# Patient Record
Sex: Female | Born: 1965 | Race: White | Hispanic: No | Smoking: Former smoker
Health system: Southern US, Community
[De-identification: ages and names within clinical notes are randomized; demographics above are authoritative.]

## PROBLEM LIST (undated history)

## (undated) DIAGNOSIS — Z9889 Other specified postprocedural states: Secondary | ICD-10-CM

## (undated) DIAGNOSIS — E7800 Pure hypercholesterolemia, unspecified: Secondary | ICD-10-CM

## (undated) DIAGNOSIS — I10 Essential (primary) hypertension: Secondary | ICD-10-CM

## (undated) DIAGNOSIS — M1990 Unspecified osteoarthritis, unspecified site: Secondary | ICD-10-CM

## (undated) DIAGNOSIS — R112 Nausea with vomiting, unspecified: Secondary | ICD-10-CM

## (undated) DIAGNOSIS — K635 Polyp of colon: Secondary | ICD-10-CM

## (undated) DIAGNOSIS — Z8669 Personal history of other diseases of the nervous system and sense organs: Secondary | ICD-10-CM

## (undated) DIAGNOSIS — Z8719 Personal history of other diseases of the digestive system: Secondary | ICD-10-CM

## (undated) DIAGNOSIS — F32A Depression, unspecified: Secondary | ICD-10-CM

## (undated) DIAGNOSIS — K219 Gastro-esophageal reflux disease without esophagitis: Secondary | ICD-10-CM

## (undated) DIAGNOSIS — Z87898 Personal history of other specified conditions: Secondary | ICD-10-CM

## (undated) DIAGNOSIS — T7840XA Allergy, unspecified, initial encounter: Secondary | ICD-10-CM

## (undated) DIAGNOSIS — B019 Varicella without complication: Secondary | ICD-10-CM

## (undated) DIAGNOSIS — Z87440 Personal history of urinary (tract) infections: Secondary | ICD-10-CM

## (undated) HISTORY — DX: Personal history of other diseases of the nervous system and sense organs: Z86.69

## (undated) HISTORY — PX: ABDOMINAL HYSTERECTOMY: SHX81

## (undated) HISTORY — DX: Personal history of other specified conditions: Z87.898

## (undated) HISTORY — DX: Essential (primary) hypertension: I10

## (undated) HISTORY — DX: Allergy, unspecified, initial encounter: T78.40XA

## (undated) HISTORY — DX: Personal history of urinary (tract) infections: Z87.440

## (undated) HISTORY — DX: Depression, unspecified: F32.A

## (undated) HISTORY — DX: Pure hypercholesterolemia, unspecified: E78.00

## (undated) HISTORY — DX: Personal history of other diseases of the digestive system: Z87.19

## (undated) HISTORY — PX: APPENDECTOMY: SHX54

## (undated) HISTORY — DX: Varicella without complication: B01.9

## (undated) HISTORY — DX: Polyp of colon: K63.5

## (undated) HISTORY — DX: Unspecified osteoarthritis, unspecified site: M19.90

---

## 1995-07-21 HISTORY — PX: PARTIAL HYSTERECTOMY: SHX80

## 1998-07-20 HISTORY — PX: REDUCTION MAMMAPLASTY: SUR839

## 1998-07-20 HISTORY — PX: BREAST REDUCTION SURGERY: SHX8

## 2005-04-13 ENCOUNTER — Emergency Department: Payer: Self-pay | Admitting: Emergency Medicine

## 2005-04-14 ENCOUNTER — Emergency Department: Payer: Self-pay | Admitting: Emergency Medicine

## 2006-04-21 ENCOUNTER — Emergency Department: Payer: Self-pay | Admitting: Internal Medicine

## 2013-11-23 ENCOUNTER — Encounter: Payer: Self-pay | Admitting: Family Medicine

## 2013-11-23 ENCOUNTER — Encounter: Payer: Self-pay | Admitting: *Deleted

## 2013-11-23 ENCOUNTER — Ambulatory Visit (INDEPENDENT_AMBULATORY_CARE_PROVIDER_SITE_OTHER): Payer: Self-pay | Admitting: Family Medicine

## 2013-11-23 VITALS — BP 132/86 | HR 80 | Temp 98.6°F | Ht 64.75 in | Wt 177.2 lb

## 2013-11-23 DIAGNOSIS — Z1231 Encounter for screening mammogram for malignant neoplasm of breast: Secondary | ICD-10-CM

## 2013-11-23 DIAGNOSIS — L989 Disorder of the skin and subcutaneous tissue, unspecified: Secondary | ICD-10-CM

## 2013-11-23 DIAGNOSIS — R635 Abnormal weight gain: Secondary | ICD-10-CM

## 2013-11-23 DIAGNOSIS — R5381 Other malaise: Secondary | ICD-10-CM

## 2013-11-23 DIAGNOSIS — R5383 Other fatigue: Secondary | ICD-10-CM

## 2013-11-23 DIAGNOSIS — K5900 Constipation, unspecified: Secondary | ICD-10-CM | POA: Insufficient documentation

## 2013-11-23 LAB — CBC WITH DIFFERENTIAL/PLATELET
Basophils Absolute: 0 10*3/uL (ref 0.0–0.1)
Basophils Relative: 0.1 % (ref 0.0–3.0)
EOS ABS: 0.2 10*3/uL (ref 0.0–0.7)
Eosinophils Relative: 2.4 % (ref 0.0–5.0)
HCT: 37.9 % (ref 36.0–46.0)
Hemoglobin: 13 g/dL (ref 12.0–15.0)
Lymphocytes Relative: 28.8 % (ref 12.0–46.0)
Lymphs Abs: 2.9 10*3/uL (ref 0.7–4.0)
MCHC: 34.2 g/dL (ref 30.0–36.0)
MCV: 91.8 fl (ref 78.0–100.0)
MONO ABS: 0.6 10*3/uL (ref 0.1–1.0)
Monocytes Relative: 5.4 % (ref 3.0–12.0)
Neutro Abs: 6.5 10*3/uL (ref 1.4–7.7)
Neutrophils Relative %: 63.3 % (ref 43.0–77.0)
PLATELETS: 364 10*3/uL (ref 150.0–400.0)
RBC: 4.12 Mil/uL (ref 3.87–5.11)
RDW: 12.7 % (ref 11.5–15.5)
WBC: 10.2 10*3/uL (ref 4.0–10.5)

## 2013-11-23 LAB — VITAMIN B12: Vitamin B-12: 548 pg/mL (ref 211–911)

## 2013-11-23 LAB — COMPREHENSIVE METABOLIC PANEL
ALBUMIN: 3.8 g/dL (ref 3.5–5.2)
ALK PHOS: 53 U/L (ref 39–117)
ALT: 25 U/L (ref 0–35)
AST: 18 U/L (ref 0–37)
BUN: 16 mg/dL (ref 6–23)
CO2: 27 mEq/L (ref 19–32)
Calcium: 9.3 mg/dL (ref 8.4–10.5)
Chloride: 103 mEq/L (ref 96–112)
Creatinine, Ser: 0.9 mg/dL (ref 0.4–1.2)
GFR: 71.72 mL/min (ref >60.00)
Glucose, Bld: 91 mg/dL (ref 70–99)
Potassium: 4.1 mEq/L (ref 3.5–5.1)
SODIUM: 136 meq/L (ref 135–145)
TOTAL PROTEIN: 7.1 g/dL (ref 6.0–8.3)
Total Bilirubin: 0.5 mg/dL (ref 0.2–1.2)

## 2013-11-23 LAB — TSH: TSH: 1.04 u[IU]/mL (ref 0.35–4.50)

## 2013-11-23 LAB — T4, FREE: Free T4: 0.7 ng/dL (ref 0.60–1.60)

## 2013-11-23 NOTE — Progress Notes (Signed)
Subjective:   Patient ID: Judy Huang, female    DOB: December 30, 1965, 48 y.o.   MRN: [id number]  Judy Huang is a pleasant 48 y.o. year old female who presents to clinic today with [place]  on 11/23/2013  HPI: Has several concerns today.  Constipation- past 6 months, having progressive constipation. Has to take a laxative to have a BM- would go 10 days if didn't take anything. Feels bloated all the time. No blood in stool, no dark stools. +nausea, no vomiting.  GERD- h/o reflux.  Nexium and tums not helping.  Weight gain- past 6 months, has gained 30 pounds.  Has not changed her diet. +fatigue, constipation, + dry skin No known family h/o thyroid.  Since she does not have insurance, has not seen a doctor in years. Mammogram over 7 years ago, per pt.  Patient Active Problem List   Diagnosis Date Noted  . Unspecified constipation 11/23/2013  . Weight gain 11/23/2013  . Other malaise and fatigue 11/23/2013  . Skin lesion of face 11/23/2013   Past Medical History  Diagnosis Date  . History of headache   . History of gastroesophageal reflux (GERD)   . History of migraine   . History of UTI    Past Surgical History  Procedure Laterality Date  . Breast reduction surgery  2000  . Partial hysterectomy  1997   History  Substance Use Topics  . Smoking status: Former Smoker    Start date: 07/26/2013  . Smokeless tobacco: Never Used  . Alcohol Use: Yes     Comment: socially (once a month)   Family History  Problem Relation Age of Onset  . Arthritis Mother   . Arthritis Father   . Arthritis Maternal Grandfather   . Arthritis Maternal Grandmother   . Lung cancer Maternal Grandfather   . Hyperlipidemia Father   . Stroke Father   . High blood pressure Father   . High blood pressure Maternal Grandmother   . Diabetes Father   . Cancer Maternal Grandmother   . Cancer Maternal Uncle    No Known Allergies No current outpatient prescriptions on file prior to  visit.   No current facility-administered medications on file prior to visit.   The PMH, PSH, Social History, Family History, Medications, and allergies have been reviewed in [facility], and have been updated if relevant.  Review of Systems See HPI +dry hair +cracked nails No difficulty swallowing   +fatigue Objective:    BP 132/86  Pulse 80  Temp(Src) 98.6 F (37 C) (Oral)  Ht 5' 4.75" (1.645 m)  Wt 177 lb 4 oz (80.4 kg)  BMI 29.71 kg/m2  SpO2 96%   Physical Exam   General:  Well-developed,well-nourished,in no acute distress; alert,appropriate and cooperative throughout examination Head:  normocephalic and atraumatic.   Eyes:  vision grossly intact, pupils equal, pupils round, and pupils reactive to light.   Ears:  R ear normal and L ear normal.   Nose:  no external deformity.   Mouth:  good dentition.   Neck:  No deformities, masses, or tenderness noted. Lungs:  Normal respiratory effort, chest expands symmetrically. Lungs are clear to auscultation, no crackles or wheezes. Heart:  Normal rate and regular rhythm. S1 and S2 normal without gallop, murmur, click, rub or other extra sounds. Abdomen:  Bowel sounds positive,abdomen soft and non-tender without masses, organomegaly or hernias noted. Msk:  No deformity or scoliosis noted of thoracic or lumbar spine.   Extremities:  No clubbing, cyanosis, edema,  or deformity noted with normal full range of motion of all joints.   Neurologic:  alert & oriented X3 and gait normal.   Skin:  Bleeding elevated lesion, left temple Cervical Nodes:  No lymphadenopathy noted Axillary Nodes:  No palpable lymphadenopathy Psych:  Cognition and judgment appear intact. Alert and cooperative with normal attention span and concentration. No apparent delusions, illusions, hallucinations       Assessment & Plan:   Unspecified constipation  Weight gain No Follow-up on file.

## 2013-11-23 NOTE — Assessment & Plan Note (Signed)
With weight gain and other symptoms of hypothyroidism. Will check labs today. Orders Placed This Encounter  Procedures  . MM Digital Screening  . CBC with Differential  . Helicobacter pylori antigen det, stool  . T4 AND TSH  . Comprehensive metabolic panel  . Vitamin B12  . Ambulatory referral to Dermatology

## 2013-11-23 NOTE — Assessment & Plan Note (Signed)
?  hypothyroidism but will also advise several dietary changes for constipation/IBS- See AVS. Will also check stool for H pylori given associated malaise and GERD.

## 2013-11-23 NOTE — Addendum Note (Signed)
Addended by: Ellamae Sia on: 11/23/2013 02:46 PM   Modules accepted: Orders

## 2013-11-23 NOTE — Patient Instructions (Signed)
Great to see you.  Please go to front desk and let them know you need to set up a referral or they will call if you if this is not an urgent referral.  Either MARION or LINDA will help you set it up.  If it is between 1-2 PM they may be at lunch.   I will call you with your lab results.  Try minimizing stress and foods that worsen symptoms.   Increase SOLUBLE fiber (from food or supplements (psyllium, etc)-These absorb water and may help either constipation or diarrhea.  Try over the counter gas-x (four times a day when necessary) or beano for bloating.  Peppermint oil can sometimes help with symptoms as well.  Common culprits are hard-to-digest carbs called "FODMAPs"...fermentable, oligo-, di-, and mono-saccharides and polyols...in some fruits, veggies, milk, grains, etc.  Exclude gas producing foods (beans, onions, celery, carrots, raisins, bananas, apricots, prunes, brussel sprouts, wheat germ, pretzels)   Consider trail of lactose free diet (back off milk)   Trial of Align or VSL #3 for bloating/IBS symptoms (OTC).

## 2013-11-23 NOTE — Progress Notes (Signed)
Pre visit review using our clinic review tool, if applicable. No additional management support is needed unless otherwise documented below in the visit note. 

## 2013-11-23 NOTE — Assessment & Plan Note (Signed)
Concerning for SCC. Refer to derm.

## 2013-11-28 ENCOUNTER — Other Ambulatory Visit: Payer: Self-pay | Admitting: Family Medicine

## 2013-11-28 NOTE — Addendum Note (Signed)
Addended by: Ellamae Sia on: 11/28/2013 12:26 PM   Modules accepted: Orders

## 2013-11-28 NOTE — Addendum Note (Signed)
Addended by: Ellamae Sia on: 11/28/2013 09:05 AM   Modules accepted: Orders

## 2013-11-29 ENCOUNTER — Other Ambulatory Visit: Payer: Self-pay | Admitting: Family Medicine

## 2013-11-29 DIAGNOSIS — K5900 Constipation, unspecified: Secondary | ICD-10-CM

## 2013-11-29 LAB — HELICOBACTER PYLORI  SPECIAL ANTIGEN: H. PYLORI Antigen: NEGATIVE

## 2014-01-17 ENCOUNTER — Telehealth: Payer: Self-pay | Admitting: Family Medicine

## 2014-01-17 NOTE — Telephone Encounter (Signed)
Have called the patient 3 times to help get her Colonoscopy scheduled. She doesn't want [name] to call her anymore, she has had surgery recently and cant afford to have the Colonoscopy at this time. She will call us when ready to schedule.

## 2015-04-24 ENCOUNTER — Other Ambulatory Visit: Payer: Self-pay | Admitting: Family Medicine

## 2015-04-24 DIAGNOSIS — Z01419 Encounter for gynecological examination (general) (routine) without abnormal findings: Secondary | ICD-10-CM | POA: Insufficient documentation

## 2015-05-01 ENCOUNTER — Other Ambulatory Visit (INDEPENDENT_AMBULATORY_CARE_PROVIDER_SITE_OTHER): Payer: Self-pay

## 2015-05-01 DIAGNOSIS — Z0000 Encounter for general adult medical examination without abnormal findings: Secondary | ICD-10-CM

## 2015-05-01 DIAGNOSIS — Z01419 Encounter for gynecological examination (general) (routine) without abnormal findings: Secondary | ICD-10-CM

## 2015-05-01 LAB — COMPREHENSIVE METABOLIC PANEL
ALBUMIN: 4 g/dL (ref 3.5–5.2)
ALK PHOS: 57 U/L (ref 39–117)
ALT: 16 U/L (ref 0–35)
AST: 14 U/L (ref 0–37)
BUN: 14 mg/dL (ref 6–23)
CO2: 27 mEq/L (ref 19–32)
Calcium: 9.7 mg/dL (ref 8.4–10.5)
Chloride: 106 mEq/L (ref 96–112)
Creatinine, Ser: 0.78 mg/dL (ref 0.40–1.20)
GFR: 83.02 mL/min (ref >60.00)
Glucose, Bld: 93 mg/dL (ref 70–99)
Potassium: 4.4 mEq/L (ref 3.5–5.1)
Sodium: 138 mEq/L (ref 135–145)
TOTAL PROTEIN: 6.9 g/dL (ref 6.0–8.3)
Total Bilirubin: 0.2 mg/dL (ref 0.2–1.2)

## 2015-05-01 LAB — CBC WITH DIFFERENTIAL/PLATELET
Basophils Absolute: 0 10*3/uL (ref 0.0–0.1)
Basophils Relative: 0.5 % (ref 0.0–3.0)
EOS PCT: 2.2 % (ref 0.0–5.0)
Eosinophils Absolute: 0.2 10*3/uL (ref 0.0–0.7)
HCT: 39.7 % (ref 36.0–46.0)
HEMOGLOBIN: 13.4 g/dL (ref 12.0–15.0)
LYMPHS PCT: 27.5 % (ref 12.0–46.0)
Lymphs Abs: 2.5 10*3/uL (ref 0.7–4.0)
MCHC: 33.8 g/dL (ref 30.0–36.0)
MCV: 92 fl (ref 78.0–100.0)
Monocytes Absolute: 0.6 10*3/uL (ref 0.1–1.0)
Monocytes Relative: 6.8 % (ref 3.0–12.0)
Neutro Abs: 5.7 10*3/uL (ref 1.4–7.7)
Neutrophils Relative %: 63 % (ref 43.0–77.0)
Platelets: 341 10*3/uL (ref 150.0–400.0)
RBC: 4.32 Mil/uL (ref 3.87–5.11)
RDW: 12.7 % (ref 11.5–15.5)
WBC: 9 10*3/uL (ref 4.0–10.5)

## 2015-05-01 LAB — LIPID PANEL
CHOLESTEROL: 175 mg/dL (ref 0–200)
HDL: 40 mg/dL (ref >39.00)
LDL CALC: 117 mg/dL — AB (ref 0–99)
NonHDL: 135.34
TRIGLYCERIDES: 94 mg/dL (ref 0.0–149.0)
Total CHOL/HDL Ratio: 4
VLDL: 18.8 mg/dL (ref 0.0–40.0)

## 2015-05-01 LAB — TSH: TSH: 2.17 u[IU]/mL (ref 0.35–4.50)

## 2015-05-06 ENCOUNTER — Encounter: Payer: Self-pay | Admitting: Family Medicine

## 2015-05-06 ENCOUNTER — Ambulatory Visit (INDEPENDENT_AMBULATORY_CARE_PROVIDER_SITE_OTHER): Payer: Self-pay | Admitting: Family Medicine

## 2015-05-06 ENCOUNTER — Other Ambulatory Visit: Payer: Self-pay | Admitting: Family Medicine

## 2015-05-06 VITALS — BP 132/88 | HR 75 | Temp 98.3°F | Ht 64.75 in | Wt 170.8 lb

## 2015-05-06 DIAGNOSIS — R5383 Other fatigue: Secondary | ICD-10-CM

## 2015-05-06 DIAGNOSIS — Z01419 Encounter for gynecological examination (general) (routine) without abnormal findings: Secondary | ICD-10-CM

## 2015-05-06 DIAGNOSIS — F418 Other specified anxiety disorders: Secondary | ICD-10-CM | POA: Insufficient documentation

## 2015-05-06 DIAGNOSIS — F419 Anxiety disorder, unspecified: Secondary | ICD-10-CM | POA: Insufficient documentation

## 2015-05-06 DIAGNOSIS — F32A Depression, unspecified: Secondary | ICD-10-CM

## 2015-05-06 DIAGNOSIS — F329 Major depressive disorder, single episode, unspecified: Secondary | ICD-10-CM

## 2015-05-06 DIAGNOSIS — K5900 Constipation, unspecified: Secondary | ICD-10-CM

## 2015-05-06 DIAGNOSIS — Z0000 Encounter for general adult medical examination without abnormal findings: Secondary | ICD-10-CM

## 2015-05-06 DIAGNOSIS — Z1211 Encounter for screening for malignant neoplasm of colon: Secondary | ICD-10-CM

## 2015-05-06 MED ORDER — CITALOPRAM HYDROBROMIDE 20 MG PO TABS: 20.0000 mg | ORAL_TABLET | Freq: Every day | ORAL | Status: DC

## 2015-05-06 NOTE — Assessment & Plan Note (Signed)
New- discussed tx options. Refusing psychotherapy. Does want to try rx.  eRx sent for celexa 20 mg daily- advised ok to cut in half and take 10 mg daily for first few days. She will call me in 3-4 weeks with an update.

## 2015-05-06 NOTE — Assessment & Plan Note (Signed)
Likely depression related.

## 2015-05-06 NOTE — Progress Notes (Signed)
Pre visit review using our clinic review tool, if applicable. No additional management support is needed unless otherwise documented below in the visit note. 

## 2015-05-06 NOTE — Assessment & Plan Note (Signed)
Reviewed preventive care protocols, scheduled due services, and updated immunizations Discussed nutrition, exercise, diet, and healthy lifestyle.  Declines colonoscopy- agrees to IFOB- order entered. Mammogram order and number given to pt to call breast center to set this up.

## 2015-05-06 NOTE — Progress Notes (Signed)
Subjective:   Patient ID: Judy Huang, female    DOB: [birth year], 49 y.o.   MRN: [id number]  Judy Huang is a pleasant 49 y.o. year old female who presents to clinic today with Annual Exam; Depression; and Fatigue  on 05/06/2015  HPI:   I have not seen pt since she established care with me in 11/2013.  Remote h/o hysterectomy. Has not had a colonoscopy. Has gyn- overdue for mammogram.  ?depression- past year, worsening symptoms of depression.  More tearful, cannot concentrate and not sleeping well.  Appetite "too good."  Denies SI or HI. Has never taken anything for depression.  Symptoms started when her daughter left for college and are now worsening since her mother has been very ill- in and out of the hospital. Lab Results  Component Value Date   WBC 9.0 05/01/2015   HGB 13.4 05/01/2015   HCT 39.7 05/01/2015   MCV 92.0 05/01/2015   PLT 341.0 05/01/2015   Lab Results  Component Value Date   TSH 2.17 05/01/2015   Lab Results  Component Value Date   ALT 16 05/01/2015   AST 14 05/01/2015   ALKPHOS 57 05/01/2015   BILITOT 0.2 05/01/2015   Lab Results  Component Value Date   NA 138 05/01/2015   K 4.4 05/01/2015   CL 106 05/01/2015   CO2 27 05/01/2015   Lab Results  Component Value Date   CHOL 175 05/01/2015   HDL 40.00 05/01/2015   LDLCALC 117* 05/01/2015   TRIG 94.0 05/01/2015   CHOLHDL 4 05/01/2015   Lab Results  Component Value Date   CREATININE 0.78 05/01/2015     Patient Active Problem List   Diagnosis Date Noted  . Fatigue 05/06/2015  . Depression 05/06/2015  . Well woman exam 04/24/2015   Past Medical History  Diagnosis Date  . History of headache   . History of gastroesophageal reflux (GERD)   . History of migraine   . History of UTI    Past Surgical History  Procedure Laterality Date  . Breast reduction surgery  2000  . Partial hysterectomy  1997   Social History  Substance Use Topics  . Smoking status: Former Smoker   Start date: 07/26/2013  . Smokeless tobacco: Never Used  . Alcohol Use: Yes     Comment: socially (once a month)   Family History  Problem Relation Age of Onset  . Arthritis Mother   . Arthritis Father   . Arthritis Maternal Grandfather   . Arthritis Maternal Grandmother   . Lung cancer Maternal Grandfather   . Hyperlipidemia Father   . Stroke Father   . High blood pressure Father   . High blood pressure Maternal Grandmother   . Diabetes Father   . Cancer Maternal Grandmother   . Cancer Maternal Uncle    No Known Allergies Current Outpatient Prescriptions on File Prior to Visit  Medication Sig Dispense Refill  . BIOTIN PO Take by mouth 2 (two) times a week.    . Multiple Vitamin (MULTIVITAMIN) capsule Take 1 capsule by mouth daily.     No current facility-administered medications on file prior to visit.   The PMH, PSH, Social History, Family History, Medications, and allergies have been reviewed in [facility], and have been updated if relevant.  Review of Systems  Constitutional: Positive for fatigue and unexpected weight change.  HENT: Negative.   Eyes: Negative.   Respiratory: Negative.   Cardiovascular: Negative.   Gastrointestinal: Negative.   Endocrine:  Negative.   Genitourinary: Negative.   Musculoskeletal: Negative.   Skin: Negative.   Allergic/Immunologic: Negative.   Neurological: Negative.   Psychiatric/Behavioral: Positive for sleep disturbance, dysphoric mood and decreased concentration. Negative for suicidal ideas, hallucinations, behavioral problems, self-injury and agitation. The patient is nervous/anxious. The patient is not hyperactive.   All other systems reviewed and are negative.   Objective:    BP 132/88 mmHg  Pulse 75  Temp(Src) 98.3 F (36.8 C) (Oral)  Ht 5' 4.75" (1.645 m)  Wt 170 lb 12 oz (77.452 kg)  BMI 28.62 kg/m2  SpO2 97%  Wt Readings from Last 3 Encounters:  05/06/15 170 lb 12 oz (77.452 kg)  11/23/13 177 lb 4 oz (80.4 kg)     Physical Exam   General:  Well-developed,well-nourished,in no acute distress; alert,appropriate and cooperative throughout examination Head:  normocephalic and atraumatic.   Eyes:  vision grossly intact, pupils equal, pupils round, and pupils reactive to light.   Ears:  R ear normal and L ear normal.   Nose:  no external deformity.   Mouth:  good dentition.   Neck:  No deformities, masses, or tenderness noted. Lungs:  Normal respiratory effort, chest expands symmetrically. Lungs are clear to auscultation, no crackles or wheezes. Heart:  Normal rate and regular rhythm. S1 and S2 normal without gallop, murmur, click, rub or other extra sounds. Abdomen:  Bowel sounds positive,abdomen soft and non-tender without masses, organomegaly or hernias noted. Msk:  No deformity or scoliosis noted of thoracic or lumbar spine.   Extremities:  No clubbing, cyanosis, edema, or deformity noted with normal full range of motion of all joints.   Neurologic:  alert & oriented X3 and gait normal.   Cervical Nodes:  No lymphadenopathy noted Axillary Nodes:  No palpable lymphadenopathy Psych:  Cognition and judgment appear intact. Alert and cooperative with normal attention span and concentration. No apparent delusions, illusions, hallucinations       Assessment & Plan:   Well woman exam  Constipation, unspecified constipation type  Other fatigue  Depression  Special screening for malignant neoplasms, colon - Plan: Fecal occult blood, imunochemical No Follow-up on file.

## 2015-05-06 NOTE — Patient Instructions (Addendum)
Great to see you. Please call to set up your mammogram.  Please pick up your stool cards in the lab.  We are starting Celexa 20 mg daily- ok to cut tablet in half for first few days and take 10 mg daily.  Please call me in 3-4 weeks with an update.

## 2015-05-10 ENCOUNTER — Other Ambulatory Visit (INDEPENDENT_AMBULATORY_CARE_PROVIDER_SITE_OTHER): Payer: Self-pay

## 2015-05-10 DIAGNOSIS — Z1211 Encounter for screening for malignant neoplasm of colon: Secondary | ICD-10-CM

## 2015-05-10 LAB — FECAL OCCULT BLOOD, IMMUNOCHEMICAL: FECAL OCCULT BLD: NEGATIVE

## 2015-05-13 ENCOUNTER — Encounter: Payer: Self-pay | Admitting: *Deleted

## 2015-05-15 ENCOUNTER — Ambulatory Visit: Payer: Self-pay | Attending: Oncology

## 2015-05-15 ENCOUNTER — Ambulatory Visit
Admission: RE | Admit: 2015-05-15 | Discharge: 2015-05-15 | Disposition: A | Discharge status: Home / Self Care | Payer: Self-pay | Source: Ambulatory Visit | Attending: Oncology | Admitting: Oncology

## 2015-05-15 VITALS — BP 123/86 | HR 65 | Temp 97.0°F | Ht 65.0 in | Wt 168.2 lb

## 2015-05-15 DIAGNOSIS — Z1231 Encounter for screening mammogram for malignant neoplasm of breast: Secondary | ICD-10-CM | POA: Insufficient documentation

## 2015-05-15 DIAGNOSIS — Z0000 Encounter for general adult medical examination without abnormal findings: Secondary | ICD-10-CM

## 2015-05-15 NOTE — Progress Notes (Signed)
Subjective:     Patient ID: Judy Huang, female   DOB: 03-27-1966, 49 y.o.   MRN: [id number]  HPI   Review of Systems     Objective:   Physical Exam  Pulmonary/Chest: Right breast exhibits tenderness. Right breast exhibits no inverted nipple, no mass, no nipple discharge and no skin change. Left breast exhibits tenderness. Left breast exhibits no inverted nipple, no mass, no nipple discharge and no skin change. Breasts are symmetrical.    Bilateral breast reduction.  Cyclical generalized breast tenderness bilateral.       Assessment:     49 year old patient presents for [facility] clinic visit.  Patient to return in one year for annual screening.  Copy to [facility].  CBE unremarkable.  Instructed patient on breast self-exam using teach back method.     Plan:     Sent for bilateral screening mammogram.

## 2015-05-21 NOTE — Progress Notes (Signed)
Letter mailed from [facility] to notify of normal mammogram results.  Patient to return in one year for annual screening.  Copy to [facility]. 

## 2015-06-10 ENCOUNTER — Other Ambulatory Visit: Payer: Self-pay | Admitting: *Deleted

## 2015-06-10 MED ORDER — CITALOPRAM HYDROBROMIDE 20 MG PO TABS: 20.0000 mg | ORAL_TABLET | Freq: Every day | ORAL | Status: DC

## 2015-06-10 NOTE — Telephone Encounter (Signed)
Rx resent to pharmacy for 90D at request of pharmacy. Pt is self pay and 90D Rx is more economical

## 2015-06-20 ENCOUNTER — Encounter: Payer: Self-pay | Admitting: Primary Care

## 2015-06-20 ENCOUNTER — Ambulatory Visit (INDEPENDENT_AMBULATORY_CARE_PROVIDER_SITE_OTHER): Payer: Self-pay | Admitting: Primary Care

## 2015-06-20 VITALS — BP 130/88 | HR 76 | Temp 98.2°F | Ht 64.75 in | Wt 172.1 lb

## 2015-06-20 DIAGNOSIS — R05 Cough: Secondary | ICD-10-CM

## 2015-06-20 DIAGNOSIS — R059 Cough, unspecified: Secondary | ICD-10-CM

## 2015-06-20 MED ORDER — HYDROCODONE-HOMATROPINE 5-1.5 MG/5ML PO SYRP: 5.0000 mL | ORAL_SOLUTION | Freq: Every evening | ORAL | Status: DC | PRN

## 2015-06-20 MED ORDER — BENZONATATE 200 MG PO CAPS: 200.0000 mg | ORAL_CAPSULE | Freq: Three times a day (TID) | ORAL | Status: DC | PRN

## 2015-06-20 NOTE — Progress Notes (Signed)
Subjective:    Patient ID: Judy Huang, female    DOB: [birth year], 49 y.o.   MRN: [id number]  HPI  Judy Huang is a 49 year old female who presents today with a chief complaint of cough. She also reports wheezing, shortness of breath, body aches, ear fullness and pain. Her symptoms have been present since Monday this week. Denies fevers, nausea, sore throat. She's taken Nyquil, thera-flu, cough drops, Mucinex with temporary relief.   Review of Systems  Constitutional: Negative for fever and chills.  HENT: Positive for congestion and ear pain. Negative for sore throat.   Respiratory: Positive for cough and wheezing.   Cardiovascular: Negative for chest pain.  Musculoskeletal: Positive for myalgias.       Past Medical History  Diagnosis Date  . History of headache   . History of gastroesophageal reflux (GERD)   . History of migraine   . History of UTI     Social History   Social History  . Marital Status: Single    Spouse Name: N/A  . Number of Children: N/A  . Years of Education: N/A   Occupational History  . Not on file.   Social History Main Topics  . Smoking status: Former Smoker    Start date: 07/26/2013  . Smokeless tobacco: Never Used  . Alcohol Use: Yes     Comment: socially (once a month)  . Drug Use: No  . Sexual Activity: Not on file   Other Topics Concern  . Not on file   Social History Narrative    Past Surgical History  Procedure Laterality Date  . Breast reduction surgery  2000  . Partial hysterectomy  1997  . Reduction mammaplasty Bilateral 2000    BILAT    Family History  Problem Relation Age of Onset  . Arthritis Mother   . Arthritis Father   . Hyperlipidemia Father   . Stroke Father   . High blood pressure Father   . Diabetes Father   . Arthritis Maternal Grandfather   . Lung cancer Maternal Grandfather   . Arthritis Maternal Grandmother   . High blood pressure Maternal Grandmother   . Cancer Maternal Grandmother   . Cancer  Maternal Uncle   . Breast cancer Neg Hx     No Known Allergies  Current Outpatient Prescriptions on File Prior to Visit  Medication Sig Dispense Refill  . BIOTIN PO Take by mouth 2 (two) times a week.    . citalopram (CELEXA) 20 MG tablet Take 1 tablet (20 mg total) by mouth daily. 90 tablet 1  . Multiple Vitamin (MULTIVITAMIN) capsule Take 1 capsule by mouth daily.     No current facility-administered medications on file prior to visit.    BP 130/88 mmHg  Pulse 76  Temp(Src) 98.2 F (36.8 C) (Oral)  Ht 5' 4.75" (1.645 m)  Wt 172 lb 1.9 oz (78.073 kg)  BMI 28.85 kg/m2  SpO2 98%    Objective:   Physical Exam  Constitutional: She appears well-nourished.  HENT:  Right Ear: Tympanic membrane and ear canal normal.  Left Ear: Tympanic membrane and ear canal normal.  Nose: Nose normal.  Mouth/Throat: Oropharynx is clear and moist.  Eyes: Conjunctivae are normal. Pupils are equal, round, and reactive to light.  Neck: Neck supple.  Cardiovascular: Normal rate and regular rhythm.   Pulmonary/Chest: Effort normal and breath sounds normal. She has no wheezes. She has no rales.  Lymphadenopathy:    She has no cervical adenopathy.  Skin: Skin is warm and dry.          Assessment & Plan:  URI:  Cough, nasal congestion, ear fullness, body aches x 3 days. Temporary improvement with OTC's. Has not tried Fluticasone. Exam unremarkable. Lungs clear. Suspect viral URI at this point. Treat with supportive measures. Flonase, Mucinex, increase fluids, rest. RX provided for tessalon pearls for day cough and Hycodan HS PRN. She is to call Monday if no improvement.

## 2015-06-20 NOTE — Progress Notes (Signed)
Pre visit review using our clinic review tool, if applicable. No additional management support is needed unless otherwise documented below in the visit note. 

## 2015-06-20 NOTE — Patient Instructions (Signed)
You may take Benzonatate cough pills three times daily as needed for day cough.  You may take the Hycodan at bedtime as needed for night cough.  Please call me Monday if you're feeling no better.  Increase fluids, rest. Continue Mucinex. Try Flonase (fluticasone) nasal spray for nasal congestion. Instill 2 sprays in each nostril once daily.  It was a pleasure to see you today!  Upper Respiratory Infection, Adult Most upper respiratory infections (URIs) are a viral infection of the air passages leading to the lungs. A URI affects the nose, throat, and upper air passages. The most common type of URI is nasopharyngitis and is typically referred to as "the common cold." URIs run their course and usually go away on their own. Most of the time, a URI does not require medical attention, but sometimes a bacterial infection in the upper airways can follow a viral infection. This is called a secondary infection. Sinus and middle ear infections are common types of secondary upper respiratory infections. Bacterial pneumonia can also complicate a URI. A URI can worsen asthma and chronic obstructive pulmonary disease (COPD). Sometimes, these complications can require emergency medical care and may be life threatening.  CAUSES Almost all URIs are caused by viruses. A virus is a type of germ and can spread from one person to another.  RISKS FACTORS You may be at risk for a URI if:   You smoke.   You have chronic heart or lung disease.  You have a weakened defense (immune) system.   You are very young or very old.   You have nasal allergies or asthma.  You work in crowded or poorly ventilated areas.  You work in health care facilities or schools. SIGNS AND SYMPTOMS  Symptoms typically develop 2-3 days after you come in contact with a cold virus. Most viral URIs last 7-10 days. However, viral URIs from the influenza virus (flu virus) can last 14-18 days and are typically more severe. Symptoms may  include:   Runny or stuffy (congested) nose.   Sneezing.   Cough.   Sore throat.   Headache.   Fatigue.   Fever.   Loss of appetite.   Pain in your forehead, behind your eyes, and over your cheekbones (sinus pain).  Muscle aches.  DIAGNOSIS  Your health care provider may diagnose a URI by:  Physical exam.  Tests to check that your symptoms are not due to another condition such as:  Strep throat.  Sinusitis.  Pneumonia.  Asthma. TREATMENT  A URI goes away on its own with time. It cannot be cured with medicines, but medicines may be prescribed or recommended to relieve symptoms. Medicines may help:  Reduce your fever.  Reduce your cough.  Relieve nasal congestion. HOME CARE INSTRUCTIONS   Take medicines only as directed by your health care provider.   Gargle warm saltwater or take cough drops to comfort your throat as directed by your health care provider.  Use a warm mist humidifier or inhale steam from a shower to increase air moisture. This may make it easier to breathe.  Drink enough fluid to keep your urine clear or pale yellow.   Eat soups and other clear broths and maintain good nutrition.   Rest as needed.   Return to work when your temperature has returned to normal or as your health care provider advises. You may need to stay home longer to avoid infecting others. You can also use a face mask and careful hand washing to  prevent spread of the virus.  Increase the usage of your inhaler if you have asthma.   Do not use any tobacco products, including cigarettes, chewing tobacco, or electronic cigarettes. If you need help quitting, ask your health care provider. PREVENTION  The best way to protect yourself from getting a cold is to practice good hygiene.   Avoid oral or hand contact with people with cold symptoms.   Wash your hands often if contact occurs.  There is no clear evidence that vitamin C, vitamin E, echinacea, or  exercise reduces the chance of developing a cold. However, it is always recommended to get plenty of rest, exercise, and practice good nutrition.  SEEK MEDICAL CARE IF:   You are getting worse rather than better.   Your symptoms are not controlled by medicine.   You have chills.  You have worsening shortness of breath.  You have brown or red mucus.  You have yellow or brown nasal discharge.  You have pain in your face, especially when you bend forward.  You have a fever.  You have swollen neck glands.  You have pain while swallowing.  You have white areas in the back of your throat. SEEK IMMEDIATE MEDICAL CARE IF:   You have severe or persistent:  Headache.  Ear pain.  Sinus pain.  Chest pain.  You have chronic lung disease and any of the following:  Wheezing.  Prolonged cough.  Coughing up blood.  A change in your usual mucus.  You have a stiff neck.  You have changes in your:  Vision.  Hearing.  Thinking.  Mood. MAKE SURE YOU:   Understand these instructions.  Will watch your condition.  Will get help right away if you are not doing well or get worse.   This information is not intended to replace advice given to you by your health care provider. Make sure you discuss any questions you have with your health care provider.   Document Released: 12/30/2000 Document Revised: 11/20/2014 Document Reviewed: 10/11/2013 Elsevier Interactive Patient Education [employer].

## 2015-06-24 ENCOUNTER — Telehealth: Payer: Self-pay

## 2015-06-24 DIAGNOSIS — J329 Chronic sinusitis, unspecified: Secondary | ICD-10-CM

## 2015-06-24 MED ORDER — AMOXICILLIN-POT CLAVULANATE 875-125 MG PO TABS: 1.0000 | ORAL_TABLET | Freq: Two times a day (BID) | ORAL | Status: DC

## 2015-06-24 NOTE — Telephone Encounter (Signed)
Noted. Will send in Augmentin antibiotics. She will take 1 tablet by mouth twice daily for 10 days.

## 2015-06-24 NOTE — Telephone Encounter (Signed)
Pt left v/m; pt was seen 06/20/15; pt is no better, pt thinks has sinus infection, when blows nose has green mucus. Non prod cough is not any better; pt taking mucinex. Pt feels hot then cold, has not taken temp.wheezing is same as when seen on 06/20/15. Pt request abx to [facility]. Pt request cb.

## 2015-06-24 NOTE — Telephone Encounter (Signed)
Called and notified patient of Judy Huang's comments. Patient verbalized understanding.  

## 2015-10-16 DIAGNOSIS — Z85828 Personal history of other malignant neoplasm of skin: Secondary | ICD-10-CM | POA: Insufficient documentation

## 2016-03-25 ENCOUNTER — Other Ambulatory Visit: Payer: Self-pay | Admitting: Family Medicine

## 2016-03-31 ENCOUNTER — Other Ambulatory Visit: Payer: Self-pay | Admitting: Family Medicine

## 2016-06-10 ENCOUNTER — Other Ambulatory Visit: Payer: Self-pay | Admitting: Family Medicine

## 2016-07-13 ENCOUNTER — Other Ambulatory Visit: Payer: Self-pay | Admitting: Family Medicine

## 2016-08-20 ENCOUNTER — Other Ambulatory Visit: Payer: Self-pay | Admitting: Family Medicine

## 2016-08-24 ENCOUNTER — Other Ambulatory Visit: Payer: Self-pay | Admitting: Family Medicine

## 2016-09-03 ENCOUNTER — Other Ambulatory Visit: Payer: Self-pay | Admitting: Family Medicine

## 2016-09-03 DIAGNOSIS — Z01419 Encounter for gynecological examination (general) (routine) without abnormal findings: Secondary | ICD-10-CM

## 2016-09-04 ENCOUNTER — Other Ambulatory Visit (INDEPENDENT_AMBULATORY_CARE_PROVIDER_SITE_OTHER): Payer: Self-pay

## 2016-09-04 DIAGNOSIS — Z01419 Encounter for gynecological examination (general) (routine) without abnormal findings: Secondary | ICD-10-CM

## 2016-09-04 LAB — CBC WITH DIFFERENTIAL/PLATELET
BASOS ABS: 0 10*3/uL (ref 0.0–0.1)
Basophils Relative: 0.5 % (ref 0.0–3.0)
Eosinophils Absolute: 0.2 10*3/uL (ref 0.0–0.7)
Eosinophils Relative: 3.2 % (ref 0.0–5.0)
HEMATOCRIT: 40.2 % (ref 36.0–46.0)
HEMOGLOBIN: 13.4 g/dL (ref 12.0–15.0)
LYMPHS PCT: 32.7 % (ref 12.0–46.0)
Lymphs Abs: 2.5 10*3/uL (ref 0.7–4.0)
MCHC: 33.4 g/dL (ref 30.0–36.0)
MCV: 92.1 fl (ref 78.0–100.0)
MONOS PCT: 6.3 % (ref 3.0–12.0)
Monocytes Absolute: 0.5 10*3/uL (ref 0.1–1.0)
Neutro Abs: 4.4 10*3/uL (ref 1.4–7.7)
Neutrophils Relative %: 57.3 % (ref 43.0–77.0)
Platelets: 368 10*3/uL (ref 150.0–400.0)
RBC: 4.37 Mil/uL (ref 3.87–5.11)
RDW: 13 % (ref 11.5–15.5)
WBC: 7.7 10*3/uL (ref 4.0–10.5)

## 2016-09-04 LAB — COMPREHENSIVE METABOLIC PANEL
ALBUMIN: 4.4 g/dL (ref 3.5–5.2)
ALK PHOS: 62 U/L (ref 39–117)
ALT: 35 U/L (ref 0–35)
AST: 21 U/L (ref 0–37)
BILIRUBIN TOTAL: 0.3 mg/dL (ref 0.2–1.2)
BUN: 12 mg/dL (ref 6–23)
CALCIUM: 9.9 mg/dL (ref 8.4–10.5)
CO2: 28 meq/L (ref 19–32)
Chloride: 107 mEq/L (ref 96–112)
Creatinine, Ser: 0.91 mg/dL (ref 0.40–1.20)
GFR: 69.12 mL/min (ref >60.00)
Glucose, Bld: 97 mg/dL (ref 70–99)
Potassium: 4.9 mEq/L (ref 3.5–5.1)
Sodium: 139 mEq/L (ref 135–145)
TOTAL PROTEIN: 7.5 g/dL (ref 6.0–8.3)

## 2016-09-04 LAB — TSH: TSH: 2.4 u[IU]/mL (ref 0.35–4.50)

## 2016-09-04 LAB — LIPID PANEL
CHOLESTEROL: 214 mg/dL — AB (ref 0–200)
HDL: 39.6 mg/dL (ref >39.00)
LDL Cholesterol: 143 mg/dL — ABNORMAL HIGH (ref 0–99)
NonHDL: 174.63
TRIGLYCERIDES: 158 mg/dL — AB (ref 0.0–149.0)
Total CHOL/HDL Ratio: 5
VLDL: 31.6 mg/dL (ref 0.0–40.0)

## 2016-09-10 ENCOUNTER — Ambulatory Visit (INDEPENDENT_AMBULATORY_CARE_PROVIDER_SITE_OTHER): Payer: Self-pay | Admitting: Family Medicine

## 2016-09-10 ENCOUNTER — Encounter: Payer: Self-pay | Admitting: Family Medicine

## 2016-09-10 VITALS — BP 116/80 | HR 88 | Temp 97.9°F | Wt 177.0 lb

## 2016-09-10 DIAGNOSIS — Z1211 Encounter for screening for malignant neoplasm of colon: Secondary | ICD-10-CM

## 2016-09-10 DIAGNOSIS — Z01419 Encounter for gynecological examination (general) (routine) without abnormal findings: Secondary | ICD-10-CM

## 2016-09-10 DIAGNOSIS — N951 Menopausal and female climacteric states: Secondary | ICD-10-CM | POA: Insufficient documentation

## 2016-09-10 DIAGNOSIS — F32A Depression, unspecified: Secondary | ICD-10-CM

## 2016-09-10 DIAGNOSIS — F329 Major depressive disorder, single episode, unspecified: Secondary | ICD-10-CM

## 2016-09-10 MED ORDER — ESCITALOPRAM OXALATE 20 MG PO TABS: ORAL_TABLET | ORAL | 3 refills | Status: DC

## 2016-09-10 NOTE — Patient Instructions (Signed)
Great to see you. We are starting Lexapro 20 mg daily- ok to take 1/2 tablet daily for 7 days and then a full tablet daily.  Have fun at Judy Huang ([place])!

## 2016-09-10 NOTE — Progress Notes (Signed)
Subjective:   Patient ID: Judy Huang, female    DOB: 13-Jan-1966, 51 y.o.   MRN: [id number]  Judy Huang is a pleasant 51 y.o. year old female who presents to clinic today with Annual Exam (No Pap/Had Hysterectomy); Stress; and Menopausal Symptoms (Hot flashes, weight gain, mood swings, fatigue, ankle swelling)  on 09/10/2016  HPI:  Remote h/o hysterectomy Mammogram 09/14/14  ?menopause- remote h/o hysterectomy, still has ovaries. Hot flashes, insomnia, mood swings no vaginal dryness.  Under more stress- ex husband died unexpectedly, more work stressors.   Current Outpatient Prescriptions on File Prior to Visit  Medication Sig Dispense Refill  . Multiple Vitamin (MULTIVITAMIN) capsule Take 1 capsule by mouth daily.     No current facility-administered medications on file prior to visit.     No Known Allergies  Past Medical History:  Diagnosis Date  . History of gastroesophageal reflux (GERD)   . History of headache   . History of migraine   . History of UTI     Past Surgical History:  Procedure Laterality Date  . BREAST REDUCTION SURGERY  2000  . PARTIAL HYSTERECTOMY  1997  . REDUCTION MAMMAPLASTY Bilateral 2000   BILAT    Family History  Problem Relation Age of Onset  . Arthritis Mother   . Arthritis Father   . Hyperlipidemia Father   . Stroke Father   . High blood pressure Father   . Diabetes Father   . Arthritis Maternal Grandfather   . Lung cancer Maternal Grandfather   . Arthritis Maternal Grandmother   . High blood pressure Maternal Grandmother   . Cancer Maternal Grandmother   . Cancer Maternal Uncle   . Breast cancer Neg Hx     Social History   Social History  . Marital status: Single    Spouse name: N/A  . Number of children: N/A  . Years of education: N/A   Occupational History  . Not on file.   Social History Main Topics  . Smoking status: Former Smoker    Start date: 07/26/2013  . Smokeless tobacco: Never Used  . Alcohol  use Yes     Comment: socially (once a month)  . Drug use: No  . Sexual activity: Not on file   Other Topics Concern  . Not on file   Social History Narrative  . No narrative on file   The PMH, PSH, Social History, Family History, Medications, and allergies have been reviewed in [facility], and have been updated if relevant.  Review of Systems  Constitutional: Negative.   HENT: Negative.   Eyes: Negative.   Respiratory: Negative.   Cardiovascular: Negative.   Gastrointestinal: Negative.   Endocrine: Positive for heat intolerance.  Genitourinary: Negative.   Musculoskeletal: Negative.   Skin: Negative.   Allergic/Immunologic: Negative.   Hematological: Negative.   Psychiatric/Behavioral: Positive for sleep disturbance. Negative for decreased concentration, dysphoric mood, self-injury and suicidal ideas. The patient is nervous/anxious. The patient is not hyperactive.   All other systems reviewed and are negative.      Objective:    BP 116/80 (BP Location: Right Arm, Patient Position: Sitting, Cuff Size: Large)   Pulse 88   Temp 97.9 F (36.6 C) (Oral)   Wt 177 lb (80.3 kg)   SpO2 97%   BMI 29.68 kg/m    Physical Exam   General:  Well-developed,well-nourished,in no acute distress; alert,appropriate and cooperative throughout examination Head:  normocephalic and atraumatic.   Eyes:  vision grossly intact, PERRL  Ears:  R ear normal and L ear normal externally, TMs clear bilaterally Nose:  no external deformity.   Mouth:  good dentition.   Neck:  No deformities, masses, or tenderness noted. Breasts:  No mass, nodules, thickening, tenderness, bulging, retraction, inflamation, nipple discharge or skin changes noted.   Lungs:  Normal respiratory effort, chest expands symmetrically. Lungs are clear to auscultation, no crackles or wheezes. Heart:  Normal rate and regular rhythm. S1 and S2 normal without gallop, murmur, click, rub or other extra sounds. Abdomen:  Bowel sounds  positive,abdomen soft and non-tender without masses, organomegaly or hernias noted. Msk:  No deformity or scoliosis noted of thoracic or lumbar spine.   Extremities:  No clubbing, cyanosis, edema, or deformity noted with normal full range of motion of all joints.   Neurologic:  alert & oriented X3 and gait normal.   Skin:  Intact without suspicious lesions or rashes Cervical Nodes:  No lymphadenopathy noted Axillary Nodes:  No palpable lymphadenopathy Psych:  Cognition and judgment appear intact. Alert and cooperative with normal attention span and concentration. No apparent delusions, illusions, hallucinations       Assessment & Plan:   Well woman exam  Depression, unspecified depression type  Screening for colon cancer - Plan: Fecal occult blood, imunochemical, CANCELED: Fecal occult blood, imunochemical  Menopausal symptoms No Follow-up on file.

## 2016-09-10 NOTE — Assessment & Plan Note (Signed)
Reviewed preventive care protocols, scheduled due services, and updated immunizations Discussed nutrition, exercise, diet, and healthy lifestyle.  

## 2016-09-10 NOTE — Progress Notes (Signed)
Pre visit review using our clinic review tool, if applicable. No additional management support is needed unless otherwise documented below in the visit note. 

## 2016-09-10 NOTE — Assessment & Plan Note (Signed)
New- with worsening symptoms of depression. eRx sent for lexapro 20 mg daily She will update me with her symptoms.

## 2016-09-18 ENCOUNTER — Other Ambulatory Visit (INDEPENDENT_AMBULATORY_CARE_PROVIDER_SITE_OTHER): Payer: Self-pay

## 2016-09-18 DIAGNOSIS — Z1211 Encounter for screening for malignant neoplasm of colon: Secondary | ICD-10-CM

## 2016-09-21 LAB — FECAL OCCULT BLOOD, IMMUNOCHEMICAL: FECAL OCCULT BLD: NEGATIVE

## 2016-10-07 ENCOUNTER — Other Ambulatory Visit: Payer: Self-pay | Admitting: Family Medicine

## 2016-10-07 MED ORDER — ESCITALOPRAM OXALATE 20 MG PO TABS: ORAL_TABLET | ORAL | 0 refills | Status: DC

## 2016-12-09 ENCOUNTER — Other Ambulatory Visit: Payer: Self-pay | Admitting: Family Medicine

## 2016-12-09 NOTE — Telephone Encounter (Signed)
Last refill 10/07/16 #90, last OV 09/10/16

## 2017-01-11 ENCOUNTER — Ambulatory Visit (INDEPENDENT_AMBULATORY_CARE_PROVIDER_SITE_OTHER): Payer: Self-pay | Admitting: Family Medicine

## 2017-01-11 ENCOUNTER — Ambulatory Visit (INDEPENDENT_AMBULATORY_CARE_PROVIDER_SITE_OTHER)
Admission: RE | Admit: 2017-01-11 | Discharge: 2017-01-11 | Disposition: A | Discharge status: Home / Self Care | Payer: Self-pay | Source: Ambulatory Visit | Attending: Family Medicine | Admitting: Family Medicine

## 2017-01-11 ENCOUNTER — Encounter: Payer: Self-pay | Admitting: Family Medicine

## 2017-01-11 VITALS — BP 108/70 | HR 94 | Temp 98.3°F | Wt 174.8 lb

## 2017-01-11 DIAGNOSIS — K5900 Constipation, unspecified: Secondary | ICD-10-CM

## 2017-01-11 DIAGNOSIS — Z1211 Encounter for screening for malignant neoplasm of colon: Secondary | ICD-10-CM

## 2017-01-11 NOTE — Assessment & Plan Note (Signed)
Deteriorated- ? If she had some loose leakage around a stool ball. Will get KUB to rule out obstruction. Advised daily miralax, fleets enema. Refer to GI for screening colonoscopy and for further evaluation and tx of her constipation. The patient indicates understanding of these issues and agrees with the plan. Orders Placed This Encounter  Procedures  . DG Abd 1 View  . Ambulatory referral to Gastroenterology

## 2017-01-11 NOTE — Progress Notes (Signed)
Subjective:   Patient ID: Judy Huang, female    DOB: 13-Aug-1965, 51 y.o.   MRN: [id number]  Judy Huang is a pleasant 51 y.o. year old female who presents to clinic today with GI Problem and Constipation  on 01/11/2017  HPI:  Has had issues with intermittent constipation.  She takes a probiotic daily and typically this is effective in controlling her issues with constipation.  She has not had a BM in 3 days.  Has taken two doses of miralax and suppositories (she is not sure which ones). Tried to take pepto bismol but vomited after taking.  No fevers or chills.  Feels bloated but not abdominal pain.  This morning had some loose stools but "just a little."  Has never had a colonoscopy.    Current Outpatient Prescriptions on File Prior to Visit  Medication Sig Dispense Refill  . escitalopram (LEXAPRO) 20 MG tablet TAKE 1 TABLET BY MOUTH EVERY DAY 90 tablet 0  . Multiple Vitamin (MULTIVITAMIN) capsule Take 1 capsule by mouth daily.     No current facility-administered medications on file prior to visit.     No Known Allergies  Past Medical History:  Diagnosis Date  . History of gastroesophageal reflux (GERD)   . History of headache   . History of migraine   . History of UTI     Past Surgical History:  Procedure Laterality Date  . BREAST REDUCTION SURGERY  2000  . PARTIAL HYSTERECTOMY  1997  . REDUCTION MAMMAPLASTY Bilateral 2000   BILAT    Family History  Problem Relation Age of Onset  . Arthritis Mother   . Arthritis Father   . Hyperlipidemia Father   . Stroke Father   . High blood pressure Father   . Diabetes Father   . Arthritis Maternal Grandfather   . Lung cancer Maternal Grandfather   . Arthritis Maternal Grandmother   . High blood pressure Maternal Grandmother   . Cancer Maternal Grandmother   . Cancer Maternal Uncle   . Breast cancer Neg Hx     Social History   Social History  . Marital status: Single    Spouse name: N/A  . Number  of children: N/A  . Years of education: N/A   Occupational History  . Not on file.   Social History Main Topics  . Smoking status: Former Smoker    Start date: 07/26/2013  . Smokeless tobacco: Never Used  . Alcohol use No     Comment: rare  . Drug use: No  . Sexual activity: Not on file   Other Topics Concern  . Not on file   Social History Narrative  . No narrative on file   The PMH, PSH, Social History, Family History, Medications, and allergies have been reviewed in [facility], and have been updated if relevant.  Review of Systems  Constitutional: Negative.   Gastrointestinal: Positive for abdominal distention, constipation and vomiting. Negative for abdominal pain, anal bleeding, blood in stool, diarrhea, nausea and rectal pain.  All other systems reviewed and are negative.      Objective:    BP 108/70 (BP Location: Right Arm, Patient Position: Sitting, Cuff Size: Normal)   Pulse 94   Temp 98.3 F (36.8 C) (Oral)   Wt 174 lb 12 oz (79.3 kg)   SpO2 95%   BMI 29.30 kg/m    Physical Exam  Constitutional: She is oriented to person, place, and time. She appears well-developed and well-nourished. No distress.  HENT:  Head: Normocephalic and atraumatic.  Eyes: Conjunctivae are normal.  Cardiovascular: Normal rate.   Pulmonary/Chest: Effort normal.  Abdominal: Soft. Bowel sounds are normal. She exhibits no distension and no mass. There is tenderness. There is no rebound and no guarding.  Musculoskeletal: Normal range of motion.  Neurological: She is alert and oriented to person, place, and time. No cranial nerve deficit.  Skin: Skin is warm and dry. She is not diaphoretic.  Psychiatric: She has a normal mood and affect. Her behavior is normal. Judgment and thought content normal.  Nursing note and vitals reviewed.         Assessment & Plan:   Constipation, unspecified constipation type - Plan: DG Abd 1 View  Screening for colon cancer - Plan: Ambulatory referral to  Gastroenterology No Follow-up on file.

## 2017-01-11 NOTE — Patient Instructions (Signed)
Continue miralax. Add colace.   Pick up a fleets enema from the pharmacy.

## 2017-01-15 ENCOUNTER — Other Ambulatory Visit: Payer: Self-pay

## 2017-01-15 ENCOUNTER — Telehealth: Payer: Self-pay

## 2017-01-15 DIAGNOSIS — Z8371 Family history of colonic polyps: Secondary | ICD-10-CM

## 2017-01-15 DIAGNOSIS — Z800 Family history of malignant neoplasm of digestive organs: Secondary | ICD-10-CM

## 2017-01-15 DIAGNOSIS — K5900 Constipation, unspecified: Secondary | ICD-10-CM

## 2017-01-15 DIAGNOSIS — Z85038 Personal history of other malignant neoplasm of large intestine: Secondary | ICD-10-CM

## 2017-01-15 DIAGNOSIS — R109 Unspecified abdominal pain: Secondary | ICD-10-CM

## 2017-01-15 MED ORDER — NA SULFATE-K SULFATE-MG SULF 17.5-3.13-1.6 GM/177ML PO SOLN: 1.0000 | Freq: Once | ORAL | 0 refills | Status: AC

## 2017-01-15 NOTE — Telephone Encounter (Signed)
Gastroenterology Pre-Procedure Review  Request Date: 01/22/17 Requesting Physician: Dr. Allen Norris  PATIENT REVIEW QUESTIONS: The patient responded to the following health history questions as indicated:    1. Are you having any GI issues? yes (severe stomach pain, and constipation) 2. Do you have a personal history of Polyps? yes (yes) 3. Do you have a family history of Colon Cancer or Polyps? yes (mom dad and sister have had colon polyps, uncle had colon cancer) 4. Diabetes Mellitus? no 5. Joint replacements in the past 12 months?no 6. Major health problems in the past 3 months?no 7. Any artificial heart valves, MVP, or defibrillator?no    MEDICATIONS & ALLERGIES:    Patient reports the following regarding taking any anticoagulation/antiplatelet therapy:   Plavix, Coumadin, Eliquis, Xarelto, Lovenox, Pradaxa, Brilinta, or Effient? no Aspirin? no  Patient confirms/reports the following medications:  Current Outpatient Prescriptions  Medication Sig Dispense Refill  . escitalopram (LEXAPRO) 20 MG tablet TAKE 1 TABLET BY MOUTH EVERY DAY 90 tablet 0  . Multiple Vitamin (MULTIVITAMIN) capsule Take 1 capsule by mouth daily.     No current facility-administered medications for this visit.     Patient confirms/reports the following allergies:  No Known Allergies  No orders of the defined types were placed in this encounter.   AUTHORIZATION INFORMATION Primary Insurance: 1D#: Group #:  Secondary Insurance: 1D#: Group #:  SCHEDULE INFORMATION: Date: 01/22/17 Time: Location: [place]

## 2017-01-15 NOTE — Discharge Instructions (Signed)
General Anesthesia, Adult, Care After °These instructions provide you with information about caring for yourself after your procedure. Your health care provider may also give you more specific instructions. Your treatment has been planned according to current medical practices, but problems sometimes occur. Call your health care provider if you have any problems or questions after your procedure. °What can I expect after the procedure? °After the procedure, it is common to have: °· Vomiting. °· A sore throat. °· Mental slowness. ° °It is common to feel: °· Nauseous. °· Cold or shivery. °· Sleepy. °· Tired. °· Sore or achy, even in parts of your body where you did not have surgery. ° °Follow these instructions at home: °For at least 24 hours after the procedure: °· Do not: °? Participate in activities where you could fall or become injured. °? Drive. °? Use heavy machinery. °? Drink alcohol. °? Take sleeping pills or medicines that cause drowsiness. °? Make important decisions or sign legal documents. °? Take care of children on your own. °· Rest. °Eating and drinking °· If you vomit, drink water, juice, or soup when you can drink without vomiting. °· Drink enough fluid to keep your urine clear or pale yellow. °· Make sure you have little or no nausea before eating solid foods. °· Follow the diet recommended by your health care provider. °General instructions °· Have a responsible adult stay with you until you are awake and alert. °· Return to your normal activities as told by your health care provider. Ask your health care provider what activities are safe for you. °· Take over-the-counter and prescription medicines only as told by your health care provider. °· If you smoke, do not smoke without supervision. °· Keep all follow-up visits as told by your health care provider. This is important. °Contact a health care provider if: °· You continue to have nausea or vomiting at home, and medicines are not helpful. °· You  cannot drink fluids or start eating again. °· You cannot urinate after 8-12 hours. °· You develop a skin rash. °· You have fever. °· You have increasing redness at the site of your procedure. °Get help right away if: °· You have difficulty breathing. °· You have chest pain. °· You have unexpected bleeding. °· You feel that you are having a life-threatening or urgent problem. °This information is not intended to replace advice given to you by your health care provider. Make sure you discuss any questions you have with your health care provider. °Document Released: 10/12/2000 Document Revised: 12/09/2015 Document Reviewed: 06/20/2015 °Elsevier Interactive Patient Education © 2018 [employer] ° °

## 2017-01-18 ENCOUNTER — Encounter: Payer: Self-pay | Admitting: *Deleted

## 2017-01-22 ENCOUNTER — Ambulatory Visit
Admission: RE | Admit: 2017-01-22 | Discharge: 2017-01-22 | Disposition: A | Discharge status: Home / Self Care | Payer: Self-pay | Source: Ambulatory Visit | Attending: Gastroenterology | Admitting: Gastroenterology

## 2017-01-22 ENCOUNTER — Ambulatory Visit: Payer: Self-pay | Admitting: Anesthesiology

## 2017-01-22 ENCOUNTER — Encounter
Admission: RE | Disposition: A | Discharge status: Home / Self Care | Payer: Self-pay | Source: Ambulatory Visit | Attending: Gastroenterology

## 2017-01-22 DIAGNOSIS — K641 Second degree hemorrhoids: Secondary | ICD-10-CM | POA: Insufficient documentation

## 2017-01-22 DIAGNOSIS — K5900 Constipation, unspecified: Secondary | ICD-10-CM | POA: Insufficient documentation

## 2017-01-22 DIAGNOSIS — Z87891 Personal history of nicotine dependence: Secondary | ICD-10-CM | POA: Insufficient documentation

## 2017-01-22 DIAGNOSIS — D125 Benign neoplasm of sigmoid colon: Secondary | ICD-10-CM | POA: Insufficient documentation

## 2017-01-22 DIAGNOSIS — K219 Gastro-esophageal reflux disease without esophagitis: Secondary | ICD-10-CM | POA: Insufficient documentation

## 2017-01-22 DIAGNOSIS — K635 Polyp of colon: Secondary | ICD-10-CM

## 2017-01-22 DIAGNOSIS — Z79899 Other long term (current) drug therapy: Secondary | ICD-10-CM | POA: Insufficient documentation

## 2017-01-22 HISTORY — PX: POLYPECTOMY: SHX5525

## 2017-01-22 HISTORY — DX: Nausea with vomiting, unspecified: R11.2

## 2017-01-22 HISTORY — DX: Other specified postprocedural states: Z98.890

## 2017-01-22 HISTORY — PX: COLONOSCOPY WITH PROPOFOL: SHX5780

## 2017-01-22 HISTORY — DX: Gastro-esophageal reflux disease without esophagitis: K21.9

## 2017-01-22 SURGERY — COLONOSCOPY WITH PROPOFOL
Anesthesia: General | Wound class: Contaminated

## 2017-01-22 MED ORDER — ACETAMINOPHEN 325 MG PO TABS: 325.0000 mg | ORAL_TABLET | ORAL | Status: DC | PRN

## 2017-01-22 MED ORDER — LACTATED RINGERS IV SOLN
INTRAVENOUS | Status: DC | PRN
  Administered 2017-01-22: 11:00:00 via INTRAVENOUS

## 2017-01-22 MED ORDER — STERILE WATER FOR IRRIGATION IR SOLN
Status: DC | PRN
  Administered 2017-01-22: 11:00:00

## 2017-01-22 MED ORDER — PROPOFOL 10 MG/ML IV BOLUS
INTRAVENOUS | Status: DC | PRN
  Administered 2017-01-22: 70 mg via INTRAVENOUS
  Administered 2017-01-22 (×2): 20 mg via INTRAVENOUS
  Administered 2017-01-22: 30 mg via INTRAVENOUS
  Administered 2017-01-22 (×3): 20 mg via INTRAVENOUS

## 2017-01-22 MED ORDER — ACETAMINOPHEN 160 MG/5ML PO SOLN: 325.0000 mg | ORAL | Status: DC | PRN

## 2017-01-22 MED ORDER — LIDOCAINE HCL (CARDIAC) 20 MG/ML IV SOLN
INTRAVENOUS | Status: DC | PRN
  Administered 2017-01-22: 40 mg via INTRAVENOUS

## 2017-01-22 SURGICAL SUPPLY — 23 items

## 2017-01-22 NOTE — Anesthesia Preprocedure Evaluation (Signed)
Anesthesia Evaluation  Patient identified by MRN, date of birth, ID band Patient awake    Reviewed: Allergy & Precautions, H&P , NPO status , Patient's Chart, lab work & pertinent test results  Airway Mallampati: II  TM Distance: >3 FB Neck ROM: full    Dental no notable dental hx.    Pulmonary former smoker,    Pulmonary exam normal        Cardiovascular Normal cardiovascular exam     Neuro/Psych PSYCHIATRIC DISORDERS    GI/Hepatic GERD  ,  Endo/Other    Renal/GU      Musculoskeletal   Abdominal   Peds  Hematology   Anesthesia Other Findings   Reproductive/Obstetrics                             Anesthesia Physical Anesthesia Plan  ASA: II  Anesthesia Plan: General   Post-op Pain Management:    Induction:   PONV Risk Score and Plan: 3 and Propofol  Airway Management Planned:   Additional Equipment:   Intra-op Plan:   Post-operative Plan:   Informed Consent: I have reviewed the patients History and Physical, chart, labs and discussed the procedure including the risks, benefits and alternatives for the proposed anesthesia with the patient or authorized representative who has indicated his/her understanding and acceptance.     Plan Discussed with:   Anesthesia Plan Comments:         Anesthesia Quick Evaluation

## 2017-01-22 NOTE — Transfer of Care (Signed)
Immediate Anesthesia Transfer of Care Note  Patient: Judy Huang  Procedure(s) Performed: Procedure(s): COLONOSCOPY WITH PROPOFOL (N/A) POLYPECTOMY  Patient Location: PACU  Anesthesia Type: General  Level of Consciousness: awake, alert  and patient cooperative  Airway and Oxygen Therapy: Patient Spontanous Breathing and Patient connected to supplemental oxygen  Post-op Assessment: Post-op Vital signs reviewed, Patient's Cardiovascular Status Stable, Respiratory Function Stable, Patent Airway and No signs of Nausea or vomiting  Post-op Vital Signs: Reviewed and stable  Complications: No apparent anesthesia complications

## 2017-01-22 NOTE — H&P (Signed)
   Judy Lame, MD [address] [place], [place] [address] Phone:[phone] Fax : [phone]  Primary Care Physician:  Lucille Passy, MD Primary Gastroenterologist:  Dr. Allen Norris  Pre-Procedure History & Physical: HPI:  Judy Huang is a 51 y.o. female is here for an colonoscopy.   Past Medical History:  Diagnosis Date  . GERD (gastroesophageal reflux disease)   . History of gastroesophageal reflux (GERD)   . History of headache   . History of migraine   . History of UTI   . PONV (postoperative nausea and vomiting)     Past Surgical History:  Procedure Laterality Date  . BREAST REDUCTION SURGERY  2000  . PARTIAL HYSTERECTOMY  1997  . REDUCTION MAMMAPLASTY Bilateral 2000   BILAT    Prior to Admission medications   Medication Sig Start Date End Date Taking? Authorizing Provider  escitalopram (LEXAPRO) 20 MG tablet TAKE 1 TABLET BY MOUTH EVERY DAY 12/09/16  Yes Lucille Passy, MD  Multiple Vitamin (MULTIVITAMIN) capsule Take 1 capsule by mouth daily.   Yes [provider]  Probiotic Product (PROBIOTIC DAILY PO) Take by mouth.   Yes [provider]    Allergies as of 01/15/2017  . (No Known Allergies)    Family History  Problem Relation Age of Onset  . Arthritis Mother   . Arthritis Father   . Hyperlipidemia Father   . Stroke Father   . High blood pressure Father   . Diabetes Father   . Arthritis Maternal Grandfather   . Lung cancer Maternal Grandfather   . Arthritis Maternal Grandmother   . High blood pressure Maternal Grandmother   . Cancer Maternal Grandmother   . Cancer Maternal Uncle   . Breast cancer Neg Hx     Social History   Social History  . Marital status: Single    Spouse name: N/A  . Number of children: N/A  . Years of education: N/A   Occupational History  . Not on file.   Social History Main Topics  . Smoking status: Former Smoker    Start date: 07/26/2013    Quit date: 2016  . Smokeless tobacco:  Never Used  . Alcohol use No     Comment: rare  . Drug use: No  . Sexual activity: Not on file   Other Topics Concern  . Not on file   Social History Narrative  . No narrative on file    Review of Systems: See HPI, otherwise negative ROS  Physical Exam: BP 121/77   Pulse 66   Temp 98.1 F (36.7 C) (Temporal)   Ht 5' 4.75" (1.645 m)   Wt 171 lb (77.6 kg)   SpO2 95%   BMI 28.68 kg/m  General:   Alert,  pleasant and cooperative in NAD Head:  Normocephalic and atraumatic. Neck:  Supple; no masses or thyromegaly. Lungs:  Clear throughout to auscultation.    Heart:  Regular rate and rhythm. Abdomen:  Soft, nontender and nondistended. Normal bowel sounds, without guarding, and without rebound.   Neurologic:  Alert and  oriented x4;  grossly normal neurologically.  Impression/Plan: [name] is here for an colonoscopy to be performed for constipation and bloating  Risks, benefits, limitations, and alternatives regarding  colonoscopy have been reviewed with the patient.  Questions have been answered.  All parties agreeable.   Judy Lame, MD  01/22/2017, 9:58 AM

## 2017-01-22 NOTE — Anesthesia Postprocedure Evaluation (Signed)
Anesthesia Post Note  Patient: Judy Huang  Procedure(s) Performed: Procedure(s) (LRB): COLONOSCOPY WITH PROPOFOL (N/A) POLYPECTOMY  Patient location during evaluation: PACU Anesthesia Type: General Level of consciousness: awake and alert and oriented Pain management: satisfactory to patient Vital Signs Assessment: post-procedure vital signs reviewed and stable Respiratory status: spontaneous breathing, nonlabored ventilation and respiratory function stable Cardiovascular status: blood pressure returned to baseline and stable Postop Assessment: Adequate PO intake and No signs of nausea or vomiting Anesthetic complications: no    Raliegh Ip

## 2017-01-22 NOTE — Op Note (Signed)
[facility] Patient Name: Judy Huang Procedure Date: 01/22/2017 10:58 AM MRN: [id number] Account #: [id number] Date of Birth: May 11, 1966 Admit Type: Outpatient Age: 51 Room: [facility] OR ROOM 01 Gender: Female Note Status: Finalized Procedure:            Colonoscopy Indications:          Constipation Providers:            [name] MD, MD Referring MD:         Marciano Sequin. Deborra Medina (Referring MD) Medicines:            Propofol per Anesthesia Complications:        No immediate complications. Procedure:            Pre-Anesthesia Assessment:                       - Prior to the procedure, a History and Physical was                        performed, and patient medications and allergies were                        reviewed. The patient's tolerance of previous                        anesthesia was also reviewed. The risks and benefits of                        the procedure and the sedation options and risks were                        discussed with the patient. All questions were                        answered, and informed consent was obtained. Prior                        Anticoagulants: The patient has taken no previous                        anticoagulant or antiplatelet agents. ASA Grade                        Assessment: II - A patient with mild systemic disease.                        After reviewing the risks and benefits, the patient was                        deemed in satisfactory condition to undergo the                        procedure.                       After obtaining informed consent, the colonoscope was                        passed under direct vision. Throughout the procedure,  the patient's blood pressure, pulse, and oxygen                        saturations were monitored continuously. The [place] [id number]) was introduced through the                        anus and  advanced to the the cecum, identified by                        appendiceal orifice and ileocecal valve. The                        colonoscopy was performed without difficulty. The                        patient tolerated the procedure well. The quality of                        the bowel preparation was excellent. Findings:      The perianal and digital rectal examinations were normal.      A few sessile polyps were found in the sigmoid colon. The polyps were 3       to 4 mm in size. These polyps were removed with a cold biopsy forceps.       Resection and retrieval were complete.      Non-bleeding internal hemorrhoids were found during retroflexion. The       hemorrhoids were Grade II (internal hemorrhoids that prolapse but reduce       spontaneously). Impression:           - A few 3 to 4 mm polyps in the sigmoid colon, removed                        with a cold biopsy forceps. Resected and retrieved.                       - Non-bleeding internal hemorrhoids. Recommendation:       - Discharge patient to home.                       - Resume previous diet.                       - Continue present medications.                       - Await pathology results.                       - Repeat colonoscopy in 5 years if polyp adenoma and 10                        years if hyperplastic Procedure Code(s):    --- Professional ---                       [id number], Colonoscopy, flexible; with biopsy, single or  multiple Diagnosis Code(s):    --- Professional ---                       K59.00, Constipation, unspecified                       D12.5, Benign neoplasm of sigmoid colon CPT copyright 2016 American Medical Association. All rights reserved. The codes documented in this report are preliminary and upon coder review may  be revised to meet current compliance requirements. [name] MD, MD 01/22/2017 11:21:33 AM This report has been signed electronically. Number of Addenda:  0 Note Initiated On: 01/22/2017 10:58 AM Scope Withdrawal Time: 0 hours 6 minutes 26 seconds  Total Procedure Duration: 0 hours 10 minutes 1 second       [facility]

## 2017-01-22 NOTE — Anesthesia Procedure Notes (Signed)
Performed by: Konstantinos Cordoba Pre-anesthesia Checklist: Patient identified, Emergency Drugs available, Suction available, Timeout performed and Patient being monitored Patient Re-evaluated:Patient Re-evaluated prior to induction Oxygen Delivery Method: Nasal cannula Placement Confirmation: positive ETCO2       

## 2017-01-25 ENCOUNTER — Encounter: Payer: Self-pay | Admitting: Gastroenterology

## 2017-01-26 ENCOUNTER — Encounter: Payer: Self-pay | Admitting: Gastroenterology

## 2017-04-10 ENCOUNTER — Other Ambulatory Visit: Payer: Self-pay | Admitting: Family Medicine

## 2017-07-23 ENCOUNTER — Other Ambulatory Visit: Payer: Self-pay

## 2017-07-23 MED ORDER — ESCITALOPRAM OXALATE 20 MG PO TABS: 20.0000 mg | ORAL_TABLET | Freq: Every day | ORAL | 0 refills | Status: DC

## 2017-08-02 ENCOUNTER — Telehealth: Payer: Self-pay | Admitting: Family Medicine

## 2017-08-02 ENCOUNTER — Telehealth: Payer: Self-pay

## 2017-08-02 MED ORDER — ESCITALOPRAM OXALATE 20 MG PO TABS: 20.0000 mg | ORAL_TABLET | Freq: Every day | ORAL | 0 refills | Status: DC

## 2017-08-02 NOTE — Telephone Encounter (Signed)
Copied from [place] [phone]. Topic: Quick Communication - Office Called Patient >> Aug 02, 2017  2:58 PM Yvette Rack wrote: Reason for TDS:[id number] calling to speak with Sharyn Lull in clinical states that she had just called her 5 min ago

## 2017-08-02 NOTE — Telephone Encounter (Signed)
Pt is feeling better today after resuming Tx/I have her scheduled for 2.18.19 with TA/thx dmf

## 2017-08-02 NOTE — Telephone Encounter (Signed)
I called pt and LMOVM stating that I was uncertain of what medication was discussed as there is no mention from person that answered phone stating what med it is/also stated that the Rx for Escitalopram was sent in on 1.4.19 #90 with a note for pt to sched appt/I tried to call pharm but was put on hold for 15 minutes/I am resending the Rx as she should not have to be without it/thx dmf

## 2017-08-02 NOTE — Telephone Encounter (Signed)
PLEASE NOTE: All timestamps contained within this report are represented as [place] Standard Time. CONFIDENTIALTY NOTICE: This fax transmission is intended only for the addressee. It contains information that is legally privileged, confidential or otherwise protected from use or disclosure. If you are not the intended recipient, you are strictly prohibited from reviewing, disclosing, copying using or disseminating any of this information or taking any action in reliance on or regarding this information. If you have received this fax in error, please notify us immediately by telephone so that we can arrange for its return to [name]. Phone: [phone], Toll-Free: [phone], Fax: [phone] Page: 1 of 2 Call Id: [id number] [place] Patient Name: Judy Huang Gender: Female DOB: [birth year] Age: 52 Y 51 M 13 D Return Phone Number: [phone] (Primary) Address: City/State/Zip: [place] [address] Client [facility] Night - Client Client Site [facility] Physician Arnette Norris- MD Contact Type Call Who Is Calling Patient / Member / Family / Caregiver Call Type Triage / Clinical Relationship To Patient Self Return Phone Number [phone] (Primary) Chief Complaint Hot flashes Reason for Call Medication Question / Request Initial Comment Caller states has run out of her Rx, the pharm says it was not approved when they submitted for a refill. The Rx is for severe hot flashes and anxiety, menopause Sx. Translation No Nurse Assessment Nurse: Harlow Mares, RN, Suanne Marker Date/Time (Eastern Time): 08/01/2017 10:48:57 AM Confirm and document reason for call. If symptomatic, describe symptoms. ---Caller states has run out of her Rx, the pharm says it was not approved when they submitted for a refill. The Rx is for severe hot flashes and anxiety, menopause  Sx. She is seeking Escitalopralam. Reports that she has been out for 5 days now. She has felt terrible since not taking this medication. Does the patient have any new or worsening symptoms? ---Yes Will a triage be completed? ---Yes Related visit to physician within the last 2 weeks? ---No Does the PT have any chronic conditions? (i.e. diabetes, asthma, etc.) ---Yes List chronic conditions. ---anxiety/depression Is the patient pregnant or possibly pregnant? (Ask all females between the ages of 31-55) ---No Is this a behavioral health or substance abuse call? ---No Guidelines Guideline Title Affirmed Question Affirmed Notes Nurse Date/Time Eilene Ghazi Time) Cough - Acute Non- Productive Wheezing is present Harlow Mares, Therapist, sports, Suanne Marker 08/01/2017 10:53:02 AM Disp. Time Eilene Ghazi Time) Disposition Final User 08/01/2017 10:56:47 AM See Physician within 4 Hours (or PCP triage) Yes Harlow Mares, RN, Suanne Marker PLEASE NOTE: All timestamps contained within this report are represented as [place] Standard Time. CONFIDENTIALTY NOTICE: This fax transmission is intended only for the addressee. It contains information that is legally privileged, confidential or otherwise protected from use or disclosure. If you are not the intended recipient, you are strictly prohibited from reviewing, disclosing, copying using or disseminating any of this information or taking any action in reliance on or regarding this information. If you have received this fax in error, please notify us immediately by telephone so that we can arrange for its return to [name]. Phone: [phone], Toll-Free: [phone], Fax: [phone] Page: 2 of 2 Call Id: [id number] [place] Disagree/Comply Comply Caller Understands Yes PreDisposition Did not know what to do Care Advice Given Per Guideline SEE PHYSICIAN WITHIN 4 HOURS (or PCP triage): * IF OFFICE WILL BE CLOSED AND NO PCP TRIAGE: You need to be seen within the next 3 or 4 hours. A nearby  Urgent Care  Center is often a good source of care. Another choice is to go to the ER. Go sooner if you become worse. CALL BACK IF: * You become worse. CARE ADVICE given per Cough - Acute Non-Productive (Adult) guideline. Referrals [facility] [place]

## 2017-08-02 NOTE — Telephone Encounter (Signed)
Pt states that she P/U the Rx from pharmacy/she had been without it for a week and was having flu-like Sx which was withdrawals/thx dmf

## 2017-08-18 ENCOUNTER — Other Ambulatory Visit: Payer: Self-pay | Admitting: Family Medicine

## 2017-09-06 ENCOUNTER — Encounter: Payer: Self-pay | Admitting: Family Medicine

## 2017-09-06 ENCOUNTER — Ambulatory Visit: Payer: Self-pay | Admitting: Family Medicine

## 2017-09-06 ENCOUNTER — Telehealth: Payer: Self-pay

## 2017-09-06 VITALS — BP 140/86 | HR 79 | Temp 98.6°F | Ht 64.75 in | Wt 183.8 lb

## 2017-09-06 DIAGNOSIS — Z0000 Encounter for general adult medical examination without abnormal findings: Secondary | ICD-10-CM | POA: Insufficient documentation

## 2017-09-06 DIAGNOSIS — E785 Hyperlipidemia, unspecified: Secondary | ICD-10-CM

## 2017-09-06 DIAGNOSIS — F329 Major depressive disorder, single episode, unspecified: Secondary | ICD-10-CM

## 2017-09-06 DIAGNOSIS — N951 Menopausal and female climacteric states: Secondary | ICD-10-CM

## 2017-09-06 DIAGNOSIS — F32A Depression, unspecified: Secondary | ICD-10-CM

## 2017-09-06 DIAGNOSIS — Z23 Encounter for immunization: Secondary | ICD-10-CM

## 2017-09-06 DIAGNOSIS — R739 Hyperglycemia, unspecified: Secondary | ICD-10-CM

## 2017-09-06 LAB — COMPREHENSIVE METABOLIC PANEL
ALK PHOS: 86 U/L (ref 39–117)
ALT: 30 U/L (ref 0–35)
AST: 18 U/L (ref 0–37)
Albumin: 4.5 g/dL (ref 3.5–5.2)
BILIRUBIN TOTAL: 0.4 mg/dL (ref 0.2–1.2)
BUN: 10 mg/dL (ref 6–23)
CO2: 30 meq/L (ref 19–32)
CREATININE: 0.79 mg/dL (ref 0.40–1.20)
Calcium: 10 mg/dL (ref 8.4–10.5)
Chloride: 101 mEq/L (ref 96–112)
GFR: 81.05 mL/min (ref >60.00)
Glucose, Bld: 86 mg/dL (ref 70–99)
Potassium: 4.9 mEq/L (ref 3.5–5.1)
SODIUM: 138 meq/L (ref 135–145)
TOTAL PROTEIN: 7.5 g/dL (ref 6.0–8.3)

## 2017-09-06 LAB — CBC WITH DIFFERENTIAL/PLATELET
BASOS ABS: 0.1 10*3/uL (ref 0.0–0.1)
Basophils Relative: 0.7 % (ref 0.0–3.0)
EOS ABS: 0.3 10*3/uL (ref 0.0–0.7)
Eosinophils Relative: 3.6 % (ref 0.0–5.0)
HCT: 41.1 % (ref 36.0–46.0)
Hemoglobin: 13.9 g/dL (ref 12.0–15.0)
LYMPHS ABS: 2.6 10*3/uL (ref 0.7–4.0)
Lymphocytes Relative: 33.3 % (ref 12.0–46.0)
MCHC: 33.9 g/dL (ref 30.0–36.0)
MCV: 91.4 fl (ref 78.0–100.0)
MONO ABS: 0.3 10*3/uL (ref 0.1–1.0)
MONOS PCT: 4.2 % (ref 3.0–12.0)
Neutro Abs: 4.6 10*3/uL (ref 1.4–7.7)
Neutrophils Relative %: 58.2 % (ref 43.0–77.0)
Platelets: 402 10*3/uL — ABNORMAL HIGH (ref 150.0–400.0)
RBC: 4.5 Mil/uL (ref 3.87–5.11)
RDW: 12.8 % (ref 11.5–15.5)
WBC: 7.9 10*3/uL (ref 4.0–10.5)

## 2017-09-06 LAB — LIPID PANEL
Cholesterol: 203 mg/dL — ABNORMAL HIGH (ref 0–200)
HDL: 37 mg/dL — ABNORMAL LOW (ref >39.00)
NONHDL: 166.19
Total CHOL/HDL Ratio: 5
Triglycerides: 217 mg/dL — ABNORMAL HIGH (ref 0.0–149.0)
VLDL: 43.4 mg/dL — ABNORMAL HIGH (ref 0.0–40.0)

## 2017-09-06 LAB — TSH: TSH: 3.19 u[IU]/mL (ref 0.35–4.50)

## 2017-09-06 LAB — LDL CHOLESTEROL, DIRECT: LDL DIRECT: 135 mg/dL

## 2017-09-06 LAB — HEMOGLOBIN A1C: HEMOGLOBIN A1C: 6.1 % (ref 4.6–6.5)

## 2017-09-06 MED ORDER — RANITIDINE HCL 150 MG PO TABS: 150.0000 mg | ORAL_TABLET | Freq: Two times a day (BID) | ORAL | 3 refills | Status: DC

## 2017-09-06 NOTE — Assessment & Plan Note (Signed)
Due for labs today. 

## 2017-09-06 NOTE — Telephone Encounter (Signed)
TA-Discussed Ranitidine with pt/she is in agreement to whatever you choose/plz see below/if you want her to do BID dosing of 150mg  or 1qd of 300mg //plz advise and I can send to [facility] in [place]/thx dmf  Ranitidine 150mg  "Tab" #180 for 90d is $10.00 Ranitidin300mg  "Tab" #90 for 90d is $10.00

## 2017-09-06 NOTE — Patient Instructions (Signed)
Great to see you. I will call you with your lab results from today and you can view them online.   

## 2017-09-06 NOTE — Telephone Encounter (Signed)
Noted.  eRx sent.

## 2017-09-06 NOTE — Progress Notes (Signed)
Subjective:   Patient ID: Judy Huang, female    DOB: Apr 10, 1966, 52 y.o.   MRN: [id number]  Judy Huang is a pleasant 52 y.o. year old female who presents to clinic today with Annual Exam (Patient is here today for a CPE without PAP.  She is due for a Mammogram and will cal [facility] to get that scheduled.  She will get her Tdap today but declines Influenza.  She had coffee with cream and sugar today.) and Diabetes  on 09/06/2017  HPI:  Remote h/o hysterectomy Colonoscopy 01/22/17 Mammogram 05/15/15  Health Maintenance  Topic Date Due  . HIV Screening  02/17/1980  . MAMMOGRAM  05/14/2017  . INFLUENZA VACCINE  04/29/2019 (Originally 02/17/2017)  . PAP SMEAR  09/11/2019  . COLONOSCOPY  01/23/2027  . TETANUS/TDAP  09/07/2027   Depression- has been stable on Lexapro 20 mg daily for years. Denies symptoms of anxiety/depression.   Current Outpatient Medications on File Prior to Visit  Medication Sig Dispense Refill  . alum & mag hydroxide-simeth (MAALOX/MYLANTA) 200-200-20 MG/5ML suspension Take by mouth every 6 (six) hours as needed for indigestion or heartburn.    . Apple Cider Vinegar 300 MG TABS Take by mouth.    . escitalopram (LEXAPRO) 20 MG tablet Take 1 tablet (20 mg total) by mouth daily. 90 tablet 0  . Multiple Vitamin (MULTIVITAMIN) capsule Take 1 capsule by mouth daily.    . Probiotic Product (PROBIOTIC DAILY PO) Take by mouth.     No current facility-administered medications on file prior to visit.     Allergies  Allergen Reactions  . Other     Dissolvable sutures cause "reaction"  . Percocet [Oxycodone-Acetaminophen] Nausea Only    Past Medical History:  Diagnosis Date  . GERD (gastroesophageal reflux disease)   . History of gastroesophageal reflux (GERD)   . History of headache   . History of migraine   . History of UTI   . PONV (postoperative nausea and vomiting)     Past Surgical History:  Procedure Laterality Date  . BREAST REDUCTION  SURGERY  2000  . COLONOSCOPY WITH PROPOFOL N/A 01/22/2017   Procedure: COLONOSCOPY WITH PROPOFOL;  Surgeon: [name], MD;  Location: [facility];  Service: Endoscopy;  Laterality: N/A;  . PARTIAL HYSTERECTOMY  1997  . POLYPECTOMY  01/22/2017   Procedure: POLYPECTOMY;  Surgeon: [name], MD;  Location: [facility];  Service: Endoscopy;;  . REDUCTION MAMMAPLASTY Bilateral 2000   BILAT    Family History  Problem Relation Age of Onset  . Arthritis Mother   . Arthritis Father   . Hyperlipidemia Father   . Stroke Father   . High blood pressure Father   . Diabetes Father   . Arthritis Maternal Grandfather   . Lung cancer Maternal Grandfather   . Arthritis Maternal Grandmother   . High blood pressure Maternal Grandmother   . Cancer Maternal Grandmother   . Cancer Maternal Uncle   . Breast cancer Neg Hx     Social History   Socioeconomic History  . Marital status: Single    Spouse name: Not on file  . Number of children: Not on file  . Years of education: Not on file  . Highest education level: Not on file  Social Needs  . Financial resource strain: Not on file  . Food insecurity - worry: Not on file  . Food insecurity - inability: Not on file  . Transportation needs - medical: Not on file  .  Transportation needs - non-medical: Not on file  Occupational History  . Not on file  Tobacco Use  . Smoking status: Former Smoker    Start date: 07/26/2013    Last attempt to quit: 2016    Years since quitting: 3.1  . Smokeless tobacco: Never Used  Substance and Sexual Activity  . Alcohol use: No    Comment: rare  . Drug use: No  . Sexual activity: Not on file  Other Topics Concern  . Not on file  Social History Narrative  . Not on file   The PMH, PSH, Social History, Family History, Medications, and allergies have been reviewed in [facility], and have been updated if relevant.   Review of Systems  Constitutional: Negative.   HENT: Negative.   Eyes: Negative.     Respiratory: Negative.   Cardiovascular: Negative.   Gastrointestinal: Negative.   Endocrine: Negative.   Genitourinary: Negative.   Musculoskeletal: Negative.   Skin: Negative.   Allergic/Immunologic: Negative.   Neurological: Negative.   Hematological: Negative.   Psychiatric/Behavioral: Negative.   All other systems reviewed and are negative.      Objective:    BP 140/86 (BP Location: Left Arm, Patient Position: Sitting, Cuff Size: Normal)   Pulse 79   Temp 98.6 F (37 C) (Oral)   Ht 5' 4.75" (1.645 m)   Wt 183 lb 12.8 oz (83.4 kg)   SpO2 96%   BMI 30.82 kg/m    Physical Exam   General:  Well-developed,well-nourished,in no acute distress; alert,appropriate and cooperative throughout examination Head:  normocephalic and atraumatic.   Eyes:  vision grossly intact, PERRL Ears:  R ear normal and L ear normal externally, TMs clear bilaterally Nose:  no external deformity.   Mouth:  good dentition.   Neck:  No deformities, masses, or tenderness noted. Breasts:  No mass, nodules, thickening, tenderness, bulging, retraction, inflamation, nipple discharge or skin changes noted.   Lungs:  Normal respiratory effort, chest expands symmetrically. Lungs are clear to auscultation, no crackles or wheezes. Heart:  Normal rate and regular rhythm. S1 and S2 normal without gallop, murmur, click, rub or other extra sounds. Abdomen:  Bowel sounds positive,abdomen soft and non-tender without masses, organomegaly or hernias noted. Msk:  No deformity or scoliosis noted of thoracic or lumbar spine.   Extremities:  No clubbing, cyanosis, edema, or deformity noted with normal full range of motion of all joints.   Neurologic:  alert & oriented X3 and gait normal.   Skin:  Intact without suspicious lesions or rashes Cervical Nodes:  No lymphadenopathy noted Axillary Nodes:  No palpable lymphadenopathy Psych:  Cognition and judgment appear intact. Alert and cooperative with normal attention  span and concentration. No apparent delusions, illusions, hallucinations       Assessment & Plan:   Well woman exam (no gynecological exam)  Depression, unspecified depression type  Need for Tdap vaccination - Plan: Tdap vaccine greater than or equal to 7yo IM  Menopausal symptoms  Hyperlipidemia, unspecified hyperlipidemia type - Plan: CBC with Differential/Platelet, Comprehensive metabolic panel, Lipid panel, TSH No Follow-up on file.

## 2017-09-06 NOTE — Assessment & Plan Note (Signed)
Reviewed preventive care protocols, scheduled due services, and updated immunizations Discussed nutrition, exercise, diet, and healthy lifestyle.  Orders Placed This Encounter  Procedures  . Tdap vaccine greater than or equal to 53yo IM  . CBC with Differential/Platelet  . Comprehensive metabolic panel  . Lipid panel  . TSH    

## 2017-09-06 NOTE — Assessment & Plan Note (Signed)
Well controlled on current dose of lexapro. No changes made today. 

## 2017-09-07 ENCOUNTER — Telehealth: Payer: Self-pay

## 2017-09-07 NOTE — Telephone Encounter (Signed)
TA-I spoke to pt/she seemed very anxious/[facility]/She does not add Na to her food and drinks a gallon of H2O qd/only Sx is headache/I advised for her to use deep breathing and sit for a couple minutes at the machine before taking her BP/also advised for her to practice this at home and possibly try meditation app and put her feet up but if has chest, neck, jaw, arm pain to go to ED/I told her I will call her in am to see what reading she got/Plz advise on any additional information/th dmf   Copied from [place] [id number]. Topic: Inquiry >> Sep 07, 2017  2:47 PM Pricilla Handler wrote: Reason for CRM: Patient called wanting to speak with Westley Gambles, Dr. Hulen Shouts nurse. Patient's BP today is 149/97. Patient stated that she would call Sharyn Lull today to let her know her blood pressure reading. Patient is very concerned, but wants to speak to [place] only. Please call patient once you have the opportunity.      Thank You!!!

## 2017-09-08 ENCOUNTER — Telehealth: Payer: Self-pay

## 2017-09-08 NOTE — Telephone Encounter (Signed)
TA-I followed-up with pt about her BP readings Last night she went to the fire dept: 158/97 This am: 126/88 P-88 5 min later: 116/92 2pm today: 131/94  I advised her that I would send this to you/she said she was kind of freaked out/she denies any Sx associated with the readings  I gave her information about Dandelion Root Tea and to take a tea bag and let it steep for 15 min then add honey and drink it when her BP is high and it will lower it a bit  Plz advise what you would like for me to tell her/thx dmf

## 2017-09-08 NOTE — Telephone Encounter (Signed)
Patient calling back to speak with Dr Elonda Husky assistant

## 2017-09-08 NOTE — Telephone Encounter (Signed)
Please call to check on patient. 

## 2017-09-08 NOTE — Telephone Encounter (Signed)
Blood pressure is looking much better today which is reassuring.  Please call to check on her again tomorrow (Thursday).  Thank you.

## 2017-09-09 MED ORDER — HYDROCHLOROTHIAZIDE 12.5 MG PO CAPS: 12.5000 mg | ORAL_CAPSULE | Freq: Every day | ORAL | 1 refills | Status: DC

## 2017-09-09 NOTE — Telephone Encounter (Signed)
Pt aware and agrees/Per TA if caps not covered ok to change HCTZ to tabs/thx dmf

## 2017-09-09 NOTE — Telephone Encounter (Signed)
TA-She checked her BP at 7:30am and it was 138/97/she acts like she is freaked out about it a bit/I told her I would call her later this afternoon/plz advise/thx dmf

## 2017-09-09 NOTE — Telephone Encounter (Signed)
Let's start her on a low dose diuretic (water pill), HCTZ 12.5 mg daily and have her follow up with me in two weeks. [facility]

## 2017-09-10 ENCOUNTER — Ambulatory Visit: Payer: Self-pay | Admitting: Family Medicine

## 2017-09-10 ENCOUNTER — Ambulatory Visit: Payer: Self-pay

## 2017-09-10 ENCOUNTER — Encounter: Payer: Self-pay | Admitting: Family Medicine

## 2017-09-10 VITALS — BP 142/86 | HR 76 | Temp 98.4°F | Ht 64.75 in | Wt 181.0 lb

## 2017-09-10 DIAGNOSIS — I10 Essential (primary) hypertension: Secondary | ICD-10-CM

## 2017-09-10 NOTE — Patient Instructions (Signed)
Please try to take her blood pressure at the same time during the day. I would try to compare this over the course of a few weeks. Please try to exercise at least 30 minutes for about 5 days out of the week. Please follow-up if you notice her blood pressure continued to be elevated.

## 2017-09-10 NOTE — Assessment & Plan Note (Signed)
Just started her medication this morning. - Would continue the medication and counseled on taking her blood pressure. - Advised that if her blood pressure remains elevated to call back

## 2017-09-10 NOTE — Progress Notes (Signed)
Judy Huang - 52 y.o. female MRN [id number]  Date of birth: 03-29-1966  SUBJECTIVE:  Including CC & ROS.  Chief Complaint  Patient presents with  . Hypertension    Judy Huang is a 52 y.o. female that is presenting with hypertension and headaches. Symptoms have been ongoing for four days. She has had elevated blood pressure readings 156/97. This morning 134/102. Denies vision issues. She started taking Microzide this morning.     Review of Systems  Constitutional: Negative for fever.  HENT: Negative for sore throat.   Respiratory: Negative for cough.   Cardiovascular: Negative for chest pain.  Gastrointestinal: Negative for abdominal pain.  Musculoskeletal: Negative for gait problem.    HISTORY: Past Medical, Surgical, Social, and Family History Reviewed & Updated per EMR.   Pertinent Historical Findings include:  Past Medical History:  Diagnosis Date  . GERD (gastroesophageal reflux disease)   . History of gastroesophageal reflux (GERD)   . History of headache   . History of migraine   . History of UTI   . PONV (postoperative nausea and vomiting)     Past Surgical History:  Procedure Laterality Date  . BREAST REDUCTION SURGERY  2000  . COLONOSCOPY WITH PROPOFOL N/A 01/22/2017   Procedure: COLONOSCOPY WITH PROPOFOL;  Surgeon: [name], MD;  Location: [facility];  Service: Endoscopy;  Laterality: N/A;  . PARTIAL HYSTERECTOMY  1997  . POLYPECTOMY  01/22/2017   Procedure: POLYPECTOMY;  Surgeon: [name], MD;  Location: [facility];  Service: Endoscopy;;  . REDUCTION MAMMAPLASTY Bilateral 2000   BILAT    Allergies  Allergen Reactions  . Other     Dissolvable sutures cause "reaction"  . Percocet [Oxycodone-Acetaminophen] Nausea Only    Family History  Problem Relation Age of Onset  . Arthritis Mother   . Arthritis Father   . Hyperlipidemia Father   . Stroke Father   . High blood pressure Father   . Diabetes Father   . Arthritis  Maternal Grandfather   . Lung cancer Maternal Grandfather   . Arthritis Maternal Grandmother   . High blood pressure Maternal Grandmother   . Cancer Maternal Grandmother   . Cancer Maternal Uncle   . Breast cancer Neg Hx      Social History   Socioeconomic History  . Marital status: Single    Spouse name: Not on file  . Number of children: Not on file  . Years of education: Not on file  . Highest education level: Not on file  Social Needs  . Financial resource strain: Not on file  . Food insecurity - worry: Not on file  . Food insecurity - inability: Not on file  . Transportation needs - medical: Not on file  . Transportation needs - non-medical: Not on file  Occupational History  . Not on file  Tobacco Use  . Smoking status: Former Smoker    Start date: 07/26/2013    Last attempt to quit: 2016    Years since quitting: 3.1  . Smokeless tobacco: Never Used  Substance and Sexual Activity  . Alcohol use: No    Comment: rare  . Drug use: No  . Sexual activity: Not on file  Other Topics Concern  . Not on file  Social History Narrative  . Not on file     PHYSICAL EXAM:  VS: BP (!) 142/86 (BP Location: Left Arm, Patient Position: Sitting, Cuff Size: Normal)   Pulse 76   Temp 98.4 F (36.9  C) (Oral)   Ht 5' 4.75" (1.645 m)   Wt 181 lb (82.1 kg)   SpO2 97%   BMI 30.35 kg/m  Physical Exam Gen: NAD, alert, cooperative with exam, well-appearing ENT: normal lips, normal nasal mucosa,  Eye: normal EOM, normal conjunctiva and lids CV:  no edema, +2 pedal pulses  , regular rate and rhythm Resp: no accessory muscle use, non-labored, clear to auscultation Skin: no rashes, no areas of induration  Neuro: normal tone, normal sensation to touch Psych:  normal insight, alert and oriented MSK: Normal gait, normal strength     ASSESSMENT & PLAN:   Essential hypertension Just started her medication this morning. - Would continue the medication and counseled on taking her  blood pressure. - Advised that if her blood pressure remains elevated to call back

## 2017-09-10 NOTE — Telephone Encounter (Signed)
Pt calling to report elevated BP of 134/102. She had [facility] and was driving at the time of the call. Pt c/o "terrible" headache to the top of her head. Pt started HCTZ this am. Called PCP office to see if pt needed to be seen there or in ED as protocol advised. Spoke to [place] who asked NT to make appt to be seen. Appt made with Dr Clearance Coots today at 1:20 pm.   Reason for Disposition . [9] Systolic BP  >= 326 OR Diastolic >= 712 AND [4] cardiac or neurologic symptoms (e.g., chest pain, difficulty breathing, unsteady gait, blurred vision)  Answer Assessment - Initial Assessment Questions 1. BLOOD PRESSURE: "What is the blood pressure?" "Did you take at least two measurements 5 minutes apart?"     134/102 2. ONSET: "When did you take your blood pressure?"     1125 3. HOW: "How did you obtain the blood pressure?" (e.g., visiting nurse, automatic home BP monitor)     [facility] automatic BP machine 4. HISTORY: "Do you have a history of high blood pressure?"     No  5. MEDICATIONS: "Are you taking any medications for blood pressure?" "Have you missed any doses recently?"  started Diuretic this am was seen Monday and BP was high 6. OTHER SYMPTOMS: "Do you have any symptoms?" (e.g., headache, chest pain, blurred vision, difficulty breathing, weakness)     "terrible " headache top of head, fatigue 7. PREGNANCY: "Is there any chance you are pregnant?" "When was your last menstrual period?"     No  Protocols used: HIGH BLOOD PRESSURE-A-AH

## 2017-09-23 ENCOUNTER — Encounter: Payer: Self-pay | Admitting: Family Medicine

## 2017-09-23 ENCOUNTER — Ambulatory Visit (INDEPENDENT_AMBULATORY_CARE_PROVIDER_SITE_OTHER): Payer: Self-pay | Admitting: Family Medicine

## 2017-09-23 VITALS — BP 128/80 | HR 74 | Temp 98.4°F | Ht 64.75 in | Wt 179.0 lb

## 2017-09-23 DIAGNOSIS — I10 Essential (primary) hypertension: Secondary | ICD-10-CM

## 2017-09-23 NOTE — Progress Notes (Signed)
Subjective:   Patient ID: Judy Huang, female    DOB: 1966-05-24, 52 y.o.   MRN: [id number]  Judy Huang is a pleasant 52 y.o. year old female who presents to clinic today with Follow-up (Pt is here for a 2 week F/U for starting HCTZ)  on 09/23/2017  HPI:  HTN-  Started HCTZ 12.5 mg daily on 09/10/17. Here to follow this up today. Feels BP has been well controlled. Denies any side effects.  Current Outpatient Medications on File Prior to Visit  Medication Sig Dispense Refill  . alum & mag hydroxide-simeth (MAALOX/MYLANTA) 200-200-20 MG/5ML suspension Take by mouth every 6 (six) hours as needed for indigestion or heartburn.    . Apple Cider Vinegar 300 MG TABS Take by mouth.    . escitalopram (LEXAPRO) 20 MG tablet Take 1 tablet (20 mg total) by mouth daily. 90 tablet 0  . hydrochlorothiazide (MICROZIDE) 12.5 MG capsule Take 1 capsule (12.5 mg total) by mouth daily. 30 capsule 1  . Multiple Vitamin (MULTIVITAMIN) capsule Take 1 capsule by mouth daily.    . Probiotic Product (PROBIOTIC DAILY PO) Take by mouth.     No current facility-administered medications on file prior to visit.     Allergies  Allergen Reactions  . Other     Dissolvable sutures cause "reaction"  . Percocet [Oxycodone-Acetaminophen] Nausea Only    Past Medical History:  Diagnosis Date  . GERD (gastroesophageal reflux disease)   . History of gastroesophageal reflux (GERD)   . History of headache   . History of migraine   . History of UTI   . PONV (postoperative nausea and vomiting)     Past Surgical History:  Procedure Laterality Date  . BREAST REDUCTION SURGERY  2000  . COLONOSCOPY WITH PROPOFOL N/A 01/22/2017   Procedure: COLONOSCOPY WITH PROPOFOL;  Surgeon: [name], MD;  Location: [place];  Service: Endoscopy;  Laterality: N/A;  . PARTIAL HYSTERECTOMY  1997  . POLYPECTOMY  01/22/2017   Procedure: POLYPECTOMY;  Surgeon: [name], MD;  Location: [place];   Service: Endoscopy;;  . REDUCTION MAMMAPLASTY Bilateral 2000   BILAT    Family History  Problem Relation Age of Onset  . Arthritis Mother   . Arthritis Father   . Hyperlipidemia Father   . Stroke Father   . High blood pressure Father   . Diabetes Father   . Arthritis Maternal Grandfather   . Lung cancer Maternal Grandfather   . Arthritis Maternal Grandmother   . High blood pressure Maternal Grandmother   . Cancer Maternal Grandmother   . Cancer Maternal Uncle   . Breast cancer Neg Hx     Social History   Socioeconomic History  . Marital status: Single    Spouse name: Not on file  . Number of children: Not on file  . Years of education: Not on file  . Highest education level: Not on file  Social Needs  . Financial resource strain: Not on file  . Food insecurity - worry: Not on file  . Food insecurity - inability: Not on file  . Transportation needs - medical: Not on file  . Transportation needs - non-medical: Not on file  Occupational History  . Not on file  Tobacco Use  . Smoking status: Former Smoker    Start date: 07/26/2013    Last attempt to quit: 2016    Years since quitting: 3.1  . Smokeless tobacco: Never Used  Substance and Sexual Activity  .  Alcohol use: No    Comment: rare  . Drug use: No  . Sexual activity: Not on file  Other Topics Concern  . Not on file  Social History Narrative  . Not on file   The PMH, PSH, Social History, Family History, Medications, and allergies have been reviewed in [facility], and have been updated if relevant.   Review of Systems  HENT: Negative.   Eyes: Negative.   Respiratory: Negative.   Cardiovascular: Negative.   Musculoskeletal: Negative.   Neurological: Negative.   Hematological: Negative.   All other systems reviewed and are negative.      Objective:    BP 128/80 (BP Location: Left Arm, Patient Position: Sitting, Cuff Size: Normal)   Pulse 74   Temp 98.4 F (36.9 C) (Oral)   Ht 5' 4.75" (1.645 m)   Wt 179  lb (81.2 kg)   SpO2 97%   BMI 30.02 kg/m    Physical Exam  Constitutional: She is oriented to person, place, and time. She appears well-developed and well-nourished. No distress.  HENT:  Head: Normocephalic and atraumatic.  Eyes: Conjunctivae are normal.  Cardiovascular: Normal rate.  Pulmonary/Chest: Effort normal.  Musculoskeletal: Normal range of motion. She exhibits no edema.  Neurological: She is alert and oriented to person, place, and time. No cranial nerve deficit.  Skin: Skin is warm and dry. She is not diaphoretic.  Psychiatric: She has a normal mood and affect. Her behavior is normal. Judgment and thought content normal.  Vitals reviewed.         Assessment & Plan:   Essential hypertension No Follow-up on file.

## 2017-09-23 NOTE — Assessment & Plan Note (Signed)
Well controlled on current rx. No changes made. 

## 2017-10-06 ENCOUNTER — Other Ambulatory Visit: Payer: Self-pay

## 2017-10-06 MED ORDER — HYDROCHLOROTHIAZIDE 12.5 MG PO CAPS: 12.5000 mg | ORAL_CAPSULE | Freq: Every day | ORAL | 0 refills | Status: DC

## 2018-01-22 ENCOUNTER — Other Ambulatory Visit: Payer: Self-pay | Admitting: Family Medicine

## 2018-07-27 ENCOUNTER — Other Ambulatory Visit: Payer: Self-pay | Admitting: Family Medicine

## 2018-08-18 ENCOUNTER — Ambulatory Visit: Payer: Self-pay | Admitting: Family Medicine

## 2018-08-18 ENCOUNTER — Encounter: Payer: Self-pay | Admitting: Family Medicine

## 2018-08-18 DIAGNOSIS — H6980 Other specified disorders of Eustachian tube, unspecified ear: Secondary | ICD-10-CM | POA: Insufficient documentation

## 2018-08-18 DIAGNOSIS — H6982 Other specified disorders of Eustachian tube, left ear: Secondary | ICD-10-CM

## 2018-08-18 DIAGNOSIS — J0100 Acute maxillary sinusitis, unspecified: Secondary | ICD-10-CM

## 2018-08-18 DIAGNOSIS — H6990 Unspecified Eustachian tube disorder, unspecified ear: Secondary | ICD-10-CM | POA: Insufficient documentation

## 2018-08-18 MED ORDER — AMOXICILLIN-POT CLAVULANATE 875-125 MG PO TABS: 1.0000 | ORAL_TABLET | Freq: Two times a day (BID) | ORAL | 0 refills | Status: DC

## 2018-08-18 NOTE — Assessment & Plan Note (Signed)
-  Recommend use of mucinex and decongestant with daily flonase. -Increase fluid intake -May use meclizine if needed as well for dizziness.

## 2018-08-18 NOTE — Patient Instructions (Signed)
-Start antibiotic, take until all gone.  -Push fluids -Continue humidifier/vaporizer -Mucinex-D and daily flonase may be helpful for fluid in ear -If you continue to feel dizzy, meclizine (bonine or dramamine less drowsy) may be helpful for this.     Sinusitis, Adult Sinusitis is soreness and swelling (inflammation) of your sinuses. Sinuses are hollow spaces in the bones around your face. They are located:  Around your eyes.  In the middle of your forehead.  Behind your nose.  In your cheekbones. Your sinuses and nasal passages are lined with a fluid called mucus. Mucus drains out of your sinuses. Swelling can trap mucus in your sinuses. This lets germs (bacteria, virus, or fungus) grow, which leads to infection. Most of the time, this condition is caused by a virus. What are the causes? This condition is caused by:  Allergies.  Asthma.  Germs.  Things that block your nose or sinuses.  Growths in the nose (nasal polyps).  Chemicals or irritants in the air.  Fungus (rare). What increases the risk? You are more likely to develop this condition if:  You have a weak body defense system (immune system).  You do a lot of swimming or diving.  You use nasal sprays too much.  You smoke. What are the signs or symptoms? The main symptoms of this condition are pain and a feeling of pressure around the sinuses. Other symptoms include:  Stuffy nose (congestion).  Runny nose (drainage).  Swelling and warmth in the sinuses.  Headache.  Toothache.  A cough that may get worse at night.  Mucus that collects in the throat or the back of the nose (postnasal drip).  Being unable to smell and taste.  Being very tired (fatigue).  A fever.  Sore throat.  Bad breath. How is this diagnosed? This condition is diagnosed based on:  Your symptoms.  Your medical history.  A physical exam.  Tests to find out if your condition is short-term (acute) or long-term  (chronic). Your doctor may: ? Check your nose for growths (polyps). ? Check your sinuses using a tool that has a light (endoscope). ? Check for allergies or germs. ? Do imaging tests, such as an MRI or CT scan. How is this treated? Treatment for this condition depends on the cause and whether it is short-term or long-term.  If caused by a virus, your symptoms should go away on their own within 10 days. You may be given medicines to relieve symptoms. They include: ? Medicines that shrink swollen tissue in the nose. ? Medicines that treat allergies (antihistamines). ? A spray that treats swelling of the nostrils. ? Rinses that help get rid of thick mucus in your nose (nasal saline washes).  If caused by bacteria, your doctor may wait to see if you will get better without treatment. You may be given antibiotic medicine if you have: ? A very bad infection. ? A weak body defense system.  If caused by growths in the nose, you may need to have surgery. Follow these instructions at home: Medicines  Take, use, or apply over-the-counter and prescription medicines only as told by your doctor. These may include nasal sprays.  If you were prescribed an antibiotic medicine, take it as told by your doctor. Do not stop taking the antibiotic even if you start to feel better. Hydrate and humidify   Drink enough water to keep your pee (urine) pale yellow.  Use a cool mist humidifier to keep the humidity level in your home  above 50%.  Breathe in steam for 10-15 minutes, 3-4 times a day, or as told by your doctor. You can do this in the bathroom while a hot shower is running.  Try not to spend time in cool or dry air. Rest  Rest as much as you can.  Sleep with your head raised (elevated).  Make sure you get enough sleep each night. General instructions   Put a warm, moist washcloth on your face 3-4 times a day, or as often as told by your doctor. This will help with discomfort.  Wash your  hands often with soap and water. If there is no soap and water, use hand sanitizer.  Do not smoke. Avoid being around people who are smoking (secondhand smoke).  Keep all follow-up visits as told by your doctor. This is important. Contact a doctor if:  You have a fever.  Your symptoms get worse.  Your symptoms do not get better within 10 days. Get help right away if:  You have a very bad headache.  You cannot stop throwing up (vomiting).  You have very bad pain or swelling around your face or eyes.  You have trouble seeing.  You feel confused.  Your neck is stiff.  You have trouble breathing. Summary  Sinusitis is swelling of your sinuses. Sinuses are hollow spaces in the bones around your face.  This condition is caused by tissues in your nose that become inflamed or swollen. This traps germs. These can lead to infection.  If you were prescribed an antibiotic medicine, take it as told by your doctor. Do not stop taking it even if you start to feel better.  Keep all follow-up visits as told by your doctor. This is important. This information is not intended to replace advice given to you by your health care provider. Make sure you discuss any questions you have with your health care provider. Document Released: 12/23/2007 Document Revised: 12/06/2017 Document Reviewed: 12/06/2017 Elsevier Interactive Patient Education  2019 [employer].

## 2018-08-18 NOTE — Assessment & Plan Note (Signed)
-  Rx for augmentin -Continued supportive care recommended.  -Return if not improving or for continued worsening of symptoms.

## 2018-08-18 NOTE — Progress Notes (Signed)
Judy Huang - 53 y.o. female MRN [id number]  Date of birth: 07/10/1966  Subjective Chief Complaint  Patient presents with  . URI    has been ongoing for one week. Admits to body aches and ear rininging. Dizziness.    HPI  Judy Huang is a 53 y.o. female who complains of congestion, nasal blockage, post nasal drip, dry cough, right maxillary sinus pain, fever and ear pressure with ringing and dizziness for 8 days.  She has tried zicam and dayquil/nyquil with limited relief.  She denies a history of chest pain, vomiting, weakness, wheezing and sputum production and denies a history of asthma. Patient does not smoke cigarettes.  ROS:  A comprehensive ROS was completed and negative except as noted per HPI     Allergies  Allergen Reactions  . Other     Dissolvable sutures cause "reaction"  . Percocet [Oxycodone-Acetaminophen] Nausea Only    Past Medical History:  Diagnosis Date  . GERD (gastroesophageal reflux disease)   . History of gastroesophageal reflux (GERD)   . History of headache   . History of migraine   . History of UTI   . PONV (postoperative nausea and vomiting)     Past Surgical History:  Procedure Laterality Date  . BREAST REDUCTION SURGERY  2000  . COLONOSCOPY WITH PROPOFOL N/A 01/22/2017   Procedure: COLONOSCOPY WITH PROPOFOL;  Surgeon: [name], MD;  Location: [place];  Service: Endoscopy;  Laterality: N/A;  . PARTIAL HYSTERECTOMY  1997  . POLYPECTOMY  01/22/2017   Procedure: POLYPECTOMY;  Surgeon: [name], MD;  Location: [place];  Service: Endoscopy;;  . REDUCTION MAMMAPLASTY Bilateral 2000   BILAT    Social History   Socioeconomic History  . Marital status: Single    Spouse name: Not on file  . Number of children: Not on file  . Years of education: Not on file  . Highest education level: Not on file  Occupational History  . Not on file  Social Needs  . Financial resource strain: Not on file  . Food  insecurity:    Worry: Not on file    Inability: Not on file  . Transportation needs:    Medical: Not on file    Non-medical: Not on file  Tobacco Use  . Smoking status: Former Smoker    Start date: 07/26/2013    Last attempt to quit: 2016    Years since quitting: 4.0  . Smokeless tobacco: Never Used  Substance and Sexual Activity  . Alcohol use: No    Comment: rare  . Drug use: No  . Sexual activity: Not on file  Lifestyle  . Physical activity:    Days per week: Not on file    Minutes per session: Not on file  . Stress: Not on file  Relationships  . Social connections:    Talks on phone: Not on file    Gets together: Not on file    Attends religious service: Not on file    Active member of club or organization: Not on file    Attends meetings of clubs or organizations: Not on file    Relationship status: Not on file  Other Topics Concern  . Not on file  Social History Narrative  . Not on file    Family History  Problem Relation Age of Onset  . Arthritis Mother   . Arthritis Father   . Hyperlipidemia Father   . Stroke Father   . High  blood pressure Father   . Diabetes Father   . Arthritis Maternal Grandfather   . Lung cancer Maternal Grandfather   . Arthritis Maternal Grandmother   . High blood pressure Maternal Grandmother   . Cancer Maternal Grandmother   . Cancer Maternal Uncle   . Breast cancer Neg Hx     Health Maintenance  Topic Date Due  . HIV Screening  02/17/1980  . MAMMOGRAM  05/14/2017  . INFLUENZA VACCINE  04/29/2019 (Originally 02/17/2018)  . PAP SMEAR-Modifier  09/11/2019  . COLONOSCOPY  01/23/2027  . TETANUS/TDAP  09/07/2027    ----------------------------------------------------------------------------------------------------------------------------------------------------------------------------------------------------------------- Physical Exam BP 124/76   Pulse 73   Temp 98.1 F (36.7 C) (Oral)   Ht 5' 4.75" (1.645 m)   Wt 181 lb  (82.1 kg)   SpO2 97%   BMI 30.35 kg/m   Physical Exam Constitutional:      Appearance: Normal appearance. She is not toxic-appearing.  HENT:     Head: Normocephalic and atraumatic.     Ears:     Comments: Serous effusion L middle ear.  R TM normal.     Nose: Congestion present.     Comments: R maxillary sinus tenderness.     Mouth/Throat:     Mouth: Mucous membranes are moist.  Eyes:     General: No scleral icterus. Neck:     Musculoskeletal: Neck supple.  Cardiovascular:     Rate and Rhythm: Normal rate and regular rhythm.  Pulmonary:     Effort: Pulmonary effort is normal.     Breath sounds: Normal breath sounds. No wheezing.  Lymphadenopathy:     Cervical: No cervical adenopathy.  Skin:    General: Skin is warm and dry.     Findings: No rash.  Neurological:     General: No focal deficit present.     Mental Status: She is alert.  Psychiatric:        Mood and Affect: Mood normal.        Behavior: Behavior normal.     ------------------------------------------------------------------------------------------------------------------------------------------------------------------------------------------------------------------- Assessment and Plan  Eustachian tube dysfunction -Recommend use of mucinex and decongestant with daily flonase. -Increase fluid intake -May use meclizine if needed as well for dizziness.   Acute maxillary sinusitis -Rx for augmentin -Continued supportive care recommended.  -Return if not improving or for continued worsening of symptoms.

## 2018-08-22 ENCOUNTER — Ambulatory Visit: Payer: Self-pay

## 2018-08-22 NOTE — Telephone Encounter (Signed)
Dr. Zigmund Daniel please advise. Pt was seen in the office 08/19/2018. Started on Augmentin, Flonase, and Musinex pt. States she still feels horrible and requests a new antibiotic and cant afford to be seen again.

## 2018-08-22 NOTE — Telephone Encounter (Signed)
Pt verbalized understanding of instructions and knows that if she is still not feeling well in the next couple days to give [name] a call

## 2018-08-22 NOTE — Telephone Encounter (Signed)
I would recommend continuing augmentin for now as it's only been a couple of days, push fluids and continue supportive care.  Let me know if this is still not improving over the next couple of days.

## 2018-08-22 NOTE — Telephone Encounter (Signed)
Pt. Reports she was seen Friday. 08/19/18. Started on Augmentin, Flonase and Mucinex, and "still feels horrible - left ear feels full, headache, face ache, body aches." States she can not afford to come back in for another visit. Requesting a different antibiotic be sent to [facility] [address] In [place]. Please let pt. Know. Answer Assessment - Initial Assessment Questions 1. ANTIBIOTIC: "What antibiotic are you receiving?" "How many times per day?"     Augmentin 2. ONSET: "When was the antibiotic started?"     Friday 08/19/18 3. PAIN: "How bad is the sinus pain?"   (Scale 1-10; mild, moderate or severe)   - MILD (1-3): doesn't interfere with normal activities    - MODERATE (4-7): interferes with normal activities (e.g., work or school) or awakens from sleep   - SEVERE (8-10): excruciating pain and patient unable to do any normal activities        7 4. FEVER: "Do you have a fever?" If so, ask: "What is it, how was it measured, and when did it start?"      Feels feverish 5. SYMPTOMS: "Are there any other symptoms you're concerned about?" If so, ask: "When did it start?"     Left still feels full, headache. Body aches. Glasses hurt her face 6. PREGNANCY: "Is there any chance you are pregnant?" "When was your last menstrual period?"     No  Protocols used: SINUS INFECTION ON ANTIBIOTIC FOLLOW-UP CALL-A-AH

## 2018-08-31 ENCOUNTER — Other Ambulatory Visit: Payer: Self-pay | Admitting: Family Medicine

## 2018-08-31 DIAGNOSIS — Z1231 Encounter for screening mammogram for malignant neoplasm of breast: Secondary | ICD-10-CM

## 2018-09-06 ENCOUNTER — Encounter: Payer: Self-pay | Admitting: Family Medicine

## 2018-09-06 ENCOUNTER — Ambulatory Visit: Payer: Self-pay | Admitting: Family Medicine

## 2018-09-06 VITALS — BP 154/104 | HR 70 | Temp 98.3°F | Ht 65.0 in | Wt 180.2 lb

## 2018-09-06 DIAGNOSIS — R002 Palpitations: Secondary | ICD-10-CM | POA: Insufficient documentation

## 2018-09-06 DIAGNOSIS — F329 Major depressive disorder, single episode, unspecified: Secondary | ICD-10-CM

## 2018-09-06 DIAGNOSIS — F32A Depression, unspecified: Secondary | ICD-10-CM

## 2018-09-06 DIAGNOSIS — I10 Essential (primary) hypertension: Secondary | ICD-10-CM

## 2018-09-06 DIAGNOSIS — Z0000 Encounter for general adult medical examination without abnormal findings: Secondary | ICD-10-CM | POA: Insufficient documentation

## 2018-09-06 LAB — CBC WITH DIFFERENTIAL/PLATELET
BASOS PCT: 0.7 % (ref 0.0–3.0)
Basophils Absolute: 0.1 10*3/uL (ref 0.0–0.1)
Eosinophils Absolute: 0.4 10*3/uL (ref 0.0–0.7)
Eosinophils Relative: 5.3 % — ABNORMAL HIGH (ref 0.0–5.0)
HEMATOCRIT: 42 % (ref 36.0–46.0)
Hemoglobin: 14.4 g/dL (ref 12.0–15.0)
Lymphocytes Relative: 31.8 % (ref 12.0–46.0)
Lymphs Abs: 2.7 10*3/uL (ref 0.7–4.0)
MCHC: 34.2 g/dL (ref 30.0–36.0)
MCV: 91.5 fl (ref 78.0–100.0)
Monocytes Absolute: 0.4 10*3/uL (ref 0.1–1.0)
Monocytes Relative: 4.8 % (ref 3.0–12.0)
Neutro Abs: 4.8 10*3/uL (ref 1.4–7.7)
Neutrophils Relative %: 57.4 % (ref 43.0–77.0)
Platelets: 397 10*3/uL (ref 150.0–400.0)
RBC: 4.59 Mil/uL (ref 3.87–5.11)
RDW: 12.5 % (ref 11.5–15.5)
WBC: 8.4 10*3/uL (ref 4.0–10.5)

## 2018-09-06 LAB — COMPREHENSIVE METABOLIC PANEL WITH GFR
ALT: 22 U/L (ref 0–35)
AST: 17 U/L (ref 0–37)
Albumin: 4.5 g/dL (ref 3.5–5.2)
Alkaline Phosphatase: 83 U/L (ref 39–117)
BUN: 12 mg/dL (ref 6–23)
CO2: 30 meq/L (ref 19–32)
Calcium: 10.1 mg/dL (ref 8.4–10.5)
Chloride: 104 meq/L (ref 96–112)
Creatinine, Ser: 0.78 mg/dL (ref 0.40–1.20)
GFR: 77.09 mL/min
Glucose, Bld: 83 mg/dL (ref 70–99)
Potassium: 4.3 meq/L (ref 3.5–5.1)
Sodium: 139 meq/L (ref 135–145)
Total Bilirubin: 0.3 mg/dL (ref 0.2–1.2)
Total Protein: 7.9 g/dL (ref 6.0–8.3)

## 2018-09-06 LAB — TSH: TSH: 3.13 u[IU]/mL (ref 0.35–4.50)

## 2018-09-06 LAB — TROPONIN I: TNIDX: 0 ug/l (ref 0.00–0.06)

## 2018-09-06 LAB — T4, FREE: Free T4: 0.74 ng/dL (ref 0.60–1.60)

## 2018-09-06 LAB — FERRITIN: Ferritin: 40.2 ng/mL (ref 10.0–291.0)

## 2018-09-06 MED ORDER — ESCITALOPRAM OXALATE 20 MG PO TABS: 20.0000 mg | ORAL_TABLET | Freq: Every day | ORAL | 3 refills | Status: DC

## 2018-09-06 MED ORDER — HYDROCHLOROTHIAZIDE 12.5 MG PO CAPS: 12.5000 mg | ORAL_CAPSULE | Freq: Every day | ORAL | 3 refills | Status: DC

## 2018-09-06 NOTE — Assessment & Plan Note (Signed)
New- intermittent. EKG showing NSR today. Could be related to elevated BP and anxiety. If in two weeks, she is still having symptoms- after we've check blood work, restarted her HCTZ and lexapro, will refer to cardiology for holter. The patient indicates understanding of these issues and agrees with the plan. Orders Placed This Encounter  Procedures  . Comprehensive metabolic panel  . CBC with Differential/Platelet  . TSH  . T4, free  . Ferritin  . EKG 12-Lead

## 2018-09-06 NOTE — Assessment & Plan Note (Signed)
Deteriorated. Restart HCTZ 12.5 mg daily. Follow up in 2 weeks. The patient indicates understanding of these issues and agrees with the plan.

## 2018-09-06 NOTE — Patient Instructions (Signed)
Great to see you. I will call you with your lab results from today and you can view them online.   Please follow up with me in 2 weeks to recheck your blood pressure.

## 2018-09-06 NOTE — Progress Notes (Signed)
Subjective:   Patient ID: Judy Huang, female    DOB: 05-13-1966, 54 y.o.   MRN: [id number]  Judy Huang is a pleasant 53 y.o. year old female who presents to clinic today with Hypertension; Depression; and irregular heart rhythm  on 09/06/2018  HPI:  HTN- Last time I saw her on 09/23/17- she was taking HCTZ 12.5 mg daily for HTN and lexapro for depression.  Has been out of both for months. Can tell her BP has been running high, having headaches, tinnitus, heart feels like it is skipping beats. Lab Results  Component Value Date   CREATININE 0.79 09/06/2017   BP Readings from Last 3 Encounters:  09/06/18 (!) 154/104  08/18/18 124/76  09/23/17 128/80    Depression- has been stable on Lexapro 20 mg daily for years. Denies symptoms of anxiety/depression.  GAD 7 : Generalized Anxiety Score 09/06/2018  Nervous, Anxious, on Edge 3  Control/stop worrying 3  Worry too much - different things 3  Trouble relaxing 3  Restless 3  Easily annoyed or irritable 3  Afraid - awful might happen 1  Total GAD 7 Score 19  Anxiety Difficulty Very difficult     Depression screen [facility] 2/9 09/06/2018 09/06/2017 05/06/2015  Decreased Interest 3 0 1  Down, Depressed, Hopeless 1 0 1  PHQ - 2 Score 4 0 2  Altered sleeping 3 - 1  Tired, decreased energy 3 - 1  Change in appetite 3 - 1  Feeling bad or failure about yourself  1 - 0  Trouble concentrating 3 - 1  Moving slowly or fidgety/restless 3 - 0  Suicidal thoughts 0 - 0  PHQ-9 Score 20 - 6  Difficult doing work/chores Very difficult - Somewhat difficult     Current Outpatient Medications on File Prior to Visit  Medication Sig Dispense Refill  . Multiple Vitamin (MULTIVITAMIN) capsule Take 1 capsule by mouth daily.    . Probiotic Product (PROBIOTIC DAILY PO) Take by mouth.     No current facility-administered medications on file prior to visit.     Allergies  Allergen Reactions  . Other     Dissolvable sutures cause "reaction"    . Percocet [Oxycodone-Acetaminophen] Nausea Only    Past Medical History:  Diagnosis Date  . GERD (gastroesophageal reflux disease)   . History of gastroesophageal reflux (GERD)   . History of headache   . History of migraine   . History of UTI   . PONV (postoperative nausea and vomiting)     Past Surgical History:  Procedure Laterality Date  . BREAST REDUCTION SURGERY  2000  . COLONOSCOPY WITH PROPOFOL N/A 01/22/2017   Procedure: COLONOSCOPY WITH PROPOFOL;  Surgeon: [name], MD;  Location: [place];  Service: Endoscopy;  Laterality: N/A;  . PARTIAL HYSTERECTOMY  1997  . POLYPECTOMY  01/22/2017   Procedure: POLYPECTOMY;  Surgeon: [name], MD;  Location: [place];  Service: Endoscopy;;  . REDUCTION MAMMAPLASTY Bilateral 2000   BILAT    Family History  Problem Relation Age of Onset  . Arthritis Mother   . Arthritis Father   . Hyperlipidemia Father   . Stroke Father   . High blood pressure Father   . Diabetes Father   . Arthritis Maternal Grandfather   . Lung cancer Maternal Grandfather   . Arthritis Maternal Grandmother   . High blood pressure Maternal Grandmother   . Cancer Maternal Grandmother   . Cancer Maternal Uncle   . Breast cancer  Neg Hx     Social History   Socioeconomic History  . Marital status: Single    Spouse name: Not on file  . Number of children: Not on file  . Years of education: Not on file  . Highest education level: Not on file  Occupational History  . Not on file  Social Needs  . Financial resource strain: Not on file  . Food insecurity:    Worry: Not on file    Inability: Not on file  . Transportation needs:    Medical: Not on file    Non-medical: Not on file  Tobacco Use  . Smoking status: Former Smoker    Start date: 07/26/2013    Last attempt to quit: 2016    Years since quitting: 4.1  . Smokeless tobacco: Never Used  Substance and Sexual Activity  . Alcohol use: No    Comment: rare  . Drug use:  No  . Sexual activity: Not on file  Lifestyle  . Physical activity:    Days per week: Not on file    Minutes per session: Not on file  . Stress: Not on file  Relationships  . Social connections:    Talks on phone: Not on file    Gets together: Not on file    Attends religious service: Not on file    Active member of club or organization: Not on file    Attends meetings of clubs or organizations: Not on file    Relationship status: Not on file  . Intimate partner violence:    Fear of current or ex partner: Not on file    Emotionally abused: Not on file    Physically abused: Not on file    Forced sexual activity: Not on file  Other Topics Concern  . Not on file  Social History Narrative  . Not on file   The PMH, PSH, Social History, Family History, Medications, and allergies have been reviewed in [facility], and have been updated if relevant.   Review of Systems  Constitutional: Negative.   HENT: Negative.   Eyes: Negative.   Respiratory: Negative.   Cardiovascular: Positive for chest pain, palpitations and leg swelling.  Gastrointestinal: Negative.   Endocrine: Negative.   Genitourinary: Negative.   Musculoskeletal: Negative.   Skin: Negative.   Allergic/Immunologic: Negative.   Neurological: Positive for headaches.  Hematological: Negative.   Psychiatric/Behavioral: Negative.   All other systems reviewed and are negative.      Objective:    BP (!) 154/104 (BP Location: Left Arm, Cuff Size: Normal)   Pulse 70   Temp 98.3 F (36.8 C) (Oral)   Ht 5\' 5"  (1.651 m)   Wt 180 lb 3.2 oz (81.7 kg)   SpO2 97%   BMI 29.99 kg/m    Physical Exam   General:  Well-developed,well-nourished,in no acute distress; alert,appropriate and cooperative throughout examination Head:  normocephalic and atraumatic.   Eyes:  vision grossly intact, PERRL Ears:  R ear normal and L ear normal externally, TMs clear bilaterally Nose:  no external deformity.   Mouth:  good dentition.    Neck:  No deformities, masses, or tenderness noted. Lungs:  Normal respiratory effort, chest expands symmetrically. Lungs are clear to auscultation, no crackles or wheezes. Heart:  Normal rate and regular rhythm. S1 and S2 normal without gallop, murmur, click, rub or other extra sounds. Abdomen:  Bowel sounds positive,abdomen soft and non-tender without masses, organomegaly or hernias noted. Msk:  No deformity or scoliosis  noted of thoracic or lumbar spine.   Extremities:  No clubbing, cyanosis, edema, or deformity noted with normal full range of motion of all joints.   Neurologic:  alert & oriented X3 and gait normal.   Skin:  Intact without suspicious lesions or rashes Cervical Nodes:  No lymphadenopathy noted Axillary Nodes:  No palpable lymphadenopathy Psych:  Cognition and judgment appear intact. Alert and cooperative with normal attention span and concentration. No apparent delusions, illusions, hallucinations       Assessment & Plan:   Essential hypertension - Plan: EKG 12-Lead, Comprehensive metabolic panel, CBC with Differential/Platelet, TSH, T4, free, Ferritin  Depression, unspecified depression type  Palpitations - Plan: Troponin I Return in about 2 weeks (around 09/20/2018) for blood pressure recheck., follow up anxiety and depression., medication follow up.[name]

## 2018-09-06 NOTE — Assessment & Plan Note (Signed)
Deteriorated. Lexapro was controlling her symptoms well. Will restart her previous dose of lexapro- eRx refills sent. She will follow up in 2 weeks.

## 2018-09-07 ENCOUNTER — Other Ambulatory Visit: Payer: Self-pay

## 2018-09-07 MED ORDER — ESCITALOPRAM OXALATE 20 MG PO TABS: 20.0000 mg | ORAL_TABLET | Freq: Every day | ORAL | 0 refills | Status: DC

## 2018-09-19 ENCOUNTER — Ambulatory Visit
Admission: RE | Admit: 2018-09-19 | Discharge: 2018-09-19 | Disposition: A | Discharge status: Home / Self Care | Payer: Self-pay | Source: Ambulatory Visit | Attending: Family Medicine | Admitting: Family Medicine

## 2018-09-19 DIAGNOSIS — Z1231 Encounter for screening mammogram for malignant neoplasm of breast: Secondary | ICD-10-CM | POA: Insufficient documentation

## 2018-09-19 NOTE — Progress Notes (Signed)
Subjective:   Patient ID: Judy Huang, female    DOB: 02-Jul-1966, 53 y.o.   MRN: [id number]  Judy Huang is a pleasant 53 y.o. year old female who presents to clinic today with Follow-up (Patient is here today for a 2-week-F/U for HTN & anxiety.changed meds taking am & Pm , constant sleeping)  on 09/20/2018  HPI:  Here for follow up.  Last saw patient on 09/06/18.  Note reviewed.  At that [place], she had been out of HCTZ 12.5 mg daily and lexapro 20 mg daily for months. She was symptomatic of high blood pressure- headaches, tinnitus, heart felrt like it was skipping beats.  PHQ9 and GAD7 screens were very high.  She does already feel a bit better but has been has been worried about her daughter.  Depression screen [facility] 2/9 09/20/2018 09/06/2018 09/06/2017 05/06/2015  Decreased Interest 1 3 0 1  Down, Depressed, Hopeless 1 1 0 1  PHQ - 2 Score 2 4 0 2  Altered sleeping 3 3 - 1  Tired, decreased energy 3 3 - 1  Change in appetite 2 3 - 1  Feeling bad or failure about yourself  1 1 - 0  Trouble concentrating 2 3 - 1  Moving slowly or fidgety/restless 3 3 - 0  Suicidal thoughts 0 0 - 0  PHQ-9 Score 16 20 - 6  Difficult doing work/chores - Very difficult - Somewhat difficult   GAD 7 : Generalized Anxiety Score 09/20/2018 09/06/2018  Nervous, Anxious, on Edge 3 3  Control/stop worrying 3 3  Worry too much - different things 3 3  Trouble relaxing 0 3  Restless 0 3  Easily annoyed or irritable 1 3  Afraid - awful might happen 3 1  Total GAD 7 Score 13 19  Anxiety Difficulty - Very difficult      BP was also elevated.  EKG was reassuring as was a Troponin I,CMET, TSH, FT4, ferritin and CBC.  BP Readings from Last 3 Encounters:  09/20/18 116/82  09/06/18 (!) 154/104  08/18/18 124/76   We restarted HCTZ 12.5 mg daily and lexapro 20 mg daily and advised her to follow up with me today.  Recent Results (from the past 2160 hour(s))  Comprehensive metabolic panel     Status: None     Collection Time: 09/06/18 11:31 AM  Result Value Ref Range   Sodium 139 135 - 145 mEq/L   Potassium 4.3 3.5 - 5.1 mEq/L   Chloride 104 96 - 112 mEq/L   CO2 30 19 - 32 mEq/L   Glucose, Bld 83 70 - 99 mg/dL   BUN 12 6 - 23 mg/dL   Creatinine, Ser 0.78 0.40 - 1.20 mg/dL   Total Bilirubin 0.3 0.2 - 1.2 mg/dL   Alkaline Phosphatase 83 39 - 117 U/L   AST 17 0 - 37 U/L   ALT 22 0 - 35 U/L   Total Protein 7.9 6.0 - 8.3 g/dL   Albumin 4.5 3.5 - 5.2 g/dL   Calcium 10.1 8.4 - 10.5 mg/dL   GFR 77.09 >60.00 mL/min  CBC with Differential/Platelet     Status: Abnormal   Collection Time: 09/06/18 11:31 AM  Result Value Ref Range   WBC 8.4 4.0 - 10.5 K/uL   RBC 4.59 3.87 - 5.11 Mil/uL   Hemoglobin 14.4 12.0 - 15.0 g/dL   HCT 42.0 36.0 - 46.0 %   MCV 91.5 78.0 - 100.0 fl   MCHC 34.2 30.0 -  36.0 g/dL   RDW 12.5 11.5 - 15.5 %   Platelets 397.0 150.0 - 400.0 K/uL   Neutrophils Relative % 57.4 43.0 - 77.0 %   Lymphocytes Relative 31.8 12.0 - 46.0 %   Monocytes Relative 4.8 3.0 - 12.0 %   Eosinophils Relative 5.3 (H) 0.0 - 5.0 %   Basophils Relative 0.7 0.0 - 3.0 %   Neutro Abs 4.8 1.4 - 7.7 K/uL   Lymphs Abs 2.7 0.7 - 4.0 K/uL   Monocytes Absolute 0.4 0.1 - 1.0 K/uL   Eosinophils Absolute 0.4 0.0 - 0.7 K/uL   Basophils Absolute 0.1 0.0 - 0.1 K/uL  TSH     Status: None   Collection Time: 09/06/18 11:31 AM  Result Value Ref Range   TSH 3.13 0.35 - 4.50 uIU/mL  T4, free     Status: None   Collection Time: 09/06/18 11:31 AM  Result Value Ref Range   Free T4 0.74 0.60 - 1.60 ng/dL    Comment: Specimens from patients who are undergoing biotin therapy and /or ingesting biotin supplements may contain high levels of biotin.  The higher biotin concentration in these specimens interferes with this Free T4 assay.  Specimens that contain high levels  of biotin may cause false high results for this Free T4 assay.  Please interpret results in light of the total clinical presentation of the patient.     Ferritin     Status: None   Collection Time: 09/06/18 11:31 AM  Result Value Ref Range   Ferritin 40.2 10.0 - 291.0 ng/mL  Troponin I     Status: None   Collection Time: 09/06/18 11:31 AM  Result Value Ref Range   TNIDX 0.00 0.00 - 0.06 ug/l   Current Outpatient Medications on File Prior to Visit  Medication Sig Dispense Refill  . clindamycin (CLEOCIN T) 1 % lotion Apply to palms and soles 1-2 times daily    . escitalopram (LEXAPRO) 20 MG tablet Take 1 tablet (20 mg total) by mouth daily. 90 tablet 0  . hydrochlorothiazide (MICROZIDE) 12.5 MG capsule Take 1 capsule (12.5 mg total) by mouth daily. 30 capsule 3  . Multiple Vitamin (MULTIVITAMIN) capsule Take 1 capsule by mouth daily.    . Probiotic Product (PROBIOTIC DAILY PO) Take by mouth.     No current facility-administered medications on file prior to visit.     Allergies  Allergen Reactions  . Other     Dissolvable sutures cause "reaction"  . Percocet [Oxycodone-Acetaminophen] Nausea Only    Past Medical History:  Diagnosis Date  . GERD (gastroesophageal reflux disease)   . History of gastroesophageal reflux (GERD)   . History of headache   . History of migraine   . History of UTI   . PONV (postoperative nausea and vomiting)     Past Surgical History:  Procedure Laterality Date  . BREAST REDUCTION SURGERY  2000  . COLONOSCOPY WITH PROPOFOL N/A 01/22/2017   Procedure: COLONOSCOPY WITH PROPOFOL;  Surgeon: [name], MD;  Location: [place];  Service: Endoscopy;  Laterality: N/A;  . PARTIAL HYSTERECTOMY  1997  . POLYPECTOMY  01/22/2017   Procedure: POLYPECTOMY;  Surgeon: [name], MD;  Location: [place];  Service: Endoscopy;;  . REDUCTION MAMMAPLASTY Bilateral 2000   BILAT    Family History  Problem Relation Age of Onset  . Arthritis Mother   . Arthritis Father   . Hyperlipidemia Father   . Stroke Father   . High blood pressure  Father   . Diabetes Father   . Arthritis Maternal  Grandfather   . Lung cancer Maternal Grandfather   . Arthritis Maternal Grandmother   . High blood pressure Maternal Grandmother   . Cancer Maternal Grandmother   . Cancer Maternal Uncle   . Breast cancer Neg Hx     Social History   Socioeconomic History  . Marital status: Single    Spouse name: Not on file  . Number of children: Not on file  . Years of education: Not on file  . Highest education level: Not on file  Occupational History  . Not on file  Social Needs  . Financial resource strain: Not on file  . Food insecurity:    Worry: Not on file    Inability: Not on file  . Transportation needs:    Medical: Not on file    Non-medical: Not on file  Tobacco Use  . Smoking status: Former Smoker    Start date: 07/26/2013    Last attempt to quit: 2016    Years since quitting: 4.1  . Smokeless tobacco: Never Used  Substance and Sexual Activity  . Alcohol use: No    Comment: rare  . Drug use: No  . Sexual activity: Not on file  Lifestyle  . Physical activity:    Days per week: Not on file    Minutes per session: Not on file  . Stress: Not on file  Relationships  . Social connections:    Talks on phone: Not on file    Gets together: Not on file    Attends religious service: Not on file    Active member of club or organization: Not on file    Attends meetings of clubs or organizations: Not on file    Relationship status: Not on file  . Intimate partner violence:    Fear of current or ex partner: Not on file    Emotionally abused: Not on file    Physically abused: Not on file    Forced sexual activity: Not on file  Other Topics Concern  . Not on file  Social History Narrative  . Not on file   The PMH, PSH, Social History, Family History, Medications, and allergies have been reviewed in [facility], and have been updated if relevant.   Review of Systems  Constitutional: Negative.   HENT: Negative.   Respiratory: Negative.   Cardiovascular: Negative.     Gastrointestinal: Negative.   Musculoskeletal: Negative.   Psychiatric/Behavioral: Positive for dysphoric mood. Negative for agitation, behavioral problems, confusion, decreased concentration, hallucinations, self-injury, sleep disturbance and suicidal ideas. The patient is nervous/anxious. The patient is not hyperactive.   All other systems reviewed and are negative.      Objective:    BP 116/82   Pulse 88   Temp 98.3 F (36.8 C) (Oral)   Ht 5\' 5"  (1.651 m)   Wt 178 lb 9.6 oz (81 kg)   SpO2 97%   BMI 29.72 kg/m    Physical Exam   General:  Well-developed,well-nourished,in no acute distress; alert,appropriate and cooperative throughout examination Head:  normocephalic and atraumatic.   Eyes:  vision grossly intact, PERRL Ears:  R ear normal and L ear normal externally, TMs clear bilaterally Nose:  no external deformity.   Mouth:  good dentition.   Neck:  No deformities, masses, or tenderness noted. Breasts:  No mass, nodules, thickening, tenderness, bulging, retraction, inflamation, nipple discharge or skin changes noted.   Lungs:  Normal respiratory  effort, chest expands symmetrically. Lungs are clear to auscultation, no crackles or wheezes. Heart:  Normal rate and regular rhythm. S1 and S2 normal without gallop, murmur, click, rub or other extra sounds. Abdomen:  Bowel sounds positive,abdomen soft and non-tender without masses, organomegaly or hernias noted. Msk:  No deformity or scoliosis noted of thoracic or lumbar spine.   Extremities:  No clubbing, cyanosis, edema, or deformity noted with normal full range of motion of all joints.   Neurologic:  alert & oriented X3 and gait normal.   Skin:  Intact without suspicious lesions or rashes Psych:  Cognition and judgment appear intact. Alert and cooperative with normal attention span and concentration. No apparent delusions, illusions, hallucinations       Assessment & Plan:   Essential hypertension  Depression with  anxiety  Palpitations No follow-ups on file.

## 2018-09-20 ENCOUNTER — Encounter: Payer: Self-pay | Admitting: Family Medicine

## 2018-09-20 ENCOUNTER — Ambulatory Visit: Payer: Self-pay | Admitting: Family Medicine

## 2018-09-20 VITALS — BP 116/82 | HR 88 | Temp 98.3°F | Ht 65.0 in | Wt 178.6 lb

## 2018-09-20 DIAGNOSIS — I10 Essential (primary) hypertension: Secondary | ICD-10-CM

## 2018-09-20 DIAGNOSIS — F418 Other specified anxiety disorders: Secondary | ICD-10-CM

## 2018-09-20 DIAGNOSIS — R002 Palpitations: Secondary | ICD-10-CM

## 2018-09-20 NOTE — Assessment & Plan Note (Signed)
Improved controlled with restarting HCZT 12. 5 mg daily. No changes made to current rxs.

## 2018-09-20 NOTE — Patient Instructions (Signed)
Great to see you. I have sent flomax and some pain medication.  If she is still having symptoms, she needs to needs to be see me ASAP.

## 2018-09-20 NOTE — Assessment & Plan Note (Signed)
Improved already already after two week with lexapro..Call or return to clinic prn if these symptoms worsen or fail to improve as anticipated. The patient indicates understanding of these issues and agrees with the plan.

## 2018-12-28 ENCOUNTER — Other Ambulatory Visit: Payer: Self-pay | Admitting: Family Medicine

## 2019-01-01 ENCOUNTER — Other Ambulatory Visit: Payer: Self-pay | Admitting: Family Medicine

## 2019-01-21 ENCOUNTER — Other Ambulatory Visit: Payer: Self-pay | Admitting: Family Medicine

## 2019-06-30 ENCOUNTER — Other Ambulatory Visit: Payer: Self-pay

## 2019-06-30 MED ORDER — HYDROCHLOROTHIAZIDE 12.5 MG PO CAPS: ORAL_CAPSULE | ORAL | 0 refills | Status: DC

## 2019-06-30 NOTE — Telephone Encounter (Signed)
Last OV 09/20/18 Last fill 12/29/18  #90/1

## 2019-07-06 ENCOUNTER — Other Ambulatory Visit: Payer: Self-pay

## 2019-07-06 MED ORDER — ESCITALOPRAM OXALATE 20 MG PO TABS: 20.0000 mg | ORAL_TABLET | Freq: Every day | ORAL | 0 refills | Status: DC

## 2019-07-06 NOTE — Telephone Encounter (Signed)
Last OV 09/20/18 Last fill 01/02/19  #90/1 Patient need a follow up visit before any other refills.

## 2019-07-22 ENCOUNTER — Other Ambulatory Visit: Payer: Self-pay | Admitting: Family Medicine

## 2019-09-28 ENCOUNTER — Other Ambulatory Visit: Payer: Self-pay

## 2019-09-28 MED ORDER — HYDROCHLOROTHIAZIDE 12.5 MG PO CAPS: ORAL_CAPSULE | ORAL | 0 refills | Status: DC

## 2019-12-22 ENCOUNTER — Other Ambulatory Visit: Payer: Self-pay | Admitting: Family Medicine

## 2019-12-27 ENCOUNTER — Telehealth: Payer: Self-pay

## 2019-12-27 NOTE — Telephone Encounter (Signed)
Plz call pt to sched an appt per Dr. Elease Etienne dmf

## 2019-12-27 NOTE — Telephone Encounter (Signed)
LVM to call back to schedule appointment.

## 2020-01-25 ENCOUNTER — Other Ambulatory Visit: Payer: Self-pay | Admitting: Family Medicine

## 2020-02-05 ENCOUNTER — Other Ambulatory Visit: Payer: Self-pay | Admitting: Family Medicine

## 2020-02-28 ENCOUNTER — Other Ambulatory Visit: Payer: Self-pay | Admitting: Family Medicine

## 2020-02-28 MED ORDER — HYDROCHLOROTHIAZIDE 12.5 MG PO CAPS: 12.5000 mg | ORAL_CAPSULE | Freq: Every day | ORAL | 0 refills | Status: DC

## 2020-03-23 ENCOUNTER — Other Ambulatory Visit: Payer: Self-pay | Admitting: Family Medicine

## 2020-03-29 ENCOUNTER — Encounter: Payer: Self-pay | Admitting: Family Medicine

## 2020-03-29 ENCOUNTER — Ambulatory Visit (INDEPENDENT_AMBULATORY_CARE_PROVIDER_SITE_OTHER): Payer: Self-pay | Admitting: Family Medicine

## 2020-03-29 ENCOUNTER — Ambulatory Visit (INDEPENDENT_AMBULATORY_CARE_PROVIDER_SITE_OTHER)
Admission: RE | Admit: 2020-03-29 | Discharge: 2020-03-29 | Disposition: A | Discharge status: Home / Self Care | Payer: Self-pay | Source: Ambulatory Visit | Attending: Family Medicine | Admitting: Family Medicine

## 2020-03-29 ENCOUNTER — Other Ambulatory Visit: Payer: Self-pay

## 2020-03-29 VITALS — BP 128/84 | HR 80

## 2020-03-29 DIAGNOSIS — R05 Cough: Secondary | ICD-10-CM

## 2020-03-29 DIAGNOSIS — R059 Cough, unspecified: Secondary | ICD-10-CM

## 2020-03-29 DIAGNOSIS — K219 Gastro-esophageal reflux disease without esophagitis: Secondary | ICD-10-CM

## 2020-03-29 DIAGNOSIS — R062 Wheezing: Secondary | ICD-10-CM

## 2020-03-29 DIAGNOSIS — Z7689 Persons encountering health services in other specified circumstances: Secondary | ICD-10-CM

## 2020-03-29 DIAGNOSIS — R7303 Prediabetes: Secondary | ICD-10-CM

## 2020-03-29 DIAGNOSIS — I10 Essential (primary) hypertension: Secondary | ICD-10-CM

## 2020-03-29 DIAGNOSIS — F418 Other specified anxiety disorders: Secondary | ICD-10-CM

## 2020-03-29 DIAGNOSIS — Z289 Immunization not carried out for unspecified reason: Secondary | ICD-10-CM

## 2020-03-29 DIAGNOSIS — N951 Menopausal and female climacteric states: Secondary | ICD-10-CM

## 2020-03-29 DIAGNOSIS — R0982 Postnasal drip: Secondary | ICD-10-CM

## 2020-03-29 LAB — COMPREHENSIVE METABOLIC PANEL
ALT: 26 U/L (ref 0–35)
AST: 21 U/L (ref 0–37)
Albumin: 4.5 g/dL (ref 3.5–5.2)
Alkaline Phosphatase: 80 U/L (ref 39–117)
BUN: 17 mg/dL (ref 6–23)
CO2: 31 mEq/L (ref 19–32)
Calcium: 10.4 mg/dL (ref 8.4–10.5)
Chloride: 105 mEq/L (ref 96–112)
Creatinine, Ser: 0.82 mg/dL (ref 0.40–1.20)
GFR: 72.35 mL/min (ref >60.00)
Glucose, Bld: 76 mg/dL (ref 70–99)
Potassium: 4.2 mEq/L (ref 3.5–5.1)
Sodium: 140 mEq/L (ref 135–145)
Total Bilirubin: 0.3 mg/dL (ref 0.2–1.2)
Total Protein: 7.7 g/dL (ref 6.0–8.3)

## 2020-03-29 LAB — CBC WITH DIFFERENTIAL/PLATELET
Basophils Absolute: 0 10*3/uL (ref 0.0–0.1)
Basophils Relative: 0.5 % (ref 0.0–3.0)
Eosinophils Absolute: 0.2 10*3/uL (ref 0.0–0.7)
Eosinophils Relative: 2 % (ref 0.0–5.0)
HCT: 42 % (ref 36.0–46.0)
Hemoglobin: 14.4 g/dL (ref 12.0–15.0)
Lymphocytes Relative: 30.2 % (ref 12.0–46.0)
Lymphs Abs: 2.5 10*3/uL (ref 0.7–4.0)
MCHC: 34.2 g/dL (ref 30.0–36.0)
MCV: 91.4 fl (ref 78.0–100.0)
Monocytes Absolute: 0.5 10*3/uL (ref 0.1–1.0)
Monocytes Relative: 6.1 % (ref 3.0–12.0)
Neutro Abs: 5.1 10*3/uL (ref 1.4–7.7)
Neutrophils Relative %: 61.2 % (ref 43.0–77.0)
Platelets: 389 10*3/uL (ref 150.0–400.0)
RBC: 4.6 Mil/uL (ref 3.87–5.11)
RDW: 12.7 % (ref 11.5–15.5)
WBC: 8.4 10*3/uL (ref 4.0–10.5)

## 2020-03-29 LAB — HEMOGLOBIN A1C: Hgb A1c MFr Bld: 6.1 % (ref 4.6–6.5)

## 2020-03-29 MED ORDER — OMEPRAZOLE 20 MG PO CPDR: 20.0000 mg | DELAYED_RELEASE_CAPSULE | Freq: Every day | ORAL | 1 refills | Status: DC

## 2020-03-29 MED ORDER — HYDROCHLOROTHIAZIDE 12.5 MG PO CAPS: 12.5000 mg | ORAL_CAPSULE | Freq: Every day | ORAL | 3 refills | Status: DC

## 2020-03-29 MED ORDER — FLUTICASONE PROPIONATE 50 MCG/ACT NA SUSP: 2.0000 | Freq: Every day | NASAL | 6 refills | Status: DC

## 2020-03-29 MED ORDER — ALBUTEROL SULFATE HFA 108 (90 BASE) MCG/ACT IN AERS: 2.0000 | INHALATION_SPRAY | RESPIRATORY_TRACT | 1 refills | Status: DC | PRN

## 2020-03-29 NOTE — Progress Notes (Signed)
Subjective:    Patient ID: Judy Huang, female    DOB: 1965-11-11, 54 y.o.   MRN: [id number]  HPI Chief Complaint  Patient presents with  . New Patient (Initial Visit)    for pt of Dr Malon Kindle.  Pt c/o chest congestion and cough from having covid 06/2019   She recently lost her job. 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 Lives with her daughter who is 6.    Last CPE- 09/06/2017 Mammo- 09/19/2018 Pap- not sexually active, has not felt the need for one, will consider at follow up Colonoscopy- 01/22/2017 Tdap- 09/06/2017 Flu- not usually Covid 19 vaccine- has not been vaccinated, does not plan on it Eye- several years Dental- every 2 years Exercise- not regular  Mother with PAD. Smoker. Father with clotting disorder. Protein S deficiency.   Covid diagnosed in Dec. Has had chest congestion since last summer. Always gets a summer cold. Has PND, no drainage. Some nasal congestion in am. Coughs all day and night. Has taken mucinex, cough syrups. Has used flonase on and off for a year. Claritin off and on, not with flonase. Wheezing, chest tightness, no SOB. No weight loss, constant day/ night sweats.   Menopausal- 1997 partial hysterectomy. Hot flashes. No migraine history.   Anxiety/ depression- was on escitalopram, didn't feel like it worked. Does not want to be on mediation.    Review of Systems    per HPI Objective:   Physical Exam Physical Exam  Constitutional: Oriented to person, place, and time. Appears well-developed and well-nourished.  HENT:  Head: Normocephalic and atraumatic.  Eyes: Conjunctivae are normal.  Neck: Normal range of motion. Neck supple.  Cardiovascular: Normal rate, regular rhythm and normal heart sounds.   Pulmonary/Chest: Effort normal and breath sounds normal.  Musculoskeletal: No lower extremity edema.   Neurological: Alert and oriented to person, place, and time.  Skin: Skin is warm and dry.    Psychiatric: Normal mood and affect. Behavior is normal. Judgment and thought content normal.  Vitals reviewed.    BP 128/84 (BP Location: Left Arm, Patient Position: Sitting, Cuff Size: Normal)   Pulse 80   SpO2 95%   Wt Readings from Last 3 Encounters:  09/20/18 178 lb 9.6 oz (81 kg)  09/06/18 180 lb 3.2 oz (81.7 kg)  08/18/18 181 lb (82.1 kg)   GAD 7 : Generalized Anxiety Score 03/29/2020 09/20/2018 09/06/2018  Nervous, Anxious, on Edge 3 3 3   Control/stop worrying 3 3 3   Worry too much - different things 3 3 3   Trouble relaxing 3 0 3  Restless 1 0 3  Easily annoyed or irritable 3 1 3   Afraid - awful might happen 3 3 1   Total GAD 7 Score 19 13 19   Anxiety Difficulty - - Very difficult     Depression screen [facility] 2/9 03/29/2020 09/20/2018 09/06/2018 09/06/2017 05/06/2015  Decreased Interest 2 1 3  0 1  Down, Depressed, Hopeless 3 1 1  0 1  PHQ - 2 Score 5 2 4  0 2  Altered sleeping 3 3 3  - 1  Tired, decreased energy 3 3 3  - 1  Change in appetite 3 2 3  - 1  Feeling bad or failure about yourself  0 1 1 - 0  Trouble concentrating 0 2 3 - 1  Moving slowly or fidgety/restless 0 3 3 - 0  Suicidal thoughts 0 0 0 - 0  PHQ-9 Score 14 16 20  - 6  Difficult  doing work/chores Not difficult at all - Very difficult - Somewhat difficult        Assessment & Plan:  1. Encounter to establish care - reviewed EMR, discussed health maintenance items (mammo, pap, Covid vaccine)  2. Wheeze - albuterol (VENTOLIN HFA) 108 (90 Base) MCG/ACT inhaler; Inhale 2 puffs into the lungs every 4 (four) hours as needed for wheezing or shortness of breath (cough, shortness of breath or wheezing.).  Dispense: 6.7 g; Refill: 1 - DG Chest 2 View; Future - CBC with Differential  3. Cough - chronic in nature, lungs clear today, will treat allergic rhinitis, GERD - follow up in 8 weeks - albuterol (VENTOLIN HFA) 108 (90 Base) MCG/ACT inhaler; Inhale 2 puffs into the lungs every 4 (four) hours as needed for  wheezing or shortness of breath (cough, shortness of breath or wheezing.).  Dispense: 6.7 g; Refill: 1 - DG Chest 2 View; Future - CBC with Differential  4. Gastroesophageal reflux disease, unspecified whether esophagitis present - omeprazole (PRILOSEC) 20 MG capsule; Take 1 capsule (20 mg total) by mouth daily.  Dispense: 90 capsule; Refill: 1  5. Essential hypertension - hydrochlorothiazide (MICROZIDE) 12.5 MG capsule; Take 1 capsule (12.5 mg total) by mouth daily.  Dispense: 90 capsule; Refill: 3 - CBC with Differential - Comprehensive metabolic panel  6. Menopausal symptoms - not a candidate for HRT due to family history of factor s deficiency, not currently interested in repeat depression/anxiety, will address at future visit  7. Post-nasal drainage -She was advised to add daily cetirizine - fluticasone (FLONASE) 50 MCG/ACT nasal spray; Place 2 sprays into both nostrils daily.  Dispense: 16 g; Refill: 6  8. Prediabetes - Hemoglobin A1c  9. Depression with anxiety -Feels like her current situation is contributing to her mood.  Does not wish to go on any medication at this time.  10. COVID-19 virus vaccination not done -Discussed patient's concerns and advised her to get vaccine  This visit occurred during the SARS-CoV-2 public health emergency.  Safety protocols were in place, including screening questions prior to the visit, additional usage of staff PPE, and extensive cleaning of exam room while observing appropriate contact time as indicated for disinfecting solutions.      Clarene Reamer, FNP-BC  [facility], [place] [facility]  03/29/2020 4:48 PM

## 2020-03-29 NOTE — Patient Instructions (Signed)
Good to see you today  Please schedule your complete physical for 2 months  Get labs and chest xray today  I have sent in your HCTZ, nose spray, inhaler, stomach acid reducer to your pharmacy.   Stop famotidine  Add over the counter cetirizine daily- in morning, use the nasal spray at bedtime- both for at least 2 weeks  Inhaler is for cough/ wheeze as needed

## 2020-04-25 ENCOUNTER — Encounter: Payer: Self-pay | Admitting: Family Medicine

## 2020-04-27 ENCOUNTER — Telehealth: Payer: Self-pay | Admitting: Family Medicine

## 2020-04-29 ENCOUNTER — Ambulatory Visit: Payer: Self-pay | Admitting: Family Medicine

## 2020-04-29 ENCOUNTER — Encounter: Payer: Self-pay | Admitting: Family Medicine

## 2020-04-29 ENCOUNTER — Other Ambulatory Visit: Payer: Self-pay

## 2020-04-29 VITALS — BP 136/78 | HR 88 | Temp 97.3°F | Ht 65.0 in | Wt 177.0 lb

## 2020-04-29 DIAGNOSIS — R300 Dysuria: Secondary | ICD-10-CM

## 2020-04-29 LAB — POC URINALSYSI DIPSTICK (AUTOMATED)
Bilirubin, UA: NEGATIVE
Blood, UA: NEGATIVE
Glucose, UA: NEGATIVE
Ketones, UA: NEGATIVE
Leukocytes, UA: NEGATIVE
Nitrite, UA: NEGATIVE
Protein, UA: NEGATIVE
Spec Grav, UA: 1.02 (ref 1.010–1.025)
Urobilinogen, UA: 0.2 E.U./dL
pH, UA: 6 (ref 5.0–8.0)

## 2020-04-29 MED ORDER — SULFAMETHOXAZOLE-TRIMETHOPRIM 800-160 MG PO TABS: 1.0000 | ORAL_TABLET | Freq: Two times a day (BID) | ORAL | 0 refills | Status: DC

## 2020-04-29 NOTE — Assessment & Plan Note (Signed)
Symptoms are consistent with cystitis but since she was tx with macrobid already ua is neg  Symptoms have not improved much-so culture was sent and abx changed to bactrim ds Disc imp of good water intake  Will call with culture result  inst to alert [name] if symptoms worsen /or new ones  Handout given

## 2020-04-29 NOTE — Progress Notes (Signed)
Subjective:    Patient ID: Judy Huang, female    DOB: 1965/08/23, 54 y.o.   MRN: [id number] This visit occurred during the SARS-CoV-2 public health emergency.  Safety protocols were in place, including screening questions prior to the visit, additional usage of staff PPE, and extensive cleaning of exam room while observing appropriate contact time as indicated for disinfecting solutions.    HPI Pt presents with urinary symptoms   She noticed strong urine with odor on Thursday  Then felt pins/needles feeling when she urinated  Then burning   Friday - hurt so bad she was in tears  Pressure over bladder (had to lay flat on her back)  Urgency and some incontinence   No visible blood in urine  Some back pressure but not pain  No flank pain   Had some nausea  No fever    She did a virtual visit and started abx on Friday  Now taking macrobid and pyridium  That took the edge off but still very uncomfortable   Does not get urine infections often (over 10 years)   She drinks 6 glasses of water a day , 1 coffee in am and few green tea  No spicy food  No citrus products (avoiding juices)   No sodas  stevia- in coffee in am     UA: Results for orders placed or performed in visit on 04/29/20  POCT Urinalysis Dipstick (Automated)  Result Value Ref Range   Color, UA Yellow    Clarity, UA Clear    Glucose, UA Negative Negative   Bilirubin, UA Negative    Ketones, UA Negative    Spec Grav, UA 1.020 1.010 - 1.025   Blood, UA Negative    pH, UA 6.0 5.0 - 8.0   Protein, UA Negative Negative   Urobilinogen, UA 0.2 0.2 or 1.0 E.U./dL   Nitrite, UA Negative    Leukocytes, UA Negative Negative    Patient Active Problem List   Diagnosis Date Noted  . Dysuria 04/29/2020  . Palpitations 09/06/2018  . Essential hypertension 09/10/2017  . HLD (hyperlipidemia) 09/06/2017  . Polyp of sigmoid colon   . Menopausal symptoms 09/10/2016  . History of nonmelanoma skin cancer  10/16/2015  . Depression with anxiety 05/06/2015  . Constipation 11/23/2013   Past Medical History:  Diagnosis Date  . Arthritis   . Chicken pox   . Depression   . GERD (gastroesophageal reflux disease)   . High blood pressure   . High cholesterol   . History of gastroesophageal reflux (GERD)   . History of headache   . History of migraine   . History of UTI   . Polyp of colon   . PONV (postoperative nausea and vomiting)    Past Surgical History:  Procedure Laterality Date  . BREAST REDUCTION SURGERY  2000  . CESAREAN SECTION  1990  . COLONOSCOPY WITH PROPOFOL N/A 01/22/2017   Procedure: COLONOSCOPY WITH PROPOFOL;  Surgeon: [name], MD;  Location: [facility];  Service: Endoscopy;  Laterality: N/A;  . PARTIAL HYSTERECTOMY  1997  . POLYPECTOMY  01/22/2017   Procedure: POLYPECTOMY;  Surgeon: [name], MD;  Location: [facility];  Service: Endoscopy;;  . REDUCTION MAMMAPLASTY Bilateral 2000   BILAT   Social History   Tobacco Use  . Smoking status: Former Smoker    Start date: 07/26/2013    Quit date: 2016    Years since quitting: 5.7  . Smokeless tobacco: Never Used  Substance Use Topics  . Alcohol use: No    Comment: rare  . Drug use: No   Family History  Problem Relation Age of Onset  . Arthritis Mother   . Arthritis Father   . Hyperlipidemia Father   . Stroke Father   . High blood pressure Father   . Diabetes Father   . Arthritis Maternal Grandfather   . Cancer Maternal Grandfather   . Early death Maternal Grandfather   . Arthritis Maternal Grandmother   . Stroke Maternal Grandmother   . Birth defects Maternal Grandmother   . High Cholesterol Maternal Grandmother   . Cancer Paternal Grandmother   . Alcohol abuse Paternal Grandmother   . Heart attack Paternal Grandmother   . Hearing loss Paternal Grandfather   . Breast cancer Neg Hx    Allergies  Allergen Reactions  . Other     Dissolvable sutures cause "reaction"  . Percocet  [Oxycodone-Acetaminophen] Nausea Only   Current Outpatient Medications on File Prior to Visit  Medication Sig Dispense Refill  . albuterol (VENTOLIN HFA) 108 (90 Base) MCG/ACT inhaler Inhale 2 puffs into the lungs every 4 (four) hours as needed for wheezing or shortness of breath (cough, shortness of breath or wheezing.). 6.7 g 1  . BABY ASPIRIN PO Take by mouth.    . fluticasone (FLONASE) 50 MCG/ACT nasal spray Place 2 sprays into both nostrils daily. 16 g 6  . hydrochlorothiazide (MICROZIDE) 12.5 MG capsule Take 1 capsule (12.5 mg total) by mouth daily. 90 capsule 3  . Multiple Vitamin (MULTIVITAMIN) capsule Take 1 capsule by mouth daily.    . nitrofurantoin, macrocrystal-monohydrate, (MACROBID) 100 MG capsule Take 100 mg by mouth 2 (two) times daily.    [name] omeprazole (PRILOSEC) 20 MG capsule Take 1 capsule (20 mg total) by mouth daily. 90 capsule 1  . phenazopyridine (PYRIDIUM) 200 MG tablet Take 200 mg by mouth 3 (three) times daily as needed.    . Probiotic Product (PROBIOTIC DAILY PO) Take by mouth.     No current facility-administered medications on file prior to visit.    Review of Systems  Constitutional: Negative for activity change, appetite change, fatigue, fever and unexpected weight change.  HENT: Negative for congestion, ear pain, rhinorrhea, sinus pressure and sore throat.   Eyes: Negative for pain, redness and visual disturbance.  Respiratory: Negative for cough, shortness of breath and wheezing.   Cardiovascular: Negative for chest pain and palpitations.  Gastrointestinal: Negative for abdominal pain, blood in stool, constipation and diarrhea.       Nausea initially  Better now   Endocrine: Negative for polydipsia and polyuria.  Genitourinary: Positive for dysuria, frequency and urgency. Negative for decreased urine volume, flank pain, hematuria, vaginal discharge and vaginal pain.  Musculoskeletal: Negative for arthralgias, back pain and myalgias.  Skin: Negative for  pallor and rash.  Allergic/Immunologic: Negative for environmental allergies.  Neurological: Negative for dizziness, syncope and headaches.  Hematological: Negative for adenopathy. Does not bruise/bleed easily.  Psychiatric/Behavioral: Negative for decreased concentration and dysphoric mood. The patient is not nervous/anxious.        Objective:   Physical Exam Constitutional:      General: She is not in acute distress.    Appearance: Normal appearance. She is well-developed and normal weight. She is not ill-appearing.  HENT:     Head: Normocephalic and atraumatic.  Eyes:     General: No scleral icterus.    Conjunctiva/sclera: Conjunctivae normal.     Pupils: Pupils are equal,  round, and reactive to light.  Cardiovascular:     Rate and Rhythm: Normal rate and regular rhythm.     Heart sounds: Normal heart sounds.  Pulmonary:     Effort: Pulmonary effort is normal. No respiratory distress.     Breath sounds: Normal breath sounds. No wheezing.  Abdominal:     General: Bowel sounds are normal. There is no distension.     Palpations: Abdomen is soft.     Tenderness: There is abdominal tenderness. There is no rebound.     Comments: No cva tenderness  Mild suprapubic tenderness (without fullness or distension)  Musculoskeletal:     Cervical back: Normal range of motion and neck supple. No rigidity or tenderness.  Lymphadenopathy:     Cervical: No cervical adenopathy.  Skin:    Findings: No rash.  Neurological:     Mental Status: She is alert.  Psychiatric:        Mood and Affect: Mood normal.           Assessment & Plan:   Problem List Items Addressed This Visit      Other   Dysuria - Primary    Symptoms are consistent with cystitis but since she was tx with macrobid already ua is neg  Symptoms have not improved much-so culture was sent and abx changed to bactrim ds Disc imp of good water intake  Will call with culture result  inst to alert [name] if symptoms worsen  /or new ones  Handout given      Relevant Orders   POCT Urinalysis Dipstick (Automated) (Completed)   Urine Culture

## 2020-04-29 NOTE — Patient Instructions (Addendum)
Drink lots of water (keep urine light in color)   Switch antibiotic to bactrim ds (generic) as directed   We will call when the culture returns   If something changes in the meantime let us know    Urinary Tract Infection, Adult  A urinary tract infection (UTI) is an infection of any part of the urinary tract. The urinary tract includes the kidneys, ureters, bladder, and urethra. These organs make, store, and get rid of urine in the body. Your health care provider may use other names to describe the infection. An upper UTI affects the ureters and kidneys (pyelonephritis). A lower UTI affects the bladder (cystitis) and urethra (urethritis). What are the causes? Most urinary tract infections are caused by bacteria in your genital area, around the entrance to your urinary tract (urethra). These bacteria grow and cause inflammation of your urinary tract. What increases the risk? You are more likely to develop this condition if:  You have a urinary catheter that stays in place (indwelling).  You are not able to control when you urinate or have a bowel movement (you have incontinence).  You are female and you: ? Use a spermicide or diaphragm for birth control. ? Have low estrogen levels. ? Are pregnant.  You have certain genes that increase your risk (genetics).  You are sexually active.  You take antibiotic medicines.  You have a condition that causes your flow of urine to slow down, such as: ? An enlarged prostate, if you are female. ? Blockage in your urethra (stricture). ? A kidney stone. ? A nerve condition that affects your bladder control (neurogenic bladder). ? Not getting enough to drink, or not urinating often.  You have certain medical conditions, such as: ? Diabetes. ? A weak disease-fighting system (immunesystem). ? Sickle cell disease. ? Gout. ? Spinal cord injury. What are the signs or symptoms? Symptoms of this condition include:  Needing to urinate right away  (urgently).  Frequent urination or passing small amounts of urine frequently.  Pain or burning with urination.  Blood in the urine.  Urine that smells bad or unusual.  Trouble urinating.  Cloudy urine.  Vaginal discharge, if you are female.  Pain in the abdomen or the lower back. You may also have:  Vomiting or a decreased appetite.  Confusion.  Irritability or tiredness.  A fever.  Diarrhea. The first symptom in older adults may be confusion. In some cases, they may not have any symptoms until the infection has worsened. How is this diagnosed? This condition is diagnosed based on your medical history and a physical exam. You may also have other tests, including:  Urine tests.  Blood tests.  Tests for sexually transmitted infections (STIs). If you have had more than one UTI, a cystoscopy or imaging studies may be done to determine the cause of the infections. How is this treated? Treatment for this condition includes:  Antibiotic medicine.  Over-the-counter medicines to treat discomfort.  Drinking enough water to stay hydrated. If you have frequent infections or have other conditions such as a kidney stone, you may need to see a health care provider who specializes in the urinary tract (urologist). In rare cases, urinary tract infections can cause sepsis. Sepsis is a life-threatening condition that occurs when the body responds to an infection. Sepsis is treated in the hospital with IV antibiotics, fluids, and other medicines. Follow these instructions at home:  Medicines  Take over-the-counter and prescription medicines only as told by your health care provider.  If you were prescribed an antibiotic medicine, take it as told by your health care provider. Do not stop using the antibiotic even if you start to feel better. General instructions  Make sure you: ? Empty your bladder often and completely. Do not hold urine for long periods of time. ? Empty your  bladder after sex. ? Wipe from front to back after a bowel movement if you are female. Use each tissue one time when you wipe.  Drink enough fluid to keep your urine pale yellow.  Keep all follow-up visits as told by your health care provider. This is important. Contact a health care provider if:  Your symptoms do not get better after 1-2 days.  Your symptoms go away and then return. Get help right away if you have:  Severe pain in your back or your lower abdomen.  A fever.  Nausea or vomiting. Summary  A urinary tract infection (UTI) is an infection of any part of the urinary tract, which includes the kidneys, ureters, bladder, and urethra.  Most urinary tract infections are caused by bacteria in your genital area, around the entrance to your urinary tract (urethra).  Treatment for this condition often includes antibiotic medicines.  If you were prescribed an antibiotic medicine, take it as told by your health care provider. Do not stop using the antibiotic even if you start to feel better.  Keep all follow-up visits as told by your health care provider. This is important. This information is not intended to replace advice given to you by your health care provider. Make sure you discuss any questions you have with your health care provider. Document Revised: 06/23/2018 Document Reviewed: 01/13/2018 Elsevier Patient Education  2020 [employer].

## 2020-04-30 LAB — URINE CULTURE
MICRO NUMBER:: 11055844
SPECIMEN QUALITY:: ADEQUATE

## 2020-05-19 ENCOUNTER — Other Ambulatory Visit: Payer: Self-pay | Admitting: Family Medicine

## 2020-05-19 DIAGNOSIS — R7303 Prediabetes: Secondary | ICD-10-CM

## 2020-05-19 DIAGNOSIS — K5900 Constipation, unspecified: Secondary | ICD-10-CM

## 2020-05-19 DIAGNOSIS — Z6831 Body mass index (BMI) 31.0-31.9, adult: Secondary | ICD-10-CM

## 2020-05-19 DIAGNOSIS — E6609 Other obesity due to excess calories: Secondary | ICD-10-CM

## 2020-05-19 DIAGNOSIS — I10 Essential (primary) hypertension: Secondary | ICD-10-CM

## 2020-05-24 ENCOUNTER — Other Ambulatory Visit: Payer: Self-pay

## 2020-05-24 ENCOUNTER — Other Ambulatory Visit (INDEPENDENT_AMBULATORY_CARE_PROVIDER_SITE_OTHER): Payer: Self-pay

## 2020-05-24 DIAGNOSIS — I10 Essential (primary) hypertension: Secondary | ICD-10-CM

## 2020-05-24 DIAGNOSIS — K5900 Constipation, unspecified: Secondary | ICD-10-CM

## 2020-05-24 DIAGNOSIS — R7303 Prediabetes: Secondary | ICD-10-CM

## 2020-05-24 DIAGNOSIS — Z6831 Body mass index (BMI) 31.0-31.9, adult: Secondary | ICD-10-CM

## 2020-05-24 DIAGNOSIS — E6609 Other obesity due to excess calories: Secondary | ICD-10-CM

## 2020-05-24 LAB — LIPID PANEL
Cholesterol: 226 mg/dL — ABNORMAL HIGH (ref 0–200)
HDL: 41.1 mg/dL (ref >39.00)
LDL Cholesterol: 156 mg/dL — ABNORMAL HIGH (ref 0–99)
NonHDL: 184.63
Total CHOL/HDL Ratio: 5
Triglycerides: 141 mg/dL (ref 0.0–149.0)
VLDL: 28.2 mg/dL (ref 0.0–40.0)

## 2020-05-24 LAB — VITAMIN D 25 HYDROXY (VIT D DEFICIENCY, FRACTURES): VITD: 19.06 ng/mL — ABNORMAL LOW (ref 30.00–100.00)

## 2020-05-24 LAB — TSH: TSH: 2.91 u[IU]/mL (ref 0.35–4.50)

## 2020-05-28 ENCOUNTER — Other Ambulatory Visit: Payer: Self-pay | Admitting: Family Medicine

## 2020-05-31 ENCOUNTER — Encounter: Payer: Self-pay | Admitting: Family Medicine

## 2020-05-31 ENCOUNTER — Other Ambulatory Visit: Payer: Self-pay

## 2020-05-31 ENCOUNTER — Ambulatory Visit: Payer: Self-pay | Admitting: Family Medicine

## 2020-05-31 VITALS — BP 126/74 | HR 95 | Temp 98.6°F | Ht 64.5 in | Wt 174.0 lb

## 2020-05-31 DIAGNOSIS — Z6379 Other stressful life events affecting family and household: Secondary | ICD-10-CM

## 2020-05-31 DIAGNOSIS — E559 Vitamin D deficiency, unspecified: Secondary | ICD-10-CM

## 2020-05-31 DIAGNOSIS — I10 Essential (primary) hypertension: Secondary | ICD-10-CM

## 2020-05-31 DIAGNOSIS — Z8249 Family history of ischemic heart disease and other diseases of the circulatory system: Secondary | ICD-10-CM

## 2020-05-31 DIAGNOSIS — Z0000 Encounter for general adult medical examination without abnormal findings: Secondary | ICD-10-CM

## 2020-05-31 DIAGNOSIS — E785 Hyperlipidemia, unspecified: Secondary | ICD-10-CM

## 2020-05-31 NOTE — Progress Notes (Signed)
Subjective:    Patient ID: Judy Huang, female    DOB: 05-02-1966, 54 y.o.   MRN: [id number]  HPI Chief Complaint  Patient presents with  . Annual Exam    Declines pap   This is a 54 yo female who presents today for annual exam.  Her mother is admitted to in patient Hopsice with gangrene/ PVD, her father is not dealing well/ accepting.   Last CPE-09/06/2017 Mammo-09/19/2018 Pap-declines Colonoscopy-01/22/2017 Tdap-09/06/2017 Flu-declines Covid 19 vaccine-declines Eye-overdue Exercise-active with daily activity, some walking  The 10-year ASCVD risk score Mikey Bussing DC Jr., et al., 2013) is: 9.3%   Values used to calculate the score:     Age: 39 years     Sex: Female     Is Non-Hispanic African American: No     Diabetic: No     Tobacco smoker: Yes     Systolic Blood Pressure: 258 mmHg     Is BP treated: Yes     HDL Cholesterol: 41.1 mg/dL     Total Cholesterol: 226 mg/dL    Review of Systems  Constitutional: Positive for fatigue. Negative for unexpected weight change.  HENT: Negative.   Eyes: Negative.   Respiratory: Negative.   Cardiovascular: Negative.   Gastrointestinal: Negative.   Endocrine: Negative.   Genitourinary: Negative.   Musculoskeletal: Negative.   Skin: Negative.   Allergic/Immunologic: Negative.   Neurological: Negative.   Hematological: Negative.   Psychiatric/Behavioral: Positive for sleep disturbance. The patient is nervous/anxious.        Objective:   Physical Exam    BP 126/74   Pulse 95   Temp 98.6 F (37 C) (Temporal)   Ht 5' 4.5" (1.638 m)   Wt 174 lb (78.9 kg)   SpO2 98%   BMI 29.41 kg/m  Wt Readings from Last 3 Encounters:  05/31/20 174 lb (78.9 kg)  04/29/20 177 lb (80.3 kg)  09/20/18 178 lb 9.6 oz (81 kg)   GAD 7 : Generalized Anxiety Score 05/31/2020 03/29/2020 09/20/2018 09/06/2018  Nervous, Anxious, on Edge 3 3 3 3   Control/stop worrying 3 3 3 3   Worry too much - different things 3 3 3 3   Trouble relaxing 3 3 0 3    Restless 3 1 0 3  Easily annoyed or irritable 3 3 1 3   Afraid - awful might happen 3 3 3 1   Total GAD 7 Score 21 19 13 19   Anxiety Difficulty - - - Very difficult    Depression screen [facility] 2/9 05/31/2020 03/29/2020 09/20/2018 09/06/2018 09/06/2017  Decreased Interest 0 2 1 3  0  Down, Depressed, Hopeless 1 3 1 1  0  PHQ - 2 Score 1 5 2 4  0  Altered sleeping 3 3 3 3  -  Tired, decreased energy 3 3 3 3  -  Change in appetite 1 3 2 3  -  Feeling bad or failure about yourself  0 0 1 1 -  Trouble concentrating 1 0 2 3 -  Moving slowly or fidgety/restless 2 0 3 3 -  Suicidal thoughts 0 0 0 0 -  PHQ-9 Score 11 14 16 20  -  Difficult doing work/chores Somewhat difficult Not difficult at all - Very difficult -       Assessment & Plan:  1. Annual physical exam - Discussed and encouraged healthy lifestyle choices- adequate sleep, regular exercise, stress management and healthy food choices.    2. Vitamin D deficiency -Advised her to take vitamin D3 50,000 IU once a week  3. Essential hypertension -Well-controlled  4. Hyperlipidemia, unspecified hyperlipidemia type -Discussed findings of lipid panel and encouraged weight loss, avoidance of sugary, starchy foods, increase proteins, healthy fats, fruits and vegetables, provided written and verbal information  5. Stress due to illness of family member -Encouraged self-care and discussed stages of grief  6. Family history of thrombosis or embolism -She is interested in screening for factor S deficiency due to parent and sibling with disorder and clotting issues.  I will see how much blood work costs since she does not have insurance and is self-pay and will let her know.  Do not know that there will be much advantage and will convey this to her as she is not in a high risk group i.e. taking OCPs, pregnancy or postpartum  This visit occurred during the SARS-CoV-2 public health emergency.  Safety protocols were in place, including screening questions  prior to the visit, additional usage of staff PPE, and extensive cleaning of exam room while observing appropriate contact time as indicated for disinfecting solutions.    Clarene Reamer, FNP-BC  [facility]  05/31/2020 5:14 PM

## 2020-05-31 NOTE — Patient Instructions (Addendum)
Good to see you today  Please start a weekly high dose vitamin D 50,000 IU once a week ([employer])  There is not one right eating plan for everyone.  It may take trial and error to find what will work for you.  It is important to get adequate protein and fiber with your meals.  It is okay to not eat breakfast or to skip meals if you are not hungry.  Avoid snacking between meals.  Unless you are on a fluid restriction, drink 80 to 90 ounces of water a day.  Suggested resources- www.dietdoctor.com/diabetes/diet www.adaptyourlifeacademy.com-there is a quiz to help you determine how many carbohydrates you should eat a day  www.thefastingmethod.com  Here are some guidelines to help you with meal planning -  Avoid all processed and packaged foods (bread, pasta, crackers, chips, etc) and beverages containing calories.  Avoid added sugars and excessive natural sugars.  Pay attention to how you feel if you consume artificial sweeteners.  Do they make you more hungry or raise your blood sugar?  With every meal and snack, aim to get 20 g of protein (3 ounces of meat, 4 ounces of fish, 3 eggs, protein powder, 1 cup [place] yogurt, 1 cup cottage cheese, etc.)  Increase fiber in the form of non-starchy vegetables.  These help you feel full with very little carbohydrates and are good for gut health.  Nonstarchy vegetables include summer squash, onions, peppers, tomatoes, eggplant, broccoli, cauliflower, cabbage, lettuce, spinach.  Have small amounts of good fats such as avocado, nuts, olive oil, nut butters, olives.  Add a little cheese to your meals to make them tasty.   Try to plan your meals for the week and do some meal preparation when able.  If possible, make lunches for the week ahead of time.  Plan a couple of dinners and make enough so you can have leftovers.  Build in a treat once a week.

## 2020-07-10 ENCOUNTER — Encounter: Payer: Self-pay | Admitting: Family Medicine

## 2020-09-26 ENCOUNTER — Other Ambulatory Visit: Payer: Self-pay

## 2020-09-26 DIAGNOSIS — K219 Gastro-esophageal reflux disease without esophagitis: Secondary | ICD-10-CM

## 2020-09-26 NOTE — Telephone Encounter (Signed)
Pharmacy requests refill on: Omeprazole 20 mg   LAST REFILL: 03/29/2020 (Q-90, R-1) LAST OV: 05/31/2020 NEXT OV: Not Scheduled  PHARMACY: [place], [place]

## 2020-09-29 MED ORDER — OMEPRAZOLE 20 MG PO CPDR: 20.0000 mg | DELAYED_RELEASE_CAPSULE | Freq: Every day | ORAL | 1 refills | Status: DC

## 2020-09-29 NOTE — Telephone Encounter (Signed)
Sent. Thanks.   

## 2020-10-02 ENCOUNTER — Other Ambulatory Visit: Payer: Self-pay

## 2020-10-02 ENCOUNTER — Telehealth (INDEPENDENT_AMBULATORY_CARE_PROVIDER_SITE_OTHER): Payer: Self-pay | Admitting: Family Medicine

## 2020-10-02 ENCOUNTER — Encounter: Payer: Self-pay | Admitting: Family Medicine

## 2020-10-02 VITALS — BP 154/93 | HR 86 | Ht 64.5 in | Wt 174.0 lb

## 2020-10-02 DIAGNOSIS — R053 Chronic cough: Secondary | ICD-10-CM | POA: Insufficient documentation

## 2020-10-02 MED ORDER — MONTELUKAST SODIUM 10 MG PO TABS: 10.0000 mg | ORAL_TABLET | Freq: Every day | ORAL | 11 refills | Status: DC

## 2020-10-02 NOTE — Progress Notes (Signed)
Patient ID: Judy Huang, female    DOB: [birth year], 55 y.o.   MRN: [id number]  This visit was conducted in person.  BP (!) 154/93   Pulse 86   Ht 5' 4.5" (1.638 m)   Wt 174 lb (78.9 kg)   BMI 29.41 kg/m    CC:  Chief Complaint  Patient presents with  . Cough    X 1 year-Negative Covid test 1 week ago-Did have Covid Dec 2020  . Sinus Drainage    Feels like greenish mucus is just sitting in chest-Not coming up    Subjective:   HPI: Judy Huang is a 55 y.o. female presenting on 10/02/2020 for Cough (X 1 year-Negative Covid test 1 week ago-Did have Covid Dec 2020) and Sinus Drainage (Feels like greenish mucus is just sitting in chest-Not coming up)      Relevant past medical, surgical, family and social history reviewed and updated as indicated. Interim medical history since our last visit reviewed. Allergies and medications reviewed and updated. Outpatient Medications Prior to Visit  Medication Sig Dispense Refill  . albuterol (VENTOLIN HFA) 108 (90 Base) MCG/ACT inhaler Inhale 2 puffs into the lungs every 4 (four) hours as needed for wheezing or shortness of breath (cough, shortness of breath or wheezing.). 6.7 g 1  . BABY ASPIRIN PO Take by mouth.    . fluticasone (FLONASE) 50 MCG/ACT nasal spray Place 2 sprays into both nostrils daily. 16 g 6  . hydrochlorothiazide (MICROZIDE) 12.5 MG capsule Take 1 capsule (12.5 mg total) by mouth daily. 90 capsule 3  . Multiple Vitamin (MULTIVITAMIN) capsule Take 1 capsule by mouth daily.    [name] omeprazole (PRILOSEC) 20 MG capsule Take 1 capsule (20 mg total) by mouth daily. 90 capsule 1  . Probiotic Product (PROBIOTIC DAILY PO) Take by mouth.     No facility-administered medications prior to visit.     Per HPI unless specifically indicated in ROS section below Review of Systems  Constitutional: Negative for fatigue and fever.  HENT: Positive for congestion.   Eyes: Negative for pain.  Respiratory: Positive for cough.  Negative for shortness of breath.   Cardiovascular: Negative for chest pain, palpitations and leg swelling.  Gastrointestinal: Negative for abdominal pain.  Genitourinary: Negative for dysuria and vaginal bleeding.  Musculoskeletal: Negative for back pain.  Neurological: Negative for syncope, light-headedness and headaches.  Psychiatric/Behavioral: Negative for dysphoric mood.   Objective:  BP (!) 154/93   Pulse 86   Ht 5' 4.5" (1.638 m)   Wt 174 lb (78.9 kg)   BMI 29.41 kg/m   Wt Readings from Last 3 Encounters:  10/02/20 174 lb (78.9 kg)  05/31/20 174 lb (78.9 kg)  04/29/20 177 lb (80.3 kg)      Physical Exam    Physical Exam Constitutional:      General: The patient is not in acute distress. Pulmonary:     Effort: Pulmonary effort is normal. No respiratory distress.  Neurological:     Mental Status: The patient is alert and oriented to person, place, and time.  Psychiatric:        Mood and Affect: Mood normal.        Behavior: Behavior normal.   Results for orders placed or performed in visit on 05/24/20  VITAMIN D 25 Hydroxy (Vit-D Deficiency, Fractures)  Result Value Ref Range   VITD 19.06 (L) 30.00 - 100.00 ng/mL  TSH  Result Value Ref Range   TSH 2.91 0.35 -  4.50 uIU/mL  Lipid panel  Result Value Ref Range   Cholesterol 226 (H) 0 - 200 mg/dL   Triglycerides 141.0 0.0 - 149.0 mg/dL   HDL 41.10 >39.00 mg/dL   VLDL 28.2 0.0 - 40.0 mg/dL   LDL Cholesterol 156 (H) 0 - 99 mg/dL   Total CHOL/HDL Ratio 5    NonHDL 184.63     This visit occurred during the SARS-CoV-2 public health emergency.  Safety protocols were in place, including screening questions prior to the visit, additional usage of staff PPE, and extensive cleaning of exam room while observing appropriate contact time as indicated for disinfecting solutions.   COVID 19 screen:  No recent travel or known exposure to COVID19 The patient denies respiratory symptoms of COVID 19 at this time. The importance  of social distancing was discussed today.   Assessment and Plan    Problem List Items Addressed This Visit    Chronic cough - Primary    CXR clear. No S/S of infection. No benefit from albuterol.   Allergies most liekly cause as GERD is well controlled on BID prilosec. Will try trial of singulair.  follow up in 3-4 weeks in office.  If no improvement consdier Chest CT and referral to Pulmonary.         Eliezer Lofts, MD

## 2020-10-02 NOTE — Assessment & Plan Note (Signed)
CXR clear. No S/S of infection. No benefit from albuterol.   Allergies most liekly cause as GERD is well controlled on BID prilosec. Will try trial of singulair.  follow up in 3-4 weeks in office.  If no improvement consdier Chest CT and referral to Pulmonary.

## 2020-10-04 ENCOUNTER — Telehealth: Payer: Self-pay

## 2020-10-04 MED ORDER — MONTELUKAST SODIUM 10 MG PO TABS: 10.0000 mg | ORAL_TABLET | Freq: Every day | ORAL | 11 refills | Status: DC

## 2020-10-04 NOTE — Telephone Encounter (Signed)
Patient seen Dr.Bedsole earlier this week and was prescribed singulair but when going to pick it up it was going to be over $100. Wanting to know if it can be sent to [facility] (Address: [address] [place], [place]: [phone])

## 2020-10-04 NOTE — Telephone Encounter (Signed)
Singulair prescription sent to [facility] as requested.  Patient notified via telephone.

## 2020-10-31 ENCOUNTER — Ambulatory Visit: Payer: Self-pay | Admitting: Family Medicine

## 2020-12-20 ENCOUNTER — Other Ambulatory Visit: Payer: Self-pay

## 2020-12-20 DIAGNOSIS — K219 Gastro-esophageal reflux disease without esophagitis: Secondary | ICD-10-CM

## 2020-12-20 MED ORDER — OMEPRAZOLE 20 MG PO CPDR: 20.0000 mg | DELAYED_RELEASE_CAPSULE | Freq: Every day | ORAL | 0 refills | Status: DC

## 2020-12-20 NOTE — Telephone Encounter (Signed)
Patient called and wanted to know if she could have her Omeprazole sent to the [facility] on [address]. Will send to this location since it is cheaper for patient.

## 2020-12-30 ENCOUNTER — Other Ambulatory Visit: Payer: Self-pay

## 2020-12-30 ENCOUNTER — Encounter: Payer: Self-pay | Admitting: Family Medicine

## 2020-12-30 ENCOUNTER — Ambulatory Visit (INDEPENDENT_AMBULATORY_CARE_PROVIDER_SITE_OTHER): Payer: Self-pay | Admitting: Family Medicine

## 2020-12-30 VITALS — BP 126/72 | HR 65 | Temp 97.5°F | Ht 64.5 in | Wt 167.0 lb

## 2020-12-30 DIAGNOSIS — J301 Allergic rhinitis due to pollen: Secondary | ICD-10-CM

## 2020-12-30 DIAGNOSIS — R7303 Prediabetes: Secondary | ICD-10-CM | POA: Insufficient documentation

## 2020-12-30 DIAGNOSIS — E663 Overweight: Secondary | ICD-10-CM | POA: Insufficient documentation

## 2020-12-30 DIAGNOSIS — J309 Allergic rhinitis, unspecified: Secondary | ICD-10-CM | POA: Insufficient documentation

## 2020-12-30 DIAGNOSIS — R7989 Other specified abnormal findings of blood chemistry: Secondary | ICD-10-CM

## 2020-12-30 DIAGNOSIS — I10 Essential (primary) hypertension: Secondary | ICD-10-CM

## 2020-12-30 DIAGNOSIS — E7800 Pure hypercholesterolemia, unspecified: Secondary | ICD-10-CM

## 2020-12-30 LAB — TSH: TSH: 2.99 u[IU]/mL (ref 0.35–4.50)

## 2020-12-30 LAB — T4, FREE: Free T4: 0.8 ng/dL (ref 0.60–1.60)

## 2020-12-30 NOTE — Assessment & Plan Note (Signed)
TSH with the life alert program was 13.4  Some fatigue Our last was 2.9 in November  Will re check this today with FT4 Disc s/s of hypothyroidism

## 2020-12-30 NOTE — Assessment & Plan Note (Signed)
bp in fair control at this time  BP Readings from Last 1 Encounters:  12/30/20 126/72   No changes needed Most recent labs reviewed  Disc lifstyle change with low sodium diet and exercise  Plan to continue hctz 12.5 mg daily  This will help prevent HTN related vasc dz in the future

## 2020-12-30 NOTE — Assessment & Plan Note (Signed)
Cholesterol for the life alert program is better than last one here  HDL of 40- stable  LDL of 112 (down from 156)  Commended  Working on better diet (also wt loss)  Her 10 y ASCVD risk is 9.3% for 69 y  She smoked in past- only socially

## 2020-12-30 NOTE — Progress Notes (Signed)
Subjective:    Patient ID: Judy Huang, female    DOB: 1966-02-14, 55 y.o.   MRN: [id number]  This visit occurred during the SARS-CoV-2 public health emergency.  Safety protocols were in place, including screening questions prior to the visit, additional usage of staff PPE, and extensive cleaning of exam room while observing appropriate contact time as indicated for disinfecting solutions.   HPI 55 yo pt of NP Carlean Purl presents to discuss lifeline screening results and also needs a mammogram order  Wt Readings from Last 3 Encounters:  12/30/20 167 lb (75.8 kg)  10/02/20 174 lb (78.9 kg)  05/31/20 174 lb (78.9 kg)   28.22 kg/m  Used to smoke but no longer (just social)   Pt had recent lifeline screening   Labs: Chol 201 HDL 40 Trig 244 LDL 112  (improved)  TC/HDL is 5.0  Non HDL 161  Last lipids here Lab Results  Component Value Date   CHOL 226 (H) 05/24/2020   HDL 41.10 05/24/2020   LDLCALC 156 (H) 05/24/2020   LDLDIRECT 135.0 09/06/2017   TRIG 141.0 05/24/2020   CHOLHDL 5 05/24/2020  Has been very active and exercises more  Has been eating better   She started octavia diet   The 10-year ASCVD risk score Mikey Bussing DC Jr., et al., 2013) is: 9.3%   Values used to calculate the score:     Age: 55 years     Sex: Female     Is Non-Hispanic African American: No     Diabetic: No     Tobacco smoker: Yes     Systolic Blood Pressure: 56 mmHg     Is BP treated: Yes     HDL Cholesterol: 41.1 mg/dL     Total Cholesterol: 226 mg/dL   Her mother died in 09-06-2022 of PAD (legs)   Heart risk assessment was noted 6% 10 y at lifeline   Glucose was 87  Noted bmi 27   A1c was 5.8 - noting prediabetes   TSH was 13.4 Here  Lab Results  Component Value Date   TSH 2.91 05/24/2020    Vit D level 31 Up from 19   CRP was avg at 1.8   Mammogram 3/20 nl at [facility]  Self breast exam   Pap was 2/18  Colonoscopy 7/18   HTN bp is stable today  No cp or  palpitations or headaches or edema  No side effects to medicines  BP Readings from Last 3 Encounters:  12/30/20 126/72  10/02/20 (!) 154/93  05/31/20 126/74    Taking hctz 12.5 mg daily  Lab Results  Component Value Date   CREATININE 0.82 03/29/2020   BUN 17 03/29/2020   NA 140 03/29/2020   K 4.2 03/29/2020   CL 105 03/29/2020   CO2 31 03/29/2020   Patient Active Problem List   Diagnosis Date Noted   Prediabetes 12/30/2020   Elevated TSH 12/30/2020   Allergic rhinitis 12/30/2020   Overweight (BMI 25.0-29.9) 12/30/2020   Chronic cough 10/02/2020   Vitamin D deficiency 05/31/2020   Palpitations 09/06/2018   Essential hypertension 09/10/2017   HLD (hyperlipidemia) 09/06/2017   Polyp of sigmoid colon    Menopausal symptoms 09/10/2016   History of nonmelanoma skin cancer 10/16/2015   Past Medical History:  Diagnosis Date   Arthritis    Chicken pox    Depression    GERD (gastroesophageal reflux disease)    High blood pressure    High cholesterol  History of gastroesophageal reflux (GERD)    History of headache    History of migraine    History of UTI    Polyp of colon    PONV (postoperative nausea and vomiting)    Past Surgical History:  Procedure Laterality Date   BREAST REDUCTION SURGERY  2000   CESAREAN SECTION  1990   COLONOSCOPY WITH PROPOFOL N/A 01/22/2017   Procedure: COLONOSCOPY WITH PROPOFOL;  Surgeon: [name], MD;  Location: [facility];  Service: Endoscopy;  Laterality: N/A;   PARTIAL HYSTERECTOMY  1997   POLYPECTOMY  01/22/2017   Procedure: POLYPECTOMY;  Surgeon: [name], MD;  Location: [facility];  Service: Endoscopy;;   REDUCTION MAMMAPLASTY Bilateral 2000   BILAT   Social History   Tobacco Use   Smoking status: Former    Pack years: 0.00    Types: Cigarettes    Start date: 07/26/2013    Quit date: 2016    Years since quitting: 6.4   Smokeless tobacco: Never  Substance Use Topics   Alcohol use: No    Comment: rare    Drug use: No   Family History  Problem Relation Age of Onset   Arthritis Mother    Arthritis Father    Hyperlipidemia Father    Stroke Father    High blood pressure Father    Diabetes Father    Arthritis Maternal Grandfather    Cancer Maternal Grandfather    Early death Maternal Grandfather    Arthritis Maternal Grandmother    Stroke Maternal Grandmother    Birth defects Maternal Grandmother    High Cholesterol Maternal Grandmother    Cancer Paternal Grandmother    Alcohol abuse Paternal Grandmother    Heart attack Paternal Grandmother    Hearing loss Paternal Grandfather    Breast cancer Neg Hx    Allergies  Allergen Reactions   Other     Dissolvable sutures cause "reaction"   Percocet [Oxycodone-Acetaminophen] Nausea Only   Current Outpatient Medications on File Prior to Visit  Medication Sig Dispense Refill   albuterol (VENTOLIN HFA) 108 (90 Base) MCG/ACT inhaler Inhale 2 puffs into the lungs every 4 (four) hours as needed for wheezing or shortness of breath (cough, shortness of breath or wheezing.). 6.7 g 1   BABY ASPIRIN PO Take by mouth.     fluticasone (FLONASE) 50 MCG/ACT nasal spray Place 2 sprays into both nostrils daily. 16 g 6   hydrochlorothiazide (MICROZIDE) 12.5 MG capsule Take 1 capsule (12.5 mg total) by mouth daily. 90 capsule 3   Multiple Vitamin (MULTIVITAMIN) capsule Take 1 capsule by mouth daily.     omeprazole (PRILOSEC) 20 MG capsule Take 1 capsule (20 mg total) by mouth daily. 90 capsule 0   Probiotic Product (PROBIOTIC DAILY PO) Take by mouth.     No current facility-administered medications on file prior to visit.    Review of Systems  Constitutional:  Negative for activity change, appetite change, fatigue, fever and unexpected weight change.  HENT:  Negative for congestion, ear pain, rhinorrhea, sinus pressure and sore throat.   Eyes:  Negative for pain, redness and visual disturbance.  Respiratory:  Negative for cough, shortness of breath  and wheezing.   Cardiovascular:  Negative for chest pain and palpitations.  Gastrointestinal:  Negative for abdominal pain, blood in stool, constipation and diarrhea.  Endocrine: Negative for polydipsia and polyuria.  Genitourinary:  Negative for dysuria, frequency and urgency.  Musculoskeletal:  Negative for arthralgias, back pain and myalgias.  Skin:  Negative for pallor and rash.  Allergic/Immunologic: Negative for environmental allergies.  Neurological:  Negative for dizziness, syncope and headaches.  Hematological:  Negative for adenopathy. Does not bruise/bleed easily.  Psychiatric/Behavioral:  Negative for decreased concentration and dysphoric mood. The patient is not nervous/anxious.       Objective:   Physical Exam Constitutional:      General: She is not in acute distress.    Appearance: Normal appearance. She is well-developed.     Comments: overwt  HENT:     Head: Normocephalic and atraumatic.  Eyes:     Conjunctiva/sclera: Conjunctivae normal.     Pupils: Pupils are equal, round, and reactive to light.  Neck:     Thyroid: No thyromegaly.     Vascular: No carotid bruit or JVD.  Cardiovascular:     Rate and Rhythm: Normal rate and regular rhythm.     Heart sounds: Normal heart sounds.    No gallop.  Pulmonary:     Effort: Pulmonary effort is normal. No respiratory distress.     Breath sounds: Normal breath sounds. No wheezing or rales.  Abdominal:     General: Abdomen is flat. There is no abdominal bruit.  Musculoskeletal:     Cervical back: Normal range of motion and neck supple.     Right lower leg: No edema.     Left lower leg: No edema.  Lymphadenopathy:     Cervical: No cervical adenopathy.  Skin:    General: Skin is warm and dry.     Coloration: Skin is not pale.     Findings: No rash.  Neurological:     Mental Status: She is alert.     Cranial Nerves: No cranial nerve deficit.     Coordination: Coordination normal.     Deep Tendon Reflexes: Reflexes  are normal and symmetric. Reflexes normal.  Psychiatric:        Mood and Affect: Mood normal.          Assessment & Plan:   Problem List Items Addressed This Visit       Cardiovascular and Mediastinum   Essential hypertension    bp in fair control at this time  BP Readings from Last 1 Encounters:  12/30/20 126/72  No changes needed Most recent labs reviewed  Disc lifstyle change with low sodium diet and exercise  Plan to continue hctz 12.5 mg daily  This will help prevent HTN related vasc dz in the future          Respiratory   Allergic rhinitis    Taking flonase and still has some rhinorrhea  Adv to start otc antihistamine daily (her choice)         Other   HLD (hyperlipidemia) - Primary    Cholesterol for the life alert program is better than last one here  HDL of 40- stable  LDL of 112 (down from 156)  Commended  Working on better diet (also wt loss)  Her 10 y ASCVD risk is 9.3% for 32 y  She smoked in past- only socially       Prediabetes    a1c was 5.8 with lifeline program-indicating prediabetes disc imp of low glycemic diet and wt loss to prevent DM2  She is about to start a wt loss program       Elevated TSH    TSH with the life alert program was 13.4  Some fatigue Our last was 2.9 in November  Will re check this  today with FT4 Disc s/s of hypothyroidism       Relevant Orders   TSH   T4, free   Overweight (BMI 25.0-29.9)    Discussed how this problem influences overall health and the risks it imposes  Reviewed plan for weight loss with lower calorie diet (via better food choices and also portion control or program like weight watchers) and exercise building up to or more than 30 minutes 5 days per week including some aerobic activity   About to start a program for this

## 2020-12-30 NOTE — Patient Instructions (Addendum)
You should be able to schedule your own mammogram at [facility]  Let us know if you need anything   For allergies  Continue the flonase  Add zyrtec or claritin or allegra (generic store brand)

## 2020-12-30 NOTE — Assessment & Plan Note (Signed)
Discussed how this problem influences overall health and the risks it imposes  Reviewed plan for weight loss with lower calorie diet (via better food choices and also portion control or program like weight watchers) and exercise building up to or more than 30 minutes 5 days per week including some aerobic activity   About to start a program for this

## 2020-12-30 NOTE — Assessment & Plan Note (Signed)
a1c was 5.8 with lifeline program-indicating prediabetes disc imp of low glycemic diet and wt loss to prevent DM2  She is about to start a wt loss program

## 2020-12-30 NOTE — Assessment & Plan Note (Signed)
Taking flonase and still has some rhinorrhea  Adv to start otc antihistamine daily (her choice)

## 2020-12-31 ENCOUNTER — Other Ambulatory Visit: Payer: Self-pay | Admitting: Family Medicine

## 2020-12-31 ENCOUNTER — Telehealth: Payer: Self-pay | Admitting: Family Medicine

## 2020-12-31 DIAGNOSIS — Z1231 Encounter for screening mammogram for malignant neoplasm of breast: Secondary | ICD-10-CM | POA: Insufficient documentation

## 2020-12-31 NOTE — Telephone Encounter (Signed)
Order is in.

## 2020-12-31 NOTE — Telephone Encounter (Signed)
Left VM letting pt know order done and she can call and schedule appt directly

## 2020-12-31 NOTE — Telephone Encounter (Signed)
Judy Huang called in due to she thought she could call [name]  but they told her she needed a referral.

## 2021-01-13 ENCOUNTER — Telehealth: Payer: Self-pay | Admitting: Family Medicine

## 2021-01-13 DIAGNOSIS — N644 Mastodynia: Secondary | ICD-10-CM

## 2021-01-13 NOTE — Telephone Encounter (Signed)
Patient called to scheduled her Mammogram and was advised that she needed to call to our office back for corrected orders. They stated that they would need diagnostic orders not a screening mammogram. Please advise, thanks.    Below are the correct orders for a Diagnostic MM with Bilateral US  [id number]   MM DIAG BREAST TOMO BILATERAL [id number]   US BREAST LTD UNI LEFT INC AXILLA [id number]   US BREAST LTD UNI RIGHT INC AXILLA

## 2021-01-13 NOTE — Telephone Encounter (Signed)
Her last mammogram was normal screening  Why does she need a diagnostic?  What diagnosis?  Thanks

## 2021-01-14 DIAGNOSIS — N644 Mastodynia: Secondary | ICD-10-CM | POA: Insufficient documentation

## 2021-01-14 NOTE — Telephone Encounter (Signed)
I can put the order in so she does not have to come back, I just needed to know why Cancel the f/u   So sorry about last visit, I think I tried to prioritize the wrong thing and ran out of time My fault     I put the orders in  Thanks

## 2021-01-14 NOTE — Telephone Encounter (Signed)
Pt said she mentioned at last appt that she was having breast issues and that's why she needed mammogram. I don't see any documentation of this at last OV with Dr. Glori Bickers. Per [facility] pt does have to have an appt to eval breast issues and document exactly what's going on before they will schedule diagnostic mammogram/US. Pt said her left breast is bigger then the right and she is feeling pressure in that breast. Pt was upset because she said Dr. Glori Bickers didn't do a breast exam last appt because she said she "didn't have time" and that is one of the reasons she came in. Since no documentation of breast issues pt agreed to come back to discuss this on 01/17/21 but upset because she has no insurance and now has to have 2 separate appts   FYI to Dr. Glori Bickers

## 2021-01-14 NOTE — Telephone Encounter (Signed)
Pt notified and will call them to schedule appt

## 2021-01-17 ENCOUNTER — Ambulatory Visit: Payer: Self-pay | Admitting: Family Medicine

## 2021-01-21 ENCOUNTER — Ambulatory Visit
Admission: RE | Admit: 2021-01-21 | Discharge: 2021-01-21 | Disposition: A | Discharge status: Home / Self Care | Payer: Self-pay | Source: Ambulatory Visit | Attending: Oncology | Admitting: Oncology

## 2021-01-21 ENCOUNTER — Other Ambulatory Visit: Payer: Self-pay

## 2021-01-21 ENCOUNTER — Ambulatory Visit: Payer: Self-pay | Attending: Oncology | Admitting: *Deleted

## 2021-01-21 ENCOUNTER — Encounter: Payer: Self-pay | Admitting: *Deleted

## 2021-01-21 VITALS — BP 122/84 | HR 72 | Temp 97.7°F | Ht 64.0 in | Wt 163.0 lb

## 2021-01-21 DIAGNOSIS — N63 Unspecified lump in unspecified breast: Secondary | ICD-10-CM | POA: Insufficient documentation

## 2021-01-21 NOTE — Progress Notes (Signed)
  Subjective:     Patient ID: Judy Huang, female   DOB: 05-13-1966, 55 y.o.   MRN: [id number]  HPI   Review of Systems     Objective:   Physical Exam Chest:  Breasts:    Breasts are asymmetrical.     Right: No swelling, bleeding, inverted nipple, mass, nipple discharge, skin change, tenderness, axillary adenopathy or supraclavicular adenopathy.     Left: No swelling, bleeding, inverted nipple, mass, nipple discharge, skin change, tenderness, axillary adenopathy or supraclavicular adenopathy.       Comments: Left breast is approximately 2 cup sizes larger than the right Lymphadenopathy:     Upper Body:     Right upper body: No supraclavicular or axillary adenopathy.     Left upper body: No supraclavicular or axillary adenopathy.      Assessment:     55 year old female referred to [place] by [facility] clinic for recent patient complaints of enlarging left breast, and left breast pressure.  Patient states she has been having an "uncomfortable pressure" at 12:00 left breast that is constant.  States it has been present for about 2-3 months.  States the she noticed her left breast getting larger over the last 2-3 weeks.  States it's always been a "tiny bit bigger, but is a lot larger now."  On clinical breast exam there is notable scarring from her bilateral breast reduction.  There is no dominant mass, skin changes, nipple discharge or lymphadenopathy.  The left breast is about 2 cup sizes larger than the right breast. Taught self breast awareness.  Patient with a history of hysterectomy.  Pap omitted per protocol.  Patient has been screened for eligibility.  She does not have any insurance, Medicare or Medicaid.  She also meets financial eligibility.   Risk Assessment     Risk Scores       01/21/2021   Last edited by: Theodore Demark, RN   5-year risk: 1.1 %   Lifetime risk: 7.4 %               Plan:     Patient sent for bilateral diagnostic mammogram and ultrasound.   Discussed if no findings on imaging to follow up with her PCP to discuss further evaluation of her "breast' pressure.  She is agreeable to the plan.

## 2021-01-22 ENCOUNTER — Other Ambulatory Visit: Payer: Self-pay

## 2021-01-22 ENCOUNTER — Ambulatory Visit (INDEPENDENT_AMBULATORY_CARE_PROVIDER_SITE_OTHER): Payer: Self-pay | Admitting: Family Medicine

## 2021-01-22 ENCOUNTER — Encounter: Payer: Self-pay | Admitting: *Deleted

## 2021-01-22 ENCOUNTER — Ambulatory Visit (INDEPENDENT_AMBULATORY_CARE_PROVIDER_SITE_OTHER)
Admission: RE | Admit: 2021-01-22 | Discharge: 2021-01-22 | Disposition: A | Discharge status: Home / Self Care | Payer: Self-pay | Source: Ambulatory Visit | Attending: Family Medicine | Admitting: Family Medicine

## 2021-01-22 ENCOUNTER — Encounter: Payer: Self-pay | Admitting: Family Medicine

## 2021-01-22 VITALS — BP 126/68 | HR 70 | Temp 96.9°F | Ht 64.0 in | Wt 162.4 lb

## 2021-01-22 DIAGNOSIS — R062 Wheezing: Secondary | ICD-10-CM

## 2021-01-22 DIAGNOSIS — R059 Cough, unspecified: Secondary | ICD-10-CM

## 2021-01-22 DIAGNOSIS — R053 Chronic cough: Secondary | ICD-10-CM

## 2021-01-22 DIAGNOSIS — N951 Menopausal and female climacteric states: Secondary | ICD-10-CM

## 2021-01-22 DIAGNOSIS — R0789 Other chest pain: Secondary | ICD-10-CM

## 2021-01-22 DIAGNOSIS — N644 Mastodynia: Secondary | ICD-10-CM

## 2021-01-22 DIAGNOSIS — N6489 Other specified disorders of breast: Secondary | ICD-10-CM

## 2021-01-22 MED ORDER — ALBUTEROL SULFATE HFA 108 (90 BASE) MCG/ACT IN AERS
2.0000 | INHALATION_SPRAY | RESPIRATORY_TRACT | 3 refills | Status: DC | PRN
Start: 2021-01-22 — End: 2022-10-08

## 2021-01-22 NOTE — Progress Notes (Signed)
Subjective:    Patient ID: [name], female    DOB: 08/26/1965, 55 y.o.   MRN: [id number]  This visit occurred during the SARS-CoV-2 public health emergency.  Safety protocols were in place, including screening questions prior to the visit, additional usage of staff PPE, and extensive cleaning of exam room while observing appropriate contact time as indicated for disinfecting solutions.   HPI Pt presents for f/u from mammogram for breast pain   Wt Readings from Last 3 Encounters:  01/22/21 162 lb 7 oz (73.7 kg)  01/21/21 163 lb (73.9 kg)  12/30/20 167 lb (75.8 kg)   27.88 kg/m   Has had a cough since she had covid  Chest congestion   Then pressure on the L side- under and around breast No sharp pain  Can feel a pulling sensation under L arm  Noticed that her L breast seems larger   No heavy lifting  No trauma   Cough is improved with claritin and inhaler and flonase Starting to cough up some mucous also  Feels the pressure to take a deep breath   Breathing fine  Not sob   She has lost a little weight  186 -lost down to 162   (in 2 months lost 10 lb)  Doing the opta via program  Active but not exercising in a program   Wears a supportive bra  Not bouncing   No fam hx of breast cancer   ? Hormonal - is 54     Mammogram 7/5 ReporMS DIGITAL DIAG TOMO BILAT (Accession [id number]) (Order [id number]) Imaging Date: 01/21/2021 Department: [facility] Released By: Vicente Males B Authorizing: Lloyd Huger, MD    Exam Status  Status  Final [99]   PACS Intelerad Image Link   Show images for MS DIGITAL DIAG TOMO BILAT  Study Result  Narrative & Impression  CLINICAL DATA:  55 year old female presenting for annual exam as well as for evaluation of left-sided chest pressure and possible left breast enlargement.   EXAM: DIGITAL DIAGNOSTIC BILATERAL MAMMOGRAM WITH TOMOSYNTHESIS AND CAD   TECHNIQUE: Bilateral  digital diagnostic mammography and breast tomosynthesis was performed. The images were evaluated with computer-aided detection.   COMPARISON:  Previous exam(s).   ACR Breast Density Category b: There are scattered areas of fibroglandular density.   FINDINGS: Right breast: No suspicious mass, distortion, or microcalcifications are identified to suggest presence of malignancy.   Left breast: No suspicious mass, distortion, or microcalcifications are identified to suggest presence of malignancy.   IMPRESSION: No mammographic evidence of malignancy bilaterally or other finding to explain the patient's left-sided chest pressure/possible breast enlargement.   RECOMMENDATION: 1.  Recommend clinical follow-up for the left chest pressure.   2.  Screening mammogram in one year.(Code:SM-B-01Y)   I have discussed the findings and recommendations with the patient. If applicable, a reminder letter will be sent to the patient regarding the next appointment.   BI-RADS CATEGORY  1: Negative.    Patient Active Problem List   Diagnosis Date Noted   Chest pressure 01/22/2021   Breast asymmetry 01/22/2021   Breast pain 01/14/2021   Encounter for screening mammogram for breast cancer 12/31/2020   Prediabetes 12/30/2020   Elevated TSH 12/30/2020   Allergic rhinitis 12/30/2020   Overweight (BMI 25.0-29.9) 12/30/2020   Chronic cough 10/02/2020   Vitamin D deficiency 05/31/2020   Palpitations 09/06/2018   Essential hypertension 09/10/2017   HLD (hyperlipidemia) 09/06/2017   Polyp of sigmoid  colon    Vasomotor symptoms due to menopause 09/10/2016   History of nonmelanoma skin cancer 10/16/2015   Past Medical History:  Diagnosis Date   Arthritis    Chicken pox    Depression    GERD (gastroesophageal reflux disease)    High blood pressure    High cholesterol    History of gastroesophageal reflux (GERD)    History of headache    History of migraine    History of UTI    Polyp of  colon    PONV (postoperative nausea and vomiting)    Past Surgical History:  Procedure Laterality Date   BREAST REDUCTION SURGERY  2000   CESAREAN SECTION  1990   COLONOSCOPY WITH PROPOFOL N/A 01/22/2017   Procedure: COLONOSCOPY WITH PROPOFOL;  Surgeon: [name], MD;  Location: [place];  Service: Endoscopy;  Laterality: N/A;   PARTIAL HYSTERECTOMY  1997   POLYPECTOMY  01/22/2017   Procedure: POLYPECTOMY;  Surgeon: [name], MD;  Location: [place];  Service: Endoscopy;;   REDUCTION MAMMAPLASTY Bilateral 2000   BILAT   Social History   Tobacco Use   Smoking status: Former    Pack years: 0.00    Types: Cigarettes    Start date: 07/26/2013    Quit date: 2016    Years since quitting: 6.5   Smokeless tobacco: Never  Substance Use Topics   Alcohol use: No    Comment: rare   Drug use: No   Family History  Problem Relation Age of Onset   Arthritis Mother    Arthritis Father    Hyperlipidemia Father    Stroke Father    High blood pressure Father    Diabetes Father    Arthritis Maternal Grandfather    Cancer Maternal Grandfather    Early death Maternal Grandfather    Arthritis Maternal Grandmother    Stroke Maternal Grandmother    Birth defects Maternal Grandmother    High Cholesterol Maternal Grandmother    Cancer Paternal Grandmother    Alcohol abuse Paternal Grandmother    Heart attack Paternal Grandmother    Hearing loss Paternal Grandfather    Breast cancer Neg Hx    Allergies  Allergen Reactions   Other     Dissolvable sutures cause "reaction"   Percocet [Oxycodone-Acetaminophen] Nausea Only   Current Outpatient Medications on File Prior to Visit  Medication Sig Dispense Refill   BABY ASPIRIN PO Take by mouth.     fluticasone (FLONASE) 50 MCG/ACT nasal spray Place 2 sprays into both nostrils daily. 16 g 6   hydrochlorothiazide (MICROZIDE) 12.5 MG capsule Take 1 capsule (12.5 mg total) by mouth daily. 90 capsule 3   Multiple Vitamin  (MULTIVITAMIN) capsule Take 1 capsule by mouth daily.     omeprazole (PRILOSEC) 20 MG capsule Take 1 capsule (20 mg total) by mouth daily. 90 capsule 0   Probiotic Product (PROBIOTIC DAILY PO) Take by mouth.     No current facility-administered medications on file prior to visit.     Review of Systems  Constitutional:  Negative for activity change, appetite change, fatigue, fever and unexpected weight change.  HENT:  Negative for congestion, ear pain, rhinorrhea, sinus pressure and sore throat.   Eyes:  Negative for pain, redness and visual disturbance.  Respiratory:  Positive for cough. Negative for shortness of breath, wheezing and stridor.        Does not wheeze frequently but needs albuterol mdi refill  Cardiovascular:  Negative for chest pain  and palpitations.  Gastrointestinal:  Negative for abdominal pain, blood in stool, constipation and diarrhea.  Endocrine: Negative for polydipsia and polyuria.  Genitourinary:  Negative for dysuria, frequency, pelvic pain and urgency.  Musculoskeletal:  Negative for arthralgias, back pain and myalgias.  Skin:  Negative for pallor and rash.  Allergic/Immunologic: Negative for environmental allergies.  Neurological:  Negative for dizziness, syncope and headaches.  Hematological:  Negative for adenopathy. Does not bruise/bleed easily.  Psychiatric/Behavioral:  Negative for decreased concentration and dysphoric mood. The patient is not nervous/anxious.       Objective:   Physical Exam Constitutional:      General: She is not in acute distress.    Appearance: Normal appearance. She is normal weight. She is not ill-appearing or diaphoretic.  Eyes:     General: No scleral icterus.    Conjunctiva/sclera: Conjunctivae normal.     Pupils: Pupils are equal, round, and reactive to light.  Cardiovascular:     Rate and Rhythm: Normal rate and regular rhythm.     Heart sounds: Normal heart sounds.  Pulmonary:     Effort: Pulmonary effort is normal.  No respiratory distress.     Breath sounds: Normal breath sounds. No stridor. No wheezing, rhonchi or rales.  Chest:     Chest wall: No tenderness.  Abdominal:     General: Abdomen is flat. Bowel sounds are normal. There is no distension.     Palpations: Abdomen is soft. There is no mass.     Tenderness: There is no abdominal tenderness.  Genitourinary:    Comments: Breast exam: No mass, nodules, thickening, tenderness, bulging, retraction, inflamation, nipple discharge or skin changes noted.  No axillary or clavicular LA.    Breast reduction scars noted  L breast is slightly larger than the R   Musculoskeletal:     Cervical back: Normal range of motion and neck supple. No tenderness.     Right lower leg: No edema.     Left lower leg: No edema.  Lymphadenopathy:     Cervical: No cervical adenopathy.  Skin:    Coloration: Skin is not pale.     Findings: No erythema or rash.  Neurological:     Mental Status: She is alert.     Cranial Nerves: No cranial nerve deficit.  Psychiatric:        Mood and Affect: Mood normal.          Assessment & Plan:   Problem List Items Addressed This Visit       Other   Vasomotor symptoms due to menopause    Recent menopause age 39 Tolerating for now  Enc good self care       Chronic cough - Primary    S/p uri with some L chest discomfort cxr orderd Reassuring exam  No report of GERD       Relevant Orders   DG Chest 2 View (Completed)   Breast pain    L breast feels bigger and with more pull/pressure on muscle underneath it  This also followed illness with cough Suspect muscle strain  cxr ordered-pend report       Chest pressure    L sided after uri/cough cxr ordered       Relevant Orders   DG Chest 2 View (Completed)   Breast asymmetry    With recent normal mammogram  Past breast reduction, wt gain and loss and hormonal change likely play a role  Nl exam and enc self exams Supportive  bra exp for exercise  recommended         Other Visit Diagnoses     Wheeze       Relevant Medications   albuterol (VENTOLIN HFA) 108 (90 Base) MCG/ACT inhaler   Cough       Relevant Medications   albuterol (VENTOLIN HFA) 108 (90 Base) MCG/ACT inhaler

## 2021-01-22 NOTE — Patient Instructions (Signed)
Take care of yourself   Let's check a cxr today  We will contact you with a result   If discomfort or cough worsen or new symptoms let me know

## 2021-01-22 NOTE — Assessment & Plan Note (Signed)
With recent normal mammogram  Past breast reduction, wt gain and loss and hormonal change likely play a role  Nl exam and enc self exams Supportive bra exp for exercise recommended

## 2021-01-22 NOTE — Assessment & Plan Note (Signed)
L sided after uri/cough cxr ordered

## 2021-01-22 NOTE — Assessment & Plan Note (Signed)
L breast feels bigger and with more pull/pressure on muscle underneath it  This also followed illness with cough Suspect muscle strain  cxr ordered-pend report

## 2021-01-22 NOTE — Progress Notes (Signed)
Patient with birads 1 findings on imaging.  Per our discussion, patient was encouraged to follow up with her primary care provider for further evaluation of her breast/chest pressure.

## 2021-01-22 NOTE — Assessment & Plan Note (Signed)
S/p uri with some L chest discomfort cxr orderd Reassuring exam  No report of GERD

## 2021-01-22 NOTE — Assessment & Plan Note (Signed)
Recent menopause age 55 Tolerating for now  Enc good self care

## 2021-02-21 ENCOUNTER — Encounter
Admission: EM | Disposition: A | Discharge status: Home / Self Care | Payer: Self-pay | Source: Home / Self Care | Attending: Student in an Organized Health Care Education/Training Program

## 2021-02-21 ENCOUNTER — Telehealth: Payer: Self-pay

## 2021-02-21 ENCOUNTER — Observation Stay
Admission: EM | Admit: 2021-02-21 | Discharge: 2021-02-22 | Disposition: A | Discharge status: Home / Self Care | Payer: Self-pay | Attending: General Surgery | Admitting: General Surgery

## 2021-02-21 ENCOUNTER — Observation Stay: Payer: Self-pay | Admitting: Anesthesiology

## 2021-02-21 ENCOUNTER — Encounter: Payer: Self-pay | Admitting: Intensive Care

## 2021-02-21 ENCOUNTER — Emergency Department: Payer: Self-pay

## 2021-02-21 ENCOUNTER — Other Ambulatory Visit: Payer: Self-pay

## 2021-02-21 DIAGNOSIS — K353 Acute appendicitis with localized peritonitis, without perforation or gangrene: Secondary | ICD-10-CM

## 2021-02-21 DIAGNOSIS — I10 Essential (primary) hypertension: Secondary | ICD-10-CM | POA: Insufficient documentation

## 2021-02-21 DIAGNOSIS — K3580 Unspecified acute appendicitis: Secondary | ICD-10-CM

## 2021-02-21 DIAGNOSIS — Z79899 Other long term (current) drug therapy: Secondary | ICD-10-CM | POA: Insufficient documentation

## 2021-02-21 DIAGNOSIS — Z20822 Contact with and (suspected) exposure to covid-19: Secondary | ICD-10-CM | POA: Insufficient documentation

## 2021-02-21 HISTORY — DX: Acute appendicitis with localized peritonitis, without perforation or gangrene: K35.30

## 2021-02-21 HISTORY — PX: XI ROBOTIC LAPAROSCOPIC ASSISTED APPENDECTOMY: SHX6877

## 2021-02-21 LAB — CBC WITH DIFFERENTIAL/PLATELET
Abs Immature Granulocytes: 0.06 10*3/uL (ref 0.00–0.07)
Basophils Absolute: 0 10*3/uL (ref 0.0–0.1)
Basophils Relative: 0 %
Eosinophils Absolute: 0 10*3/uL (ref 0.0–0.5)
Eosinophils Relative: 0 %
HCT: 40.4 % (ref 36.0–46.0)
Hemoglobin: 14 g/dL (ref 12.0–15.0)
Immature Granulocytes: 0 %
Lymphocytes Relative: 6 %
Lymphs Abs: 1 10*3/uL (ref 0.7–4.0)
MCH: 31.2 pg (ref 26.0–34.0)
MCHC: 34.7 g/dL (ref 30.0–36.0)
MCV: 90 fL (ref 80.0–100.0)
Monocytes Absolute: 0.3 10*3/uL (ref 0.1–1.0)
Monocytes Relative: 2 %
Neutro Abs: 14.7 10*3/uL — ABNORMAL HIGH (ref 1.7–7.7)
Neutrophils Relative %: 92 %
Platelets: 349 10*3/uL (ref 150–400)
RBC: 4.49 MIL/uL (ref 3.87–5.11)
RDW: 12 % (ref 11.5–15.5)
WBC: 16.1 10*3/uL — ABNORMAL HIGH (ref 4.0–10.5)
nRBC: 0 % (ref 0.0–0.2)

## 2021-02-21 LAB — COMPREHENSIVE METABOLIC PANEL
ALT: 28 U/L (ref 0–44)
AST: 22 U/L (ref 15–41)
Albumin: 4.4 g/dL (ref 3.5–5.0)
Alkaline Phosphatase: 76 U/L (ref 38–126)
Anion gap: 10 (ref 5–15)
BUN: 21 mg/dL — ABNORMAL HIGH (ref 6–20)
CO2: 26 mmol/L (ref 22–32)
Calcium: 9.7 mg/dL (ref 8.9–10.3)
Chloride: 103 mmol/L (ref 98–111)
Creatinine, Ser: 0.83 mg/dL (ref 0.44–1.00)
GFR, Estimated: >60 mL/min (ref >60)
Glucose, Bld: 149 mg/dL — ABNORMAL HIGH (ref 70–99)
Potassium: 3.6 mmol/L (ref 3.5–5.1)
Sodium: 139 mmol/L (ref 135–145)
Total Bilirubin: 0.7 mg/dL (ref 0.3–1.2)
Total Protein: 7.9 g/dL (ref 6.5–8.1)

## 2021-02-21 LAB — URINALYSIS, COMPLETE (UACMP) WITH MICROSCOPIC
Bilirubin Urine: NEGATIVE
Glucose, UA: NEGATIVE mg/dL
Ketones, ur: 5 mg/dL — AB
Leukocytes,Ua: NEGATIVE
Nitrite: NEGATIVE
Protein, ur: NEGATIVE mg/dL
Specific Gravity, Urine: >1.046 — ABNORMAL HIGH (ref 1.005–1.030)
Squamous Epithelial / HPF: NONE SEEN (ref 0–5)
pH: 5 (ref 5.0–8.0)

## 2021-02-21 LAB — RESP PANEL BY RT-PCR (FLU A&B, COVID) ARPGX2
Influenza A by PCR: NEGATIVE
Influenza B by PCR: NEGATIVE
SARS Coronavirus 2 by RT PCR: NEGATIVE

## 2021-02-21 LAB — LIPASE, BLOOD: Lipase: 24 U/L (ref 11–51)

## 2021-02-21 SURGERY — APPENDECTOMY, ROBOT-ASSISTED, LAPAROSCOPIC
Anesthesia: General | Site: Abdomen

## 2021-02-21 MED ORDER — IOHEXOL 350 MG/ML SOLN
75.0000 mL | Freq: Once | INTRAVENOUS | Status: AC | PRN
  Administered 2021-02-21: 75 mL via INTRAVENOUS

## 2021-02-21 MED ORDER — DEXAMETHASONE SODIUM PHOSPHATE 10 MG/ML IJ SOLN
INTRAMUSCULAR | Status: DC | PRN
  Administered 2021-02-21: 5 mg via INTRAVENOUS

## 2021-02-21 MED ORDER — SODIUM CHLORIDE 0.9 % IV SOLN: INTRAVENOUS | Status: DC

## 2021-02-21 MED ORDER — CHLORHEXIDINE GLUCONATE 0.12 % MT SOLN
OROMUCOSAL | Status: AC
  Administered 2021-02-21: 15 mL via OROMUCOSAL
  Filled 2021-02-21: qty 15

## 2021-02-21 MED ORDER — LACTATED RINGERS IV BOLUS
1000.0000 mL | Freq: Once | INTRAVENOUS | Status: AC
  Administered 2021-02-21: 1000 mL via INTRAVENOUS

## 2021-02-21 MED ORDER — PIPERACILLIN-TAZOBACTAM 3.375 G IVPB
INTRAVENOUS | Status: AC
  Filled 2021-02-21: qty 50

## 2021-02-21 MED ORDER — MIDAZOLAM HCL 2 MG/2ML IJ SOLN
INTRAMUSCULAR | Status: AC
  Filled 2021-02-21: qty 2

## 2021-02-21 MED ORDER — ENOXAPARIN SODIUM 40 MG/0.4ML IJ SOSY
40.0000 mg | PREFILLED_SYRINGE | INTRAMUSCULAR | Status: DC
  Administered 2021-02-22: 40 mg via SUBCUTANEOUS
  Filled 2021-02-21: qty 0.4

## 2021-02-21 MED ORDER — CHLORHEXIDINE GLUCONATE 0.12 % MT SOLN: 15.0000 mL | Freq: Once | OROMUCOSAL | Status: AC

## 2021-02-21 MED ORDER — MIDAZOLAM HCL 2 MG/2ML IJ SOLN
INTRAMUSCULAR | Status: DC | PRN
  Administered 2021-02-21: 2 mg via INTRAVENOUS

## 2021-02-21 MED ORDER — ONDANSETRON 4 MG PO TBDP: 4.0000 mg | ORAL_TABLET | Freq: Four times a day (QID) | ORAL | Status: DC | PRN

## 2021-02-21 MED ORDER — APREPITANT 40 MG PO CAPS: 40.0000 mg | ORAL_CAPSULE | Freq: Once | ORAL | Status: AC

## 2021-02-21 MED ORDER — PANTOPRAZOLE SODIUM 40 MG PO TBEC
40.0000 mg | DELAYED_RELEASE_TABLET | Freq: Every day | ORAL | Status: DC
  Administered 2021-02-22: 40 mg via ORAL
  Filled 2021-02-21: qty 1

## 2021-02-21 MED ORDER — KETOROLAC TROMETHAMINE 30 MG/ML IJ SOLN
INTRAMUSCULAR | Status: AC
  Filled 2021-02-21: qty 1

## 2021-02-21 MED ORDER — ACETAMINOPHEN 325 MG PO TABS
650.0000 mg | ORAL_TABLET | Freq: Four times a day (QID) | ORAL | Status: DC | PRN
  Administered 2021-02-21: 650 mg via ORAL

## 2021-02-21 MED ORDER — FLUTICASONE PROPIONATE 50 MCG/ACT NA SUSP
2.0000 | Freq: Every day | NASAL | Status: DC
  Filled 2021-02-21: qty 16

## 2021-02-21 MED ORDER — HYDROMORPHONE HCL 1 MG/ML IJ SOLN
INTRAMUSCULAR | Status: AC
  Filled 2021-02-21: qty 0.5

## 2021-02-21 MED ORDER — LACTATED RINGERS IV SOLN: INTRAVENOUS | Status: DC

## 2021-02-21 MED ORDER — SUGAMMADEX SODIUM 200 MG/2ML IV SOLN
INTRAVENOUS | Status: DC | PRN
  Administered 2021-02-21: 150 mg via INTRAVENOUS

## 2021-02-21 MED ORDER — EPHEDRINE 5 MG/ML INJ
INTRAVENOUS | Status: AC
  Filled 2021-02-21: qty 5

## 2021-02-21 MED ORDER — EPHEDRINE SULFATE 50 MG/ML IJ SOLN
INTRAMUSCULAR | Status: DC | PRN
  Administered 2021-02-21 (×2): 10 mg via INTRAVENOUS

## 2021-02-21 MED ORDER — PROMETHAZINE HCL 25 MG/ML IJ SOLN: 6.2500 mg | INTRAMUSCULAR | Status: DC | PRN

## 2021-02-21 MED ORDER — PIPERACILLIN-TAZOBACTAM 3.375 G IVPB
3.3750 g | Freq: Three times a day (TID) | INTRAVENOUS | Status: DC
  Administered 2021-02-21 – 2021-02-22 (×3): 3.375 g via INTRAVENOUS
  Filled 2021-02-21 (×2): qty 50

## 2021-02-21 MED ORDER — 0.9 % SODIUM CHLORIDE (POUR BTL) OPTIME
TOPICAL | Status: DC | PRN
  Administered 2021-02-21: 500 mL

## 2021-02-21 MED ORDER — ACETAMINOPHEN 325 MG PO TABS
ORAL_TABLET | ORAL | Status: AC
  Filled 2021-02-21: qty 2

## 2021-02-21 MED ORDER — ROCURONIUM BROMIDE 100 MG/10ML IV SOLN
INTRAVENOUS | Status: DC | PRN
  Administered 2021-02-21: 50 mg via INTRAVENOUS

## 2021-02-21 MED ORDER — FENTANYL CITRATE (PF) 100 MCG/2ML IJ SOLN
INTRAMUSCULAR | Status: AC
  Filled 2021-02-21: qty 2

## 2021-02-21 MED ORDER — MORPHINE SULFATE (PF) 4 MG/ML IV SOLN: 4.0000 mg | INTRAVENOUS | Status: DC | PRN

## 2021-02-21 MED ORDER — FENTANYL CITRATE (PF) 100 MCG/2ML IJ SOLN: 25.0000 ug | INTRAMUSCULAR | Status: DC | PRN

## 2021-02-21 MED ORDER — MORPHINE SULFATE (PF) 4 MG/ML IV SOLN
4.0000 mg | INTRAVENOUS | Status: DC | PRN
  Administered 2021-02-21: 4 mg via INTRAVENOUS
  Filled 2021-02-21: qty 1

## 2021-02-21 MED ORDER — ONDANSETRON HCL 4 MG/2ML IJ SOLN
INTRAMUSCULAR | Status: DC | PRN
Start: 2021-02-21 — End: 2021-02-21
  Administered 2021-02-21: 4 mg via INTRAVENOUS

## 2021-02-21 MED ORDER — APREPITANT 40 MG PO CAPS
ORAL_CAPSULE | ORAL | Status: AC
  Administered 2021-02-21: 40 mg via ORAL
  Filled 2021-02-21: qty 1

## 2021-02-21 MED ORDER — ALBUTEROL SULFATE HFA 108 (90 BASE) MCG/ACT IN AERS
2.0000 | INHALATION_SPRAY | RESPIRATORY_TRACT | Status: DC | PRN
  Filled 2021-02-21: qty 6.7

## 2021-02-21 MED ORDER — ONDANSETRON HCL 4 MG/2ML IJ SOLN: 4.0000 mg | Freq: Four times a day (QID) | INTRAMUSCULAR | Status: DC | PRN

## 2021-02-21 MED ORDER — ONDANSETRON HCL 4 MG/2ML IJ SOLN
INTRAMUSCULAR | Status: AC
  Filled 2021-02-21: qty 2

## 2021-02-21 MED ORDER — ORAL CARE MOUTH RINSE: 15.0000 mL | Freq: Once | OROMUCOSAL | Status: AC

## 2021-02-21 MED ORDER — HYDROMORPHONE HCL 1 MG/ML IJ SOLN
0.5000 mg | INTRAMUSCULAR | Status: DC | PRN
  Administered 2021-02-21: 0.5 mg via INTRAVENOUS

## 2021-02-21 MED ORDER — BUPIVACAINE-EPINEPHRINE (PF) 0.5% -1:200000 IJ SOLN
INTRAMUSCULAR | Status: DC | PRN
  Administered 2021-02-21: 30 mL via PERINEURAL

## 2021-02-21 MED ORDER — KETOROLAC TROMETHAMINE 30 MG/ML IJ SOLN
INTRAMUSCULAR | Status: DC | PRN
  Administered 2021-02-21: 30 mg via INTRAVENOUS

## 2021-02-21 MED ORDER — HYDROMORPHONE HCL 1 MG/ML IJ SOLN
0.5000 mg | INTRAMUSCULAR | Status: DC | PRN
  Administered 2021-02-21: 0.5 mg via INTRAVENOUS
  Filled 2021-02-21: qty 1

## 2021-02-21 MED ORDER — ROCURONIUM BROMIDE 10 MG/ML (PF) SYRINGE
PREFILLED_SYRINGE | INTRAVENOUS | Status: AC
  Filled 2021-02-21: qty 10

## 2021-02-21 MED ORDER — ONDANSETRON HCL 4 MG/2ML IJ SOLN
4.0000 mg | Freq: Once | INTRAMUSCULAR | Status: AC
  Administered 2021-02-21: 4 mg via INTRAVENOUS
  Filled 2021-02-21: qty 2

## 2021-02-21 MED ORDER — PROPOFOL 10 MG/ML IV BOLUS
INTRAVENOUS | Status: DC | PRN
  Administered 2021-02-21: 150 mg via INTRAVENOUS

## 2021-02-21 MED ORDER — PIPERACILLIN-TAZOBACTAM 3.375 G IVPB 30 MIN
3.3750 g | Freq: Once | INTRAVENOUS | Status: AC
  Administered 2021-02-21: 3.375 g via INTRAVENOUS
  Filled 2021-02-21: qty 50

## 2021-02-21 MED ORDER — ACETAMINOPHEN 650 MG RE SUPP: 650.0000 mg | Freq: Four times a day (QID) | RECTAL | Status: DC | PRN

## 2021-02-21 MED ORDER — FENTANYL CITRATE (PF) 100 MCG/2ML IJ SOLN
INTRAMUSCULAR | Status: DC | PRN
  Administered 2021-02-21 (×2): 50 ug via INTRAVENOUS

## 2021-02-21 MED ORDER — BUPIVACAINE-EPINEPHRINE (PF) 0.5% -1:200000 IJ SOLN
INTRAMUSCULAR | Status: AC
  Filled 2021-02-21: qty 30

## 2021-02-21 MED ORDER — LIDOCAINE HCL (CARDIAC) PF 100 MG/5ML IV SOSY
PREFILLED_SYRINGE | INTRAVENOUS | Status: DC | PRN
Start: 2021-02-21 — End: 2021-02-21
  Administered 2021-02-21: 60 mg via INTRAVENOUS

## 2021-02-21 MED ORDER — HYDROCODONE-ACETAMINOPHEN 5-325 MG PO TABS
1.0000 | ORAL_TABLET | ORAL | Status: DC | PRN
  Administered 2021-02-21: 2 via ORAL
  Filled 2021-02-21: qty 2

## 2021-02-21 MED ORDER — DEXAMETHASONE SODIUM PHOSPHATE 10 MG/ML IJ SOLN
INTRAMUSCULAR | Status: AC
  Filled 2021-02-21: qty 1

## 2021-02-21 MED ORDER — HYDROCHLOROTHIAZIDE 12.5 MG PO CAPS
12.5000 mg | ORAL_CAPSULE | Freq: Every day | ORAL | Status: DC
  Administered 2021-02-22: 12.5 mg via ORAL
  Filled 2021-02-21: qty 1

## 2021-02-21 MED ORDER — PHENYLEPHRINE HCL (PRESSORS) 10 MG/ML IV SOLN
INTRAVENOUS | Status: DC | PRN
  Administered 2021-02-21: 50 ug via INTRAVENOUS

## 2021-02-21 SURGICAL SUPPLY — 63 items
BAG INFUSER PRESSURE 100CC (MISCELLANEOUS) IMPLANT
BLADE SURG SZ11 CARB STEEL (BLADE) ×2 IMPLANT
CANISTER SUCT 1200ML W/VALVE (MISCELLANEOUS) IMPLANT
CANNULA REDUC XI 12-8 STAPL (CANNULA) ×1
CANNULA REDUCER 12-8 DVNC XI (CANNULA) ×1 IMPLANT
CHLORAPREP W/TINT 26 (MISCELLANEOUS) ×2 IMPLANT
COVER TIP SHEARS 8 DVNC (MISCELLANEOUS) ×1 IMPLANT
COVER TIP SHEARS 8MM DA VINCI (MISCELLANEOUS) ×1
DEFOGGER SCOPE WARMER CLEARIFY (MISCELLANEOUS) ×2 IMPLANT
DERMABOND ADVANCED (GAUZE/BANDAGES/DRESSINGS) ×1
DERMABOND ADVANCED .7 DNX12 (GAUZE/BANDAGES/DRESSINGS) ×1 IMPLANT
DRAPE ARM DVNC X/XI (DISPOSABLE) ×4 IMPLANT
DRAPE COLUMN DVNC XI (DISPOSABLE) ×1 IMPLANT
DRAPE DA VINCI XI ARM (DISPOSABLE) ×4
DRAPE DA VINCI XI COLUMN (DISPOSABLE) ×1
ELECT REM PT RETURN 9FT ADLT (ELECTROSURGICAL) ×2
ELECTRODE REM PT RTRN 9FT ADLT (ELECTROSURGICAL) ×1 IMPLANT
GAUZE 4X4 16PLY ~~LOC~~+RFID DBL (SPONGE) ×2 IMPLANT
GLOVE SURG SYN 6.5 ES PF (GLOVE) ×4 IMPLANT
GLOVE SURG UNDER POLY LF SZ6.5 (GLOVE) ×4 IMPLANT
GOWN STRL REUS W/ TWL LRG LVL3 (GOWN DISPOSABLE) ×3 IMPLANT
GOWN STRL REUS W/TWL LRG LVL3 (GOWN DISPOSABLE) ×3
GRASPER SUT TROCAR 14GX15 (MISCELLANEOUS) ×2 IMPLANT
IRRIGATOR SUCT 8 DISP DVNC XI (IRRIGATION / IRRIGATOR) IMPLANT
IRRIGATOR SUCTION 8MM XI DISP (IRRIGATION / IRRIGATOR)
IV NS 1000ML (IV SOLUTION)
IV NS 1000ML BAXH (IV SOLUTION) IMPLANT
KIT PINK PAD W/HEAD ARE REST (MISCELLANEOUS) ×2 IMPLANT
KIT PINK PAD W/HEAD ARM REST (MISCELLANEOUS) ×1 IMPLANT
LABEL OR SOLS (LABEL) IMPLANT
MANIFOLD NEPTUNE II (INSTRUMENTS) ×2 IMPLANT
NEEDLE HYPO 22GX1.5 SAFETY (NEEDLE) ×2 IMPLANT
NEEDLE INSUFFLATION 14GA 120MM (NEEDLE) ×2 IMPLANT
OBTURATOR OPTICAL STANDARD 8MM (TROCAR) ×1
OBTURATOR OPTICAL STND 8 DVNC (TROCAR) ×1
OBTURATOR OPTICALSTD 8 DVNC (TROCAR) ×1 IMPLANT
PACK LAP CHOLECYSTECTOMY (MISCELLANEOUS) ×2 IMPLANT
POUCH SPECIMEN RETRIEVAL 10MM (ENDOMECHANICALS) ×2 IMPLANT
RELOAD STAPLER 2.5X45 WHT DVNC (STAPLE) IMPLANT
RELOAD STAPLER 3.5X45 BLU DVNC (STAPLE) IMPLANT
SEAL CANN UNIV 5-8 DVNC XI (MISCELLANEOUS) ×3 IMPLANT
SEAL XI 5MM-8MM UNIVERSAL (MISCELLANEOUS) ×3
SEALER VESSEL DA VINCI XI (MISCELLANEOUS)
SEALER VESSEL EXT DVNC XI (MISCELLANEOUS) IMPLANT
SET TUBE SMOKE EVAC HIGH FLOW (TUBING) ×2 IMPLANT
SOLUTION ELECTROLUBE (MISCELLANEOUS) ×2 IMPLANT
SPONGE T-LAP 4X18 ~~LOC~~+RFID (SPONGE) ×2 IMPLANT
STAPLER 45 DA VINCI SURE FORM (STAPLE)
STAPLER 45 SUREFORM DVNC (STAPLE) IMPLANT
STAPLER CANNULA SEAL DVNC XI (STAPLE) ×1 IMPLANT
STAPLER CANNULA SEAL XI (STAPLE) ×1
STAPLER RELOAD 2.5X45 WHITE (STAPLE)
STAPLER RELOAD 2.5X45 WHT DVNC (STAPLE)
STAPLER RELOAD 3.5X45 BLU DVNC (STAPLE)
STAPLER RELOAD 3.5X45 BLUE (STAPLE)
SUT MNCRL AB 4-0 PS2 18 (SUTURE) ×2 IMPLANT
SUT MON AB 5-0 P3 18 (SUTURE) ×2 IMPLANT
SUT VIC AB 2-0 SH 27 (SUTURE)
SUT VIC AB 2-0 SH 27XBRD (SUTURE) IMPLANT
SUT VICRYL 0 AB UR-6 (SUTURE) ×2 IMPLANT
SUT VLOC 90 6 CV-15 VIOLET (SUTURE) ×2 IMPLANT
SYR 30ML LL (SYRINGE) ×2 IMPLANT
TRAY FOLEY MTR SLVR 16FR STAT (SET/KITS/TRAYS/PACK) ×2 IMPLANT

## 2021-02-21 NOTE — ED Triage Notes (Signed)
C/O LLQ abdominal pain and vomiting. Onset  of symptoms this morning at midnight.

## 2021-02-21 NOTE — H&P (Signed)
SURGICAL CONSULTATION NOTE   HISTORY OF PRESENT ILLNESS (HPI):  55 y.o. female presented to [facility] ED for evaluation of abdominal pian. Patient reports pain started this morning at midnight.  Pain localized to the lower mid abdomen.  Pain does not radiate to other part of the body.  Pain is aggravated by applying pressure.  She developed nausea and vomiting.  She denies any fever.  There has been no alleviating factors.  At the ED she was found with leukocytosis.  CT scan of the abdomen shows thickening of the appendix with fat stranding.  There is also appendicolith.  Pain localized on the lower midline.  No sign of free air or perforation.  No abscess.  I personally evaluated the images.  Surgery is consulted by Dr. Quentin Cornwall in this context for evaluation and management of acute appendicitis.  PAST MEDICAL HISTORY (PMH):  Past Medical History:  Diagnosis Date   Arthritis    Chicken pox    Depression    GERD (gastroesophageal reflux disease)    High blood pressure    High cholesterol    History of gastroesophageal reflux (GERD)    History of headache    History of migraine    History of UTI    Polyp of colon    PONV (postoperative nausea and vomiting)      PAST SURGICAL HISTORY ([place]):  Past Surgical History:  Procedure Laterality Date   BREAST REDUCTION SURGERY  2000   CESAREAN SECTION  1990   COLONOSCOPY WITH PROPOFOL N/A 01/22/2017   Procedure: COLONOSCOPY WITH PROPOFOL;  Surgeon: [name], MD;  Location: [place];  Service: Endoscopy;  Laterality: N/A;   PARTIAL HYSTERECTOMY  1997   POLYPECTOMY  01/22/2017   Procedure: POLYPECTOMY;  Surgeon: [name], MD;  Location: [place];  Service: Endoscopy;;   REDUCTION MAMMAPLASTY Bilateral 2000   BILAT     MEDICATIONS:  Prior to Admission medications   Medication Sig Start Date End Date Taking? Authorizing Provider  albuterol (VENTOLIN HFA) 108 (90 Base) MCG/ACT inhaler Inhale 2 puffs into the lungs  every 4 (four) hours as needed for wheezing or shortness of breath (cough, shortness of breath or wheezing.). 01/22/21   Tower, Wynelle Fanny, MD  BABY ASPIRIN PO Take by mouth.    [provider]  fluticasone (FLONASE) 50 MCG/ACT nasal spray Place 2 sprays into both nostrils daily. 03/29/20   Elby Beck, FNP  hydrochlorothiazide (MICROZIDE) 12.5 MG capsule Take 1 capsule (12.5 mg total) by mouth daily. 03/29/20   Elby Beck, FNP  Multiple Vitamin (MULTIVITAMIN) capsule Take 1 capsule by mouth daily.    [provider]  omeprazole (PRILOSEC) 20 MG capsule Take 1 capsule (20 mg total) by mouth daily. 12/20/20   Dutch Quint B, FNP  Probiotic Product (PROBIOTIC DAILY PO) Take by mouth.    [provider]     ALLERGIES:  Allergies  Allergen Reactions   Other     Dissolvable sutures cause "reaction"   Percocet [Oxycodone-Acetaminophen] Nausea Only     SOCIAL HISTORY:  Social History   Socioeconomic History   Marital status: Widowed    Spouse name: Not on file   Number of children: Not on file   Years of education: Not on file   Highest education level: Not on file  Occupational History   Not on file  Tobacco Use   Smoking status: Former    Types: Cigarettes    Start date: 07/26/2013  Quit date: 2016    Years since quitting: 6.5   Smokeless tobacco: Never  Substance and Sexual Activity   Alcohol use: No    Comment: rare   Drug use: No   Sexual activity: Not on file  Other Topics Concern   Not on file  Social History Narrative   Not on file   Social Determinants of Health   Financial Resource Strain: Not on file  Food Insecurity: Not on file  Transportation Needs: Not on file  Physical Activity: Not on file  Stress: Not on file  Social Connections: Not on file  Intimate Partner Violence: Not on file      FAMILY HISTORY:  Family History  Problem Relation Age of Onset   Arthritis Mother    Arthritis Father    Hyperlipidemia Father     Stroke Father    High blood pressure Father    Diabetes Father    Arthritis Maternal Grandfather    Cancer Maternal Grandfather    Early death Maternal Grandfather    Arthritis Maternal Grandmother    Stroke Maternal Grandmother    Birth defects Maternal Grandmother    High Cholesterol Maternal Grandmother    Cancer Paternal Grandmother    Alcohol abuse Paternal Grandmother    Heart attack Paternal Grandmother    Hearing loss Paternal Grandfather    Breast cancer Neg Hx      REVIEW OF SYSTEMS:  Constitutional: denies weight loss, fever, chills, or sweats  Eyes: denies any other vision changes, history of eye injury  ENT: denies sore throat, hearing problems  Respiratory: denies shortness of breath, wheezing  Cardiovascular: denies chest pain, palpitations  Gastrointestinal: positive abdominal pain, nausea and vomiting Genitourinary: denies burning with urination or urinary frequency Musculoskeletal: denies any other joint pains or cramps  Skin: denies any other rashes or skin discolorations  Neurological: denies any other headache, dizziness, weakness  Psychiatric: denies any other depression, anxiety   All other review of systems were negative   VITAL SIGNS:  Temp:  [98.5 F (36.9 C)] 98.5 F (36.9 C) (08/05 0818) Pulse Rate:  [63-77] 67 (08/05 1000) Resp:  [18] 18 (08/05 1000) BP: (134-157)/(88-97) 157/88 (08/05 1000) SpO2:  [93 %-100 %] 93 % (08/05 1000) Weight:  [72.6 kg] 72.6 kg (08/05 0819)     Height: '5\' 4"'$  (162.6 cm) Weight: 72.6 kg BMI (Calculated): 27.45   INTAKE/OUTPUT:  This shift: No intake/output data recorded.  Last 2 shifts: '@IOLAST2SHIFTS'$ @   PHYSICAL EXAM:  Constitutional:  -- Normal body habitus  -- Awake, alert, and oriented x3  Eyes:  -- Pupils equally round and reactive to light  -- No scleral icterus  Ear, nose, and throat:  -- No jugular venous distension  Pulmonary:  -- No crackles  -- Equal breath sounds bilaterally -- Breathing  non-labored at rest Cardiovascular:  -- S1, S2 present  -- No pericardial rubs Gastrointestinal:  -- Abdomen soft, tender in lower mid abdomen non-distended, no guarding or rebound tenderness -- No abdominal masses appreciated, pulsatile or otherwise  Musculoskeletal and Integumentary:  -- Wounds or skin discoloration: None appreciated -- Extremities: B/L UE and LE FROM, hands and feet warm, no edema  Neurologic:  -- Motor function: intact and symmetric -- Sensation: intact and symmetric   Labs:  CBC Latest Ref Rng & Units 02/21/2021 03/29/2020 09/06/2018  WBC 4.0 - 10.5 K/uL 16.1(H) 8.4 8.4  Hemoglobin 12.0 - 15.0 g/dL 14.0 14.4 14.4  Hematocrit 36.0 - 46.0 % 40.4 42.0  42.0  Platelets 150 - 400 K/uL 349 389.0 397.0   CMP Latest Ref Rng & Units 02/21/2021 03/29/2020 09/06/2018  Glucose 70 - 99 mg/dL 149(H) 76 83  BUN 6 - 20 mg/dL 21(H) 17 12  Creatinine 0.44 - 1.00 mg/dL 0.83 0.82 0.78  Sodium 135 - 145 mmol/L 139 140 139  Potassium 3.5 - 5.1 mmol/L 3.6 4.2 4.3  Chloride 98 - 111 mmol/L 103 105 104  CO2 22 - 32 mmol/L '26 31 30  '$ Calcium 8.9 - 10.3 mg/dL 9.7 10.4 10.1  Total Protein 6.5 - 8.1 g/dL 7.9 7.7 7.9  Total Bilirubin 0.3 - 1.2 mg/dL 0.7 0.3 0.3  Alkaline Phos 38 - 126 U/L 76 80 83  AST 15 - 41 U/L '22 21 17  '$ ALT 0 - 44 U/L '28 26 22    '$ Imaging studies:  EXAM: CT ABDOMEN AND PELVIS WITH CONTRAST   TECHNIQUE: Multidetector CT imaging of the abdomen and pelvis was performed using the standard protocol following bolus administration of intravenous contrast.   CONTRAST:  51m OMNIPAQUE IOHEXOL 350 MG/ML SOLN   COMPARISON:  Report from 02/25/2004   FINDINGS: Lower chest:  No contributory findings.   Hepatobiliary: Small hepatic cysts measuring up to 19 mm in the left lobe and also reported previously.No evidence of biliary obstruction or stone.   Pancreas: Unremarkable.   Spleen: Unremarkable.   Adrenals/Urinary Tract: Negative adrenals. No hydronephrosis  or stone. Tiny left renal cortical cyst. Unremarkable bladder.   Stomach/Bowel: Dilated appendix with central low-density and a few calculi. Subtle adjacent fat stranding on reformats. Few left colonic diverticula without acute inflammation.   Vascular/Lymphatic: No acute vascular abnormality. Mild atheromatous wall thickening of the aorta. No mass or adenopathy.   Reproductive:Hysterectomy   Other: No ascites or pneumoperitoneum.   Musculoskeletal: No acute abnormalities. Prominent lumbar facet osteoarthritis.   IMPRESSION: Dilated appendix with calculi and subtle adjacent stranding, compatible with early appendicitis in the appropriate clinical setting. The appendix is in the midline, is patient's pain also right-sided?     Electronically Signed   By: JMonte FantasiaM.D.   On: 02/21/2021 09:53  Assessment/Plan:  55y.o. female with acute appendicitis, complicated by pertinent comorbidities including hypertension.  Patient with history, physical exam and images consistent with acute appendicitis. Patient oriented about diagnosis and surgical management as treatment. Patient oriented about goals of surgery and its risk including: bowel injury, infection, abscess, bleeding, leak from cecum, intestinal adhesions, bowel obstruction, fistula, injury to the ureter among others.  Patient understood and agreed to proceed with surgery. Will admit patient, already started on antibiotic therapy, will give IV hydration since patient is NPO and schedule to OR.   EArnold Long MD

## 2021-02-21 NOTE — Telephone Encounter (Signed)
Per chart review tab pt is at [facility] ED. Sending note to [place] pool.

## 2021-02-21 NOTE — Op Note (Signed)
Pre-op Diagnosis: Acute appendicitis   Post op Diagnosis: Acute appenditicis  Procedure: Robotic assisted laparoscopic appendectomy.  Anesthesia: GETA  Surgeon: Herbert Pun, MD, FACS  Wound Classification: clean contaminated  Specimen: Appendix  Complications: None  Estimated Blood Loss: 3 mL   Indications: Patient is a 55 y.o. female  presented with above right lower quadrant pain. CT scan shows acute appendicitis.     FIndings: 1.  Irritated appendix with appendicolith 2. No peri-appendiceal abscess or phlegmon 3. Normal anatomy 4. Adequate hemostasis.     Description of procedure: The patient was placed on the operating table in the supine position. General anesthesia was induced. A time-out was completed verifying correct patient, procedure, site, positioning, and implant(s) and/or special equipment prior to beginning this procedure. The abdomen was prepped and draped in the usual sterile fashion.   Palmer's point located and Veress needle was inserted.  After confirming 2 clicks and a positive saline drop test, gas insufflation was initiated until the abdominal pressure was measured at 15 mmHg.  Afterwards, the Veress needle was removed and a 8 mm port was placed in left upper quadrant area using Optiview technique.  After local was infused, 3 additional incision on the left abdominal wall were made 5 cm apart.  An 12 mm port and two other 8 mm ports were placed under direct visualization.  No injuries from trocar placements were noted.  The table was placed in the Trendelenburg position with the right side elevated.  With the use of Tip up grasper, Force Bipolar and scissors, an inflamed appendix was identified and elevated.  Window created at base of appendix in the mesentery.    The mesoappendix was divided with combination of force bipolar and energy scissors. The base of the appendix was ligated with 3-0 Vloc and the appendix was divided with scissors. The appendix  was placed in an endoscopic retrieval bag and removed.   The appendiceal stump was examined and hemostasis noted. No other pathology was identified within pelvis. The 12 mm trocar removed and port site closed with PMI using 0 vicryl under direct vision. Remaining trocars were removed under direct vision. No bleeding was noted.The abdomen was allowed to collapse.  All skin incisions then closed with subcuticular sutures Monocryl 4-0.  Wounds then dressed with dermabond.  The patient tolerated the procedure well, awakened from anesthesia and was taken to the postanesthesia care unit in satisfactory condition.  Sponge count and instrument count correct at the end of the procedure.

## 2021-02-21 NOTE — Anesthesia Procedure Notes (Signed)
Procedure Name: Intubation Date/Time: 02/21/2021 4:33 PM Performed by: Jonna Clark, CRNA Pre-anesthesia Checklist: Patient identified, Patient being monitored, Timeout performed, Emergency Drugs available and Suction available Patient Re-evaluated:Patient Re-evaluated prior to induction Oxygen Delivery Method: Circle system utilized Preoxygenation: Pre-oxygenation with 100% oxygen Induction Type: IV induction Ventilation: Mask ventilation without difficulty Laryngoscope Size: 3 and McGraph Grade View: Grade I Tube type: Oral Tube size: 7.0 mm Number of attempts: 1 Airway Equipment and Method: Stylet Placement Confirmation: ETT inserted through vocal cords under direct vision, positive ETCO2 and breath sounds checked- equal and bilateral Secured at: 21 cm Tube secured with: Tape Dental Injury: Teeth and Oropharynx as per pre-operative assessment

## 2021-02-21 NOTE — ED Notes (Signed)
Surgeon at bedside.  

## 2021-02-21 NOTE — Anesthesia Postprocedure Evaluation (Signed)
Anesthesia Post Note  Patient: Judy Huang  Procedure(s) Performed: XI ROBOTIC LAPAROSCOPIC ASSISTED APPENDECTOMY (Abdomen)  Patient location during evaluation: PACU Anesthesia Type: General Level of consciousness: awake and alert Pain management: pain level controlled Vital Signs Assessment: post-procedure vital signs reviewed and stable Respiratory status: spontaneous breathing, nonlabored ventilation, respiratory function stable and patient connected to nasal cannula oxygen Cardiovascular status: blood pressure returned to baseline and stable Postop Assessment: no apparent nausea or vomiting Anesthetic complications: no   No notable events documented.   Last Vitals:  Vitals:   02/21/21 1840 02/21/21 1920  BP: 109/64 116/69  Pulse: 73 70  Resp: 16 16  Temp: 36.9 C 36.5 C  SpO2: 96% 95%    Last Pain:  Vitals:   02/21/21 1840  TempSrc:   PainSc: 3                  Tonny Bollman

## 2021-02-21 NOTE — Progress Notes (Signed)
Time out pre-procedure recorded at 1602 was recorded an error. Continue to monitor

## 2021-02-21 NOTE — ED Notes (Signed)
Patient transported to CT 

## 2021-02-21 NOTE — Telephone Encounter (Signed)
[facility] Night - Client TELEPHONE ADVICE RECORD [facility] Patient Name: Judy Huang Gender: Female DOB: 28-Nov-1965 Age: 55 Y 5 D Return Phone Number: [phone] (Primary), [phone] (Secondary) Address: City/ State/ Zip: [place] [address] Client [facility] Night - Client Client Site [facility] - PHYSICIAN, Verita Schneiders- MD Contact Type Call Who Is Calling Patient / Member / Family / Caregiver Call Type Triage / Clinical Caller Name Kaysi Relationship To Patient Daughter Return Phone Number [phone] (Primary) Chief Complaint Abdominal Pain Reason for Call Symptomatic / Request for [place] states her mother is experiencing back pain with abdominal pain and nausea. Additional Comment DR. Unknown Translation No Nurse Assessment Nurse: Raphael Gibney, RN, Vera Date/Time (Eastern Time): 02/21/2021 7:48:06 AM Confirm and document reason for call. If symptomatic, describe symptoms. ---Caller states she has abd pain that is radiating to her back. has pain on the left side. she has been vomiting since midnight. Pain level 9. pain has been constant since midnight. Does the patient have any new or worsening symptoms? ---Yes Will a triage be completed? ---Yes Related visit to physician within the last 2 weeks? ---No Does the PT have any chronic conditions? (i.e. diabetes, asthma, this includes High risk factors for pregnancy, etc.) ---No Is this a behavioral health or substance abuse call? ---No Guidelines Guideline Title Affirmed Question Affirmed Notes Nurse Date/Time Eilene Ghazi Time) Abdominal Pain - Female [1] SEVERE pain (e.g., excruciating) AND [2] present > 1 hour Raphael Gibney, RN, Vera 02/21/2021 7:50:12 AM Disp. Time Eilene Ghazi Time) Disposition Final User 02/21/2021 7:52:21 AM Go to ED Now Yes Raphael Gibney, RN, Vanita Ingles PLEASE NOTE: All  timestamps contained within this report are represented as [place] Standard Time. CONFIDENTIALTY NOTICE: This fax transmission is intended only for the addressee. It contains information that is legally privileged, confidential or otherwise protected from use or disclosure. If you are not the intended recipient, you are strictly prohibited from reviewing, disclosing, copying using or disseminating any of this information or taking any action in reliance on or regarding this information. If you have received this fax in error, please notify us immediately by telephone so that we can arrange for its return to [name]. Phone: [phone], Toll-Free: [phone], Fax: [phone] Page: 2 of 2 Call Id: [id number] Caller Disagree/Comply Comply Caller Understands Yes PreDisposition Did not know what to do Care Advice Given Per Guideline GO TO ED NOW: * You need to be seen in the Emergency Department. ANOTHER ADULT SHOULD DRIVE: * It is better and safer if another adult drives instead of you. Referrals [facility] - ED

## 2021-02-21 NOTE — ED Provider Notes (Signed)
[facility] Emergency Department Provider Note    Event Date/Time   First MD Initiated Contact with Patient 02/21/21 [phone]     (approximate)  I have reviewed the triage vital signs and the nursing notes.   HISTORY  Chief Complaint Abdominal Pain    HPI Judy Huang is a 55 y.o. female below listed past medical history presents to the ER for sudden onset of left lower quadrant abdominal pain that occurred around midnight while at home.  States it was associated with nausea vomiting loose nonbloody diarrhea.  Has never had symptoms like this before.  Denies any dysuria.  States that yesterday she was feeling well.  Tried tried to take an antiemetic at home but was not able to keep it down.  Past Medical History:  Diagnosis Date   Arthritis    Chicken pox    Depression    GERD (gastroesophageal reflux disease)    High blood pressure    High cholesterol    History of gastroesophageal reflux (GERD)    History of headache    History of migraine    History of UTI    Polyp of colon    PONV (postoperative nausea and vomiting)    Family History  Problem Relation Age of Onset   Arthritis Mother    Arthritis Father    Hyperlipidemia Father    Stroke Father    High blood pressure Father    Diabetes Father    Arthritis Maternal Grandfather    Cancer Maternal Grandfather    Early death Maternal Grandfather    Arthritis Maternal Grandmother    Stroke Maternal Grandmother    Birth defects Maternal Grandmother    High Cholesterol Maternal Grandmother    Cancer Paternal Grandmother    Alcohol abuse Paternal Grandmother    Heart attack Paternal Grandmother    Hearing loss Paternal Grandfather    Breast cancer Neg Hx    Past Surgical History:  Procedure Laterality Date   BREAST REDUCTION SURGERY  2000   CESAREAN SECTION  1990   COLONOSCOPY WITH PROPOFOL N/A 01/22/2017   Procedure: COLONOSCOPY WITH PROPOFOL;  Surgeon: [name], MD;  Location:  [facility];  Service: Endoscopy;  Laterality: N/A;   PARTIAL HYSTERECTOMY  1997   POLYPECTOMY  01/22/2017   Procedure: POLYPECTOMY;  Surgeon: [name], MD;  Location: [place];  Service: Endoscopy;;   REDUCTION MAMMAPLASTY Bilateral 2000   BILAT   Patient Active Problem List   Diagnosis Date Noted   Chest pressure 01/22/2021   Breast asymmetry 01/22/2021   Breast pain 01/14/2021   Encounter for screening mammogram for breast cancer 12/31/2020   Prediabetes 12/30/2020   Elevated TSH 12/30/2020   Allergic rhinitis 12/30/2020   Overweight (BMI 25.0-29.9) 12/30/2020   Chronic cough 10/02/2020   Vitamin D deficiency 05/31/2020   Palpitations 09/06/2018   Essential hypertension 09/10/2017   HLD (hyperlipidemia) 09/06/2017   Polyp of sigmoid colon    Vasomotor symptoms due to menopause 09/10/2016   History of nonmelanoma skin cancer 10/16/2015      Prior to Admission medications   Medication Sig Start Date End Date Taking? Authorizing Provider  albuterol (VENTOLIN HFA) 108 (90 Base) MCG/ACT inhaler Inhale 2 puffs into the lungs every 4 (four) hours as needed for wheezing or shortness of breath (cough, shortness of breath or wheezing.). 01/22/21   Tower, Wynelle Fanny, MD  BABY ASPIRIN PO Take by mouth.    [provider]  fluticasone (FLONASE) 50 MCG/ACT nasal spray Place 2 sprays into both nostrils daily. 03/29/20   Elby Beck, FNP  hydrochlorothiazide (MICROZIDE) 12.5 MG capsule Take 1 capsule (12.5 mg total) by mouth daily. 03/29/20   Elby Beck, FNP  Multiple Vitamin (MULTIVITAMIN) capsule Take 1 capsule by mouth daily.    [provider]  omeprazole (PRILOSEC) 20 MG capsule Take 1 capsule (20 mg total) by mouth daily. 12/20/20   Dutch Quint B, FNP  Probiotic Product (PROBIOTIC DAILY PO) Take by mouth.    [provider]    Allergies Other and Percocet [oxycodone-acetaminophen]    Social History Social History    Tobacco Use   Smoking status: Former    Types: Cigarettes    Start date: 07/26/2013    Quit date: 2016    Years since quitting: 6.5   Smokeless tobacco: Never  Substance Use Topics   Alcohol use: No    Comment: rare   Drug use: No    Review of Systems Patient denies headaches, rhinorrhea, blurry vision, numbness, shortness of breath, chest pain, edema, cough, abdominal pain, nausea, vomiting, diarrhea, dysuria, fevers, rashes or hallucinations unless otherwise stated above in HPI. ____________________________________________   PHYSICAL EXAM:  VITAL SIGNS: Vitals:   02/21/21 0913 02/21/21 0914  BP:    Pulse: 66 63  Resp:    Temp:    SpO2: 97% 100%    Constitutional: Alert and oriented.  Eyes: Conjunctivae are normal.  Head: Atraumatic. Nose: No congestion/rhinnorhea. Mouth/Throat: Mucous membranes are moist.   Neck: No stridor. Painless ROM.  Cardiovascular: Normal rate, regular rhythm. Grossly normal heart sounds.  Good peripheral circulation. Respiratory: Normal respiratory effort.  No retractions. Lungs CTAB. Gastrointestinal: Soft with ttp of suprapubic and llq, no guarding or rebound. No distention. No abdominal bruits. No CVA tenderness. Genitourinary:  Musculoskeletal: No lower extremity tenderness nor edema.  No joint effusions. Neurologic:  Normal speech and language. No gross focal neurologic deficits are appreciated. No facial droop Skin:  Skin is warm, dry and intact. No rash noted. Psychiatric: Mood and affect are normal. Speech and behavior are normal.  ____________________________________________   LABS (all labs ordered are listed, but only abnormal results are displayed)  Results for orders placed or performed during the hospital encounter of 02/21/21 (from the past 24 hour(s))  CBC with Differential/Platelet     Status: Abnormal   Collection Time: 02/21/21  8:42 AM  Result Value Ref Range   WBC 16.1 (H) 4.0 - 10.5 K/uL   RBC 4.49 3.87 - 5.11  MIL/uL   Hemoglobin 14.0 12.0 - 15.0 g/dL   HCT 40.4 36.0 - 46.0 %   MCV 90.0 80.0 - 100.0 fL   MCH 31.2 26.0 - 34.0 pg   MCHC 34.7 30.0 - 36.0 g/dL   RDW 12.0 11.5 - 15.5 %   Platelets 349 150 - 400 K/uL   nRBC 0.0 0.0 - 0.2 %   Neutrophils Relative % 92 %   Neutro Abs 14.7 (H) 1.7 - 7.7 K/uL   Lymphocytes Relative 6 %   Lymphs Abs 1.0 0.7 - 4.0 K/uL   Monocytes Relative 2 %   Monocytes Absolute 0.3 0.1 - 1.0 K/uL   Eosinophils Relative 0 %   Eosinophils Absolute 0.0 0.0 - 0.5 K/uL   Basophils Relative 0 %   Basophils Absolute 0.0 0.0 - 0.1 K/uL   Immature Granulocytes 0 %   Abs Immature Granulocytes 0.06 0.00 - 0.07 K/uL  Comprehensive metabolic panel  Status: Abnormal   Collection Time: 02/21/21  8:42 AM  Result Value Ref Range   Sodium 139 135 - 145 mmol/L   Potassium 3.6 3.5 - 5.1 mmol/L   Chloride 103 98 - 111 mmol/L   CO2 26 22 - 32 mmol/L   Glucose, Bld 149 (H) 70 - 99 mg/dL   BUN 21 (H) 6 - 20 mg/dL   Creatinine, Ser 0.83 0.44 - 1.00 mg/dL   Calcium 9.7 8.9 - 10.3 mg/dL   Total Protein 7.9 6.5 - 8.1 g/dL   Albumin 4.4 3.5 - 5.0 g/dL   AST 22 15 - 41 U/L   ALT 28 0 - 44 U/L   Alkaline Phosphatase 76 38 - 126 U/L   Total Bilirubin 0.7 0.3 - 1.2 mg/dL   GFR, Estimated >60 >60 mL/min   Anion gap 10 5 - 15  Lipase, blood     Status: None   Collection Time: 02/21/21  8:42 AM  Result Value Ref Range   Lipase 24 11 - 51 U/L   ____________________________________________ ____________________________________________  RADIOLOGY  I personally reviewed all radiographic images ordered to evaluate for the above acute complaints and reviewed radiology reports and findings.  These findings were personally discussed with the patient.  Please see medical record for radiology report.  ____________________________________________   PROCEDURES  Procedure(s) performed:  Procedures    Critical Care performed:  no ____________________________________________   INITIAL IMPRESSION / ASSESSMENT AND PLAN / ED COURSE  Pertinent labs & imaging results that were available during my care of the patient were reviewed by me and considered in my medical decision making (see chart for details).   DDX: Diverticulitis, colitis, stone, UTI, appendicitis, ovarian cyst, torsion  Judy Huang is a 55 y.o. who presents to the ED with presentation as described above.  Patient uncomfortable appearing afebrile with abdominal pain as described above.  Blood work sent for above differential order IV narcotic medication as well as IV fluids and IV antiemetic.  Will order CT imaging  Clinical Course as of 02/21/21 1010  Fri Feb 21, 2021  0950 CT ABDOMEN PELVIS W CONTRAST [PR]  1009 CT imaging shows evidence of acute appendicitis.  I discussed case with Dr. Peyton Najjar of general surgery who evaluated patient at bedside.  Agrees with plan for Zosyn.  Patient updated and agreeable to plan. [PR]    Clinical Course User Index [PR] Merlyn Lot, MD    The patient was evaluated in Emergency Department today for the symptoms described in the history of present illness. He/she was evaluated in the context of the global COVID-19 pandemic, which necessitated consideration that the patient might be at risk for infection with the SARS-CoV-2 virus that causes COVID-19. Institutional protocols and algorithms that pertain to the evaluation of patients at risk for COVID-19 are in a state of rapid change based on information released by regulatory bodies including the CDC and federal and state organizations. These policies and algorithms were followed during the patient's care in the ED.  As part of my medical decision making, I reviewed the following data within the [place] notes reviewed and incorporated, Labs reviewed, notes from prior ED visits and [place] Controlled Substance  Database   ____________________________________________   FINAL CLINICAL IMPRESSION(S) / ED DIAGNOSES  Final diagnoses:  Acute appendicitis, unspecified acute appendicitis type      NEW MEDICATIONS STARTED DURING THIS VISIT:  New Prescriptions   No medications on file     Note:  This document was prepared using Dragon voice recognition software and may include unintentional dictation errors.    Merlyn Lot, MD 02/21/21 1010

## 2021-02-21 NOTE — Transfer of Care (Signed)
Immediate Anesthesia Transfer of Care Note  Patient: Judy Huang  Procedure(s) Performed: XI ROBOTIC LAPAROSCOPIC ASSISTED APPENDECTOMY (Abdomen)  Patient Location: PACU  Anesthesia Type:General  Level of Consciousness: drowsy and patient cooperative  Airway & Oxygen Therapy: Patient Spontanous Breathing  Post-op Assessment: Report given to RN and Post -op Vital signs reviewed and stable  Post vital signs: Reviewed and stable  Last Vitals:  Vitals Value Taken Time  BP 104/71 02/21/21 1756  Temp 36.3 C 02/21/21 1756  Pulse 99 02/21/21 1759  Resp 20 02/21/21 1759  SpO2 100 % 02/21/21 1759  Vitals shown include unvalidated device data.  Last Pain:  Vitals:   02/21/21 1311  TempSrc:   PainSc: 10-Worst pain ever         Complications: No notable events documented.

## 2021-02-21 NOTE — Anesthesia Preprocedure Evaluation (Signed)
Anesthesia Evaluation  Patient identified by MRN, date of birth, ID band Patient awake    Reviewed: Allergy & Precautions, H&P , NPO status , Patient's Chart, lab work & pertinent test results, reviewed documented beta blocker date and time   History of Anesthesia Complications (+) PONV and history of anesthetic complications  Airway Mallampati: III  TM Distance: >3 FB Neck ROM: full    Dental  (+) Dental Advidsory Given, Teeth Intact   Pulmonary neg pulmonary ROS, former smoker,    Pulmonary exam normal breath sounds clear to auscultation       Cardiovascular Exercise Tolerance: Good hypertension, (-) angina(-) Past MI and (-) Cardiac Stents Normal cardiovascular exam(-) dysrhythmias (-) Valvular Problems/Murmurs Rhythm:regular Rate:Normal     Neuro/Psych PSYCHIATRIC DISORDERS Depression negative neurological ROS     GI/Hepatic Neg liver ROS, GERD  ,  Endo/Other  negative endocrine ROS  Renal/GU negative Renal ROS  negative genitourinary   Musculoskeletal   Abdominal   Peds  Hematology negative hematology ROS (+)   Anesthesia Other Findings Past Medical History: No date: Arthritis No date: Chicken pox No date: Depression No date: GERD (gastroesophageal reflux disease) No date: High blood pressure No date: High cholesterol No date: History of gastroesophageal reflux (GERD) No date: History of headache No date: History of migraine No date: History of UTI No date: Polyp of colon No date: PONV (postoperative nausea and vomiting)   Reproductive/Obstetrics negative OB ROS                             Anesthesia Physical Anesthesia Plan  ASA: 2  Anesthesia Plan: General   Post-op Pain Management:    Induction: Intravenous  PONV Risk Score and Plan: 4 or greater and TIVA, Propofol infusion, Ondansetron, Dexamethasone, Aprepitant and Treatment may vary due to age or medical  condition  Airway Management Planned: Oral ETT  Additional Equipment:   Intra-op Plan:   Post-operative Plan: Extubation in OR  Informed Consent: I have reviewed the patients History and Physical, chart, labs and discussed the procedure including the risks, benefits and alternatives for the proposed anesthesia with the patient or authorized representative who has indicated his/her understanding and acceptance.     Dental Advisory Given  Plan Discussed with: Anesthesiologist, CRNA and Surgeon  Anesthesia Plan Comments:         Anesthesia Quick Evaluation

## 2021-02-22 ENCOUNTER — Encounter: Payer: Self-pay | Admitting: General Surgery

## 2021-02-22 MED ORDER — HYDROCODONE-ACETAMINOPHEN 5-325 MG PO TABS: 1.0000 | ORAL_TABLET | ORAL | 0 refills | Status: AC | PRN

## 2021-02-22 NOTE — Progress Notes (Signed)
AVS reviewed and pt verbalized understanding of all discharge instruction. NAD noted or voiced concerns at this time.

## 2021-02-22 NOTE — Discharge Instructions (Signed)

## 2021-02-22 NOTE — Discharge Summary (Signed)
  Patient ID: KIRANDEEP FASO MRN: [id number] DOB/AGE: [birth year] 56 y.o.  Admit date: 02/21/2021 Discharge date: 02/22/2021   Discharge Diagnoses:  Active Problems:   Acute appendicitis with localized peritonitis   Procedures: Robotic assisted laparoscopic appendectomy  Hospital Course: Patient with acute appendicitis.  She underwent robotic assisted laparoscopic appendectomy.  She tolerated the procedure well.  The patient this morning with pain control.  She is tolerating diet.  She is ambulating.  Wounds dry and clean.  Physical Exam HENT:     Head: Normocephalic.  Cardiovascular:     Rate and Rhythm: Normal rate.  Pulmonary:     Effort: Pulmonary effort is normal.  Abdominal:     General: Abdomen is flat. Bowel sounds are normal.     Palpations: Abdomen is soft.     Hernia: No hernia is present.  Skin:    General: Skin is warm.  Neurological:     Mental Status: She is alert and oriented to person, place, and time.     Consults: None  Disposition: Discharge disposition: 01-Home or Self Care       Discharge Instructions     Diet - low sodium heart healthy   Complete by: As directed    Increase activity slowly   Complete by: As directed       Allergies as of 02/22/2021       Reactions   Other    Dissolvable sutures cause "reaction"   Percocet [oxycodone-acetaminophen] Nausea Only        Medication List     TAKE these medications    albuterol 108 (90 Base) MCG/ACT inhaler Commonly known as: VENTOLIN HFA Inhale 2 puffs into the lungs every 4 (four) hours as needed for wheezing or shortness of breath (cough, shortness of breath or wheezing.).   ascorbic acid 500 MG tablet Commonly known as: VITAMIN C Take 500 mg by mouth daily.   aspirin EC 81 MG tablet Take 81 mg by mouth daily.   hydrochlorothiazide 12.5 MG capsule Commonly known as: MICROZIDE Take 1 capsule (12.5 mg total) by mouth daily.   HYDROcodone-acetaminophen 5-325 MG  tablet Commonly known as: Norco Take 1 tablet by mouth every 4 (four) hours as needed for up to 3 days for moderate pain.   multivitamin with minerals Tabs tablet Take 1 tablet by mouth daily.   omeprazole 20 MG capsule Commonly known as: PRILOSEC Take 1 capsule (20 mg total) by mouth daily.   vitamin B-12 1000 MCG tablet Commonly known as: CYANOCOBALAMIN Take 1,000 mcg by mouth daily.        Follow-up Information     Herbert Pun, MD Follow up in 2 week(s).   Specialty: General Surgery Why: Follow up after appendectomy Contact information: [place]  [phone]

## 2021-02-22 NOTE — Plan of Care (Signed)

## 2021-02-26 LAB — SURGICAL PATHOLOGY

## 2021-02-27 NOTE — Telephone Encounter (Signed)
Called pt to schedule appt. Pt stated that she did not feel like she needed to make an appt at this time

## 2021-02-27 NOTE — Telephone Encounter (Signed)
She can est care if she wants to since I have seen her several times

## 2021-03-16 ENCOUNTER — Other Ambulatory Visit: Payer: Self-pay | Admitting: Family

## 2021-03-16 DIAGNOSIS — K219 Gastro-esophageal reflux disease without esophagitis: Secondary | ICD-10-CM

## 2021-03-25 NOTE — Telephone Encounter (Signed)
Last OV - 01/22/2021 Next OV - N/A Last Filled - 12/20/2020

## 2021-03-25 NOTE — Telephone Encounter (Signed)
  Encourage patient to contact the pharmacy for refills or they can request refills through [facility]:  Please schedule appointment if longer than 1 year  NEXT APPOINTMENT DATE:  MEDICATION:  omeprazole (PRILOSEC) 20 MG capsule  Is the patient out of medication? yes  PHARMACY:  [facility]- [address]  Let patient know to contact pharmacy at the end of the day to make sure medication is ready.  Please notify patient to allow 48-72 hours to process  CLINICAL FILLS OUT ALL BELOW:   LAST REFILL:  QTY:  REFILL DATE:    OTHER COMMENTS:    Okay for refill?  Please advise

## 2021-04-21 ENCOUNTER — Other Ambulatory Visit: Payer: Self-pay

## 2021-04-21 ENCOUNTER — Ambulatory Visit (INDEPENDENT_AMBULATORY_CARE_PROVIDER_SITE_OTHER): Payer: Self-pay | Admitting: Dermatology

## 2021-04-21 DIAGNOSIS — L409 Psoriasis, unspecified: Secondary | ICD-10-CM

## 2021-04-21 DIAGNOSIS — D225 Melanocytic nevi of trunk: Secondary | ICD-10-CM

## 2021-04-21 DIAGNOSIS — D22121 Melanocytic nevi of left upper eyelid, including canthus: Secondary | ICD-10-CM

## 2021-04-21 DIAGNOSIS — L578 Other skin changes due to chronic exposure to nonionizing radiation: Secondary | ICD-10-CM

## 2021-04-21 DIAGNOSIS — Z85828 Personal history of other malignant neoplasm of skin: Secondary | ICD-10-CM

## 2021-04-21 DIAGNOSIS — L570 Actinic keratosis: Secondary | ICD-10-CM

## 2021-04-21 DIAGNOSIS — I781 Nevus, non-neoplastic: Secondary | ICD-10-CM

## 2021-04-21 DIAGNOSIS — D229 Melanocytic nevi, unspecified: Secondary | ICD-10-CM

## 2021-04-21 DIAGNOSIS — L821 Other seborrheic keratosis: Secondary | ICD-10-CM

## 2021-04-21 NOTE — Patient Instructions (Addendum)
Cryotherapy Aftercare  Wash gently with soap and water everyday.   Apply Vaseline and Band-Aid daily until healed.   Melanoma ABCDEs  Melanoma is the most dangerous type of skin cancer, and is the leading cause of death from skin disease.  You are more likely to develop melanoma if you: Have light-colored skin, light-colored eyes, or red or blond hair Spend a lot of time in the sun Tan regularly, either outdoors or in a tanning bed Have had blistering sunburns, especially during childhood Have a close family member who has had a melanoma Have atypical moles or large birthmarks  Early detection of melanoma is key since treatment is typically straightforward and cure rates are extremely high if we catch it early.   The first sign of melanoma is often a change in a mole or a new dark spot.  The ABCDE system is a way of remembering the signs of melanoma.  A for asymmetry:  The two halves do not match. B for border:  The edges of the growth are irregular. C for color:  A mixture of colors are present instead of an even brown color. D for diameter:  Melanomas are usually (but not always) greater than 48mm - the size of a pencil eraser. E for evolution:  The spot keeps changing in size, shape, and color.  Please check your skin once per month between visits. You can use a small mirror in front and a large mirror behind you to keep an eye on the back side or your body.   If you see any new or changing lesions before your next follow-up, please call to schedule a visit.  Please continue daily skin protection including broad spectrum sunscreen SPF 30+ to sun-exposed areas, reapplying every 2 hours as needed when you're outdoors.    Psoriasis is a chronic non-curable, but treatable genetic/hereditary disease that may have other systemic features affecting other organ systems such as joints (Psoriatic Arthritis). It is associated with an increased risk of inflammatory bowel disease, heart disease,  non-alcoholic fatty liver disease, and depression.    If you have any questions or concerns for your doctor, please call our main line at [phone] and press option 4 to reach your doctor's medical assistant. If no one answers, please leave a voicemail as directed and we will return your call as soon as possible. Messages left after 4 pm will be answered the following business day.   You may also send [name] a message via [place]. We typically respond to MyChart messages within 1-2 business days.  For prescription refills, please ask your pharmacy to contact our office. Our fax number is [phone].  If you have an urgent issue when the clinic is closed that cannot wait until the next business day, you can page your doctor at the number below.    Please note that while we do our best to be available for urgent issues outside of office hours, we are not available 24/7.   If you have an urgent issue and are unable to reach [name], you may choose to seek medical care at your doctor's office, retail clinic, urgent care center, or emergency room.  If you have a medical emergency, please immediately call 911 or go to the emergency department.  Pager Numbers  - Dr. Nehemiah Massed: [phone]  - Dr. Laurence Ferrari: [phone]  - Dr. Nicole Kindred: [phone]  In the event of inclement weather, please call our main line at [phone] for an update on the status of any  delays or closures.  Dermatology Medication Tips: Please keep the boxes that topical medications come in in order to help keep track of the instructions about where and how to use these. Pharmacies typically print the medication instructions only on the boxes and not directly on the medication tubes.   If your medication is too expensive, please contact our office at [phone] option 4 or send [name] a message through [place].   We are unable to tell what your co-pay for medications will be in advance as this is different depending on your  insurance coverage. However, we may be able to find a substitute medication at lower cost or fill out paperwork to get insurance to cover a needed medication.   If a prior authorization is required to get your medication covered by your insurance company, please allow [name] 1-2 business days to complete this process.  Drug prices often vary depending on where the prescription is filled and some pharmacies may offer cheaper prices.  The website www.goodrx.com contains coupons for medications through different pharmacies. The prices here do not account for what the cost may be with help from insurance (it may be cheaper with your insurance), but the website can give you the price if you did not use any insurance.  - You can print the associated coupon and take it with your prescription to the pharmacy.  - You may also stop by our office during regular business hours and pick up a GoodRx coupon card.  - If you need your prescription sent electronically to a different pharmacy, notify our office through [facility] or by phone at [phone] option 4.

## 2021-04-21 NOTE — Progress Notes (Signed)
New Patient Visit  Subjective  Judy Huang is a 55 y.o. female who presents for the following: Skin Problem (New patient here today to have a few spots checked. Patient had Moh's for [facility] at left side of face > 5 years ago and has noticed a bump in the same are, also a new bump at left nose that came up about 3 months ago, both asymptomatic. Patient also with a spot at back that is irritated and has been there for about 6 months. ) and Rash (Rash at hands, present off and on for about 1 year. Patient advises it does itch and comes up as pustules that get red and welp up. Patient was given rx prior but does not recall that it was. ).  The following portions of the chart were reviewed this encounter and updated as appropriate:   Tobacco  Allergies  Meds  Problems  Med Hx  Surg Hx  Fam Hx     Review of Systems:  No other skin or systemic complaints except as noted in HPI or Assessment and Plan.  Objective  Well appearing patient in no apparent distress; mood and affect are within normal limits.  A focused examination was performed including face; neck; hands. Relevant physical exam findings are noted in the Assessment and Plan.  Left upper lateral eyelid, Mid Back spinal 0.3 cm flesh colored papule at left upper lateral eyelid 0.5 cm flesh colored papule at mid back spinal      left nasal bridge x 1 Erythematous thin papules/macules with gritty scale.   palms Scaliness, thickening of the skin, pustules at palms          Assessment & Plan   Nevus (2) Left upper lateral eyelid; Mid Back spinal Vs SK at left upper lateral eyelid Benign-appearing.  Observation.  Call clinic for new or changing lesions.  Recommend daily use of broad spectrum spf 30+ sunscreen to sun-exposed areas.   Does not appear to be recurrent BCC  AK (actinic keratosis) left nasal bridge x 1 Destruction of lesion - left nasal bridge x 1 Complexity: simple   Destruction method: cryotherapy    Informed consent: discussed and consent obtained   Timeout:  patient name, date of birth, surgical site, and procedure verified Lesion destroyed using liquid nitrogen: Yes   Region frozen until ice ball extended beyond lesion: Yes   Outcome: patient tolerated procedure well with no complications   Post-procedure details: wound care instructions given    Psoriasis palms Psoriasis is a chronic non-curable, but treatable genetic/hereditary disease that may have other systemic features affecting other organ systems such as joints (Psoriatic Arthritis). It is associated with an increased risk of inflammatory bowel disease, heart disease, non-alcoholic fatty liver disease, and depression.    Patient does have joint stiffness in the mornings and when sitting for long periods of time. Patient advised may need to refer to rheumatology.   Start [name] once daily to hands. Samples given x 2. Patient will call for prescription. She does not have insurance and will try the samples first.  Lot # [id number] Exp: Oct 2023  History of Basal Cell Carcinoma of the Skin - No evidence of recurrence today at left lateral canthus/temple, excision site looks clear - Recommend regular full body skin exams - Recommend daily broad spectrum sunscreen SPF 30+ to sun-exposed areas, reapply every 2 hours as needed.  - Call if any new or changing lesions are noted between office visits  Seborrheic Keratoses -  Stuck-on, waxy, tan-brown papules and/or plaques  - Benign-appearing - Discussed benign etiology and prognosis. - Observe - Call for any changes  Telangiectasia - Dilated blood vessel - Benign appearing on exam - Call for changes  Melanocytic Nevi - mid back spinal and L upper lateral eyelid - Tan-brown and/or pink-flesh-colored symmetric macules and papules - Benign appearing on exam today - Observation - Call clinic for new or changing moles - Recommend daily use of broad spectrum spf 30+ sunscreen to  sun-exposed areas.   Actinic Damage - chronic, secondary to cumulative UV radiation exposure/sun exposure over time - diffuse scaly erythematous macules with underlying dyspigmentation - Recommend daily broad spectrum sunscreen SPF 30+ to sun-exposed areas, reapply every 2 hours as needed.  - Recommend staying in the shade or wearing long sleeves, sun glasses (UVA+UVB protection) and wide brim hats (4-inch brim around the entire circumference of the hat). - Call for new or changing lesions.  Return in about 4 months (around 08/22/2021) for TBSE.  Graciella Belton, RMA, am acting as scribe for Sarina Ser, MD . Documentation: I have reviewed the above documentation for accuracy and completeness, and I agree with the above.  Sarina Ser, MD

## 2021-04-22 ENCOUNTER — Encounter: Payer: Self-pay | Admitting: Dermatology

## 2021-04-28 ENCOUNTER — Telehealth (INDEPENDENT_AMBULATORY_CARE_PROVIDER_SITE_OTHER): Payer: Self-pay | Admitting: Nurse Practitioner

## 2021-04-28 ENCOUNTER — Other Ambulatory Visit: Payer: Self-pay

## 2021-04-28 DIAGNOSIS — H938X3 Other specified disorders of ear, bilateral: Secondary | ICD-10-CM | POA: Insufficient documentation

## 2021-04-28 DIAGNOSIS — J0120 Acute ethmoidal sinusitis, unspecified: Secondary | ICD-10-CM | POA: Insufficient documentation

## 2021-04-28 DIAGNOSIS — R051 Acute cough: Secondary | ICD-10-CM | POA: Insufficient documentation

## 2021-04-28 DIAGNOSIS — R058 Other specified cough: Secondary | ICD-10-CM | POA: Insufficient documentation

## 2021-04-28 MED ORDER — BENZONATATE 200 MG PO CAPS
200.0000 mg | ORAL_CAPSULE | Freq: Two times a day (BID) | ORAL | 0 refills | Status: AC | PRN
Start: 2021-04-28 — End: 2021-05-08

## 2021-04-28 MED ORDER — FLUTICASONE PROPIONATE 50 MCG/ACT NA SUSP: 2.0000 | Freq: Every day | NASAL | 6 refills | Status: DC

## 2021-04-28 MED ORDER — AMOXICILLIN 500 MG PO CAPS: 500.0000 mg | ORAL_CAPSULE | Freq: Three times a day (TID) | ORAL | 0 refills | Status: AC

## 2021-04-28 NOTE — Assessment & Plan Note (Signed)
Patient symptoms and "exam" consistent with a sinusitis.  We will go ahead and treat patient.  She has amoxicillin in the past. Start amoxicillin 500 mg 3 times daily for 7 days.

## 2021-04-28 NOTE — Assessment & Plan Note (Signed)
Tessalon Perles to use as needed for cough.  She can continue using over-the-counter regimens if beneficial also.

## 2021-04-28 NOTE — Progress Notes (Signed)
Patient ID: Judy Huang, female    DOB: Jun 14, 1966, 55 y.o.   MRN: [id number]  Virtual visit completed through Sarita, a video enabled telemedicine application. Due to national recommendations of social distancing due to COVID-19, a virtual visit is felt to be most appropriate for this patient at this time. Reviewed limitations, risks, security and privacy concerns of performing a virtual visit and the availability of in person appointments. I also reviewed that there may be a patient responsible charge related to this service. The patient agreed to proceed.   Patient location: home Provider location: [place] [facility], office Persons participating in this virtual visit: patient, provider   If any vitals were documented, they were collected by patient at home unless specified below.    There were no vitals taken for this visit.   CC: Congestion, productive cough Subjective:   HPI: Judy Huang is a 55 y.o. female presenting on 04/28/2021 for Head congestion (Sx started around 04/23/21. Chest congestion/heaviness, cough-slight productive-light green color, post nasal drip, runny nose, headache in between her eyes, ears feel clog, sore throat x 1 day only. Covid test negative on 04/23/21 and 04/26/21 negative.)   Symptom started 04/23/2021 At home covid tested. 04/23/2021 and 04/26/2021 that were negative. No covid vaccines Thera flu and mucinex without great relief  Sympotms have gotten worse Has been using albertol inahler that has helped.     Relevant past medical, surgical, family and social history reviewed and updated as indicated. Interim medical history since our last visit reviewed. Allergies and medications reviewed and updated. Outpatient Medications Prior to Visit  Medication Sig Dispense Refill   albuterol (VENTOLIN HFA) 108 (90 Base) MCG/ACT inhaler Inhale 2 puffs into the lungs every 4 (four) hours as needed for wheezing or shortness of breath (cough,  shortness of breath or wheezing.). 6.7 g 3   ascorbic acid (VITAMIN C) 500 MG tablet Take 500 mg by mouth daily.     aspirin EC 81 MG tablet Take 81 mg by mouth daily.     hydrochlorothiazide (MICROZIDE) 12.5 MG capsule Take 1 capsule (12.5 mg total) by mouth daily. 90 capsule 3   Multiple Vitamin (MULTIVITAMIN WITH MINERALS) TABS tablet Take 1 tablet by mouth daily.     omeprazole (PRILOSEC) 20 MG capsule Take 1 capsule by mouth once daily 90 capsule 0   vitamin B-12 (CYANOCOBALAMIN) 1000 MCG tablet Take 1,000 mcg by mouth daily.     No facility-administered medications prior to visit.     Per HPI unless specifically indicated in ROS section below Review of Systems  Constitutional:  Positive for chills, fatigue and fever.  HENT:  Positive for congestion, sinus pressure and sinus pain. Negative for sore throat.   Respiratory:  Positive for cough (light green color). Negative for shortness of breath.   Cardiovascular:  Negative for chest pain.  Gastrointestinal:  Negative for diarrhea, nausea and vomiting.  Neurological:  Positive for headaches.  Objective:  There were no vitals taken for this visit.  Wt Readings from Last 3 Encounters:  02/21/21 160 lb 0.9 oz (72.6 kg)  01/22/21 162 lb 7 oz (73.7 kg)  01/21/21 163 lb (73.9 kg)       Physical exam: Gen: alert, NAD, not ill appearing Pulm: speaks in complete sentences without increased work of breathing Psych: normal mood, normal thought content      Results for orders placed or performed during the hospital encounter of 02/21/21  Resp Panel by RT-PCR (Flu  A&B, Covid) Nasopharyngeal Swab   Specimen: Nasopharyngeal Swab; Nasopharyngeal(NP) swabs in vial transport medium  Result Value Ref Range   SARS Coronavirus 2 by RT PCR NEGATIVE NEGATIVE   Influenza A by PCR NEGATIVE NEGATIVE   Influenza B by PCR NEGATIVE NEGATIVE  CBC with Differential/Platelet  Result Value Ref Range   WBC 16.1 (H) 4.0 - 10.5 K/uL   RBC 4.49 3.87 - 5.11  MIL/uL   Hemoglobin 14.0 12.0 - 15.0 g/dL   HCT 40.4 36.0 - 46.0 %   MCV 90.0 80.0 - 100.0 fL   MCH 31.2 26.0 - 34.0 pg   MCHC 34.7 30.0 - 36.0 g/dL   RDW 12.0 11.5 - 15.5 %   Platelets 349 150 - 400 K/uL   nRBC 0.0 0.0 - 0.2 %   Neutrophils Relative % 92 %   Neutro Abs 14.7 (H) 1.7 - 7.7 K/uL   Lymphocytes Relative 6 %   Lymphs Abs 1.0 0.7 - 4.0 K/uL   Monocytes Relative 2 %   Monocytes Absolute 0.3 0.1 - 1.0 K/uL   Eosinophils Relative 0 %   Eosinophils Absolute 0.0 0.0 - 0.5 K/uL   Basophils Relative 0 %   Basophils Absolute 0.0 0.0 - 0.1 K/uL   Immature Granulocytes 0 %   Abs Immature Granulocytes 0.06 0.00 - 0.07 K/uL  Comprehensive metabolic panel  Result Value Ref Range   Sodium 139 135 - 145 mmol/L   Potassium 3.6 3.5 - 5.1 mmol/L   Chloride 103 98 - 111 mmol/L   CO2 26 22 - 32 mmol/L   Glucose, Bld 149 (H) 70 - 99 mg/dL   BUN 21 (H) 6 - 20 mg/dL   Creatinine, Ser 0.83 0.44 - 1.00 mg/dL   Calcium 9.7 8.9 - 10.3 mg/dL   Total Protein 7.9 6.5 - 8.1 g/dL   Albumin 4.4 3.5 - 5.0 g/dL   AST 22 15 - 41 U/L   ALT 28 0 - 44 U/L   Alkaline Phosphatase 76 38 - 126 U/L   Total Bilirubin 0.7 0.3 - 1.2 mg/dL   GFR, Estimated >60 >60 mL/min   Anion gap 10 5 - 15  Lipase, blood  Result Value Ref Range   Lipase 24 11 - 51 U/L  Urinalysis, Complete w Microscopic  Result Value Ref Range   Color, Urine YELLOW (A) YELLOW   APPearance CLEAR (A) CLEAR   Specific Gravity, Urine >1.046 (H) 1.005 - 1.030   pH 5.0 5.0 - 8.0   Glucose, UA NEGATIVE NEGATIVE mg/dL   Hgb urine dipstick MODERATE (A) NEGATIVE   Bilirubin Urine NEGATIVE NEGATIVE   Ketones, ur 5 (A) NEGATIVE mg/dL   Protein, ur NEGATIVE NEGATIVE mg/dL   Nitrite NEGATIVE NEGATIVE   Leukocytes,Ua NEGATIVE NEGATIVE   RBC / HPF 0-5 0 - 5 RBC/hpf   WBC, UA 0-5 0 - 5 WBC/hpf   Bacteria, UA RARE (A) NONE SEEN   Squamous Epithelial / LPF NONE SEEN 0 - 5   Mucus PRESENT   Surgical pathology  Result Value Ref Range    SURGICAL PATHOLOGY      SURGICAL PATHOLOGY CASE: [id number] PATIENT: Judy Huang Surgical Pathology Report     Specimen Submitted: A. Appendix  Clinical History: Acute appendicitis     DIAGNOSIS: A. APPENDIX; APPENDECTOMY: - ACUTE SUPPURATIVE APPENDICITIS. 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  GROSS DESCRIPTION: A. Labeled: Appendix Received: Formalin Collection time: 5:36 PM on 02/21/2021 Placed into formalin time: 5:37 PM  on 02/21/2021 Size: 6.5 cm long by 1.1 cm in diameter with attached portion of periappendiceal adipose tissue, 6.5 x 2.1 x 1.7 cm External surface: The serosa is tan-brown and dull. Perforation: None grossly appreciated. Fecalith: None grossly appreciated. Description: The proximal margin is inked blue.  The lumen contains tan purulent material.  The lumen is dilated up to 1 cm in diameter and the mucosa is diffusely flattened.  The wall thickness ranges from 0.1 to 0.3 cm.  Block summary: 1 - one half bisected appendiceal  tip and resection margin 2 - representative cross-sections  RB 02/25/2021  Final Diagnosis performed by Bryan Lemma, MD.   Electronically signed 02/26/2021 6:29:25PM The electronic signature indicates that the named Attending Pathologist has evaluated the specimen Technical component performed at [facility], [address] [place], [place] [address] Lab: [phone] Dir: Rush Farmer, MD, MMM  Professional component performed at [facility]: [address] [facility], [place], [place],  [address] Lab: [phone] Dir: Dellia Nims. Reuel Derby, MD    Assessment & Plan:   Problem List Items Addressed This Visit       Respiratory   Acute non-recurrent ethmoidal sinusitis    Patient symptoms and "exam" consistent with a sinusitis.  We will go ahead and treat patient.  She has amoxicillin in the past. Start amoxicillin 500 mg 3 times daily for 7 days.      Relevant Medications    amoxicillin (AMOXIL) 500 MG capsule   fluticasone (FLONASE) 50 MCG/ACT nasal spray   benzonatate (TESSALON) 200 MG capsule     Nervous and Auditory   Sensation of fullness in both ears    States ear fullness bilateral states like when he went to [employer] smoking and then a pop.  We will send in some Flonase to help with eustachian tube drainage and hopefully help with ear fullness.  Continue to monitor. Start Flonase 2 sprays each nostril daily.      Relevant Medications   fluticasone (FLONASE) 50 MCG/ACT nasal spray     Other   Acute cough - Primary    Tessalon Perles to use as needed for cough.  She can continue using over-the-counter regimens if beneficial also.      Relevant Medications   benzonatate (TESSALON) 200 MG capsule     No orders of the defined types were placed in this encounter.  No orders of the defined types were placed in this encounter.   I discussed the assessment and treatment plan with the patient. The patient was provided an opportunity to ask questions and all were answered. The patient agreed with the plan and demonstrated an understanding of the instructions. The patient was advised to call back or seek an in-person evaluation if the symptoms worsen or if the condition fails to improve as anticipated.  Follow up plan: No follow-ups on file.  Romilda Garret, NP

## 2021-04-28 NOTE — Assessment & Plan Note (Signed)
States ear fullness bilateral states like when he went to [facility] smoking and then a pop.  We will send in some Flonase to help with eustachian tube drainage and hopefully help with ear fullness.  Continue to monitor. Start Flonase 2 sprays each nostril daily.

## 2021-05-12 ENCOUNTER — Other Ambulatory Visit: Payer: Self-pay | Admitting: *Deleted

## 2021-05-12 DIAGNOSIS — I10 Essential (primary) hypertension: Secondary | ICD-10-CM

## 2021-05-12 MED ORDER — HYDROCHLOROTHIAZIDE 12.5 MG PO CAPS: 12.5000 mg | ORAL_CAPSULE | Freq: Every day | ORAL | 0 refills | Status: DC

## 2021-05-12 NOTE — Telephone Encounter (Signed)
Patient notified as instructed by telephone and verbalized understanding. Patient scheduled for an in office appointment tomorrow 05/13/21 at 11:20 am with Romilda Garret NP. Patient was given ER precautions and she verbalized understanding. Patient stated that she has done three home covid test and all have been negative.

## 2021-05-12 NOTE — Telephone Encounter (Signed)
Needs an office visit if she is not improving.

## 2021-05-12 NOTE — Telephone Encounter (Signed)
Patient stated that she has finished the antibiotic but is still sick with chest congestion that she can not get up. Does patient need to come in to be seen or can you prescribe her more medication.

## 2021-05-12 NOTE — Telephone Encounter (Signed)
Spoke to patient by telephone and was advised that she has finished the antibiotic that she was given. Patient denies a fever, but states that she has a lot of chest congestion that she is not able to get up. Patient stated that she has a lot of post nasal drip that causes a tickling in her throat. Patient stated that she is having to use the flonase and inhaler more than prescribed because of her symptoms.  Patient stated that she did another home covid test Wednesday 05/07/21 and it was negative like the other two.   Patient stated that she ran of out of blood pressure medication last week. Patient stated when she does not take her blood pressure medication it fluctuates. Patient stated she needs a refill. Patient stated that she does not have a PCP since Carlean Purl NP left and is waiting on a female provider to join the practice.  Pharmacy [facility] Patient was given ER precautions and she verbalized understanding.

## 2021-05-12 NOTE — Telephone Encounter (Signed)
I am confused as this was sent with a refill request? I dont mind refilling her HCTZ for a couple months until [place] joins the team. Are you needing something else from me or just giving me an update??? She needs to take the medications as prescribed that were written for her

## 2021-05-12 NOTE — Telephone Encounter (Signed)
PLEASE NOTE: All timestamps contained within this report are represented as [place] Standard Time. CONFIDENTIALTY NOTICE: This fax transmission is intended only for the addressee. It contains information that is legally privileged, confidential or otherwise protected from use or disclosure. If you are not the intended recipient, you are strictly prohibited from reviewing, disclosing, copying using or disseminating any of this information or taking any action in reliance on or regarding this information. If you have received this fax in error, please notify us immediately by telephone so that we can arrange for its return to [name]. Phone: [phone], Toll-Free: [phone], Fax: [phone] Page: 1 of 2 Call Id: [id number] [place] [facility] - Client TELEPHONE ADVICE RECORD [facility] Patient Name: Judy Huang Gender: Female DOB: December 18, 1965 Age: 55 Y 2 M 22 D Return Phone Number: [phone] (Primary), [phone] (Secondary) Address: City/ State/ Zip:  [place]  [address] Client [facility] - Client Client Site [place] Physician AA - PHYSICIAN, Verita Schneiders- MD Contact Type Call Who Is Calling Patient / Member / Family / Caregiver Call Type Triage / Clinical Relationship To Patient Self Return Phone Number [phone] (Primary) Chief Complaint Cough Reason for Call Symptomatic / Request for [place] states she had a virtual appointment a week ago. She was given an antibiotic. She has a cough and head congestion. Translation No Nurse Assessment Nurse: Donna Christen, RN, Legrand Como Date/Time Eilene Ghazi Time): 05/09/2021 9:16:12 PM Confirm and document reason for call. If symptomatic, describe symptoms. ---Caller states she had a virtual appointment a week ago. She was given an antibiotic. She has a cough and head congestion. No fever at this time. Blood pressure is  high. 154/98 out of medications. Does the patient have any new or worsening symptoms? ---Yes Will a triage be completed? ---Yes Related visit to physician within the last 2 weeks? ---Yes Does the PT have any chronic conditions? (i.e. diabetes, asthma, this includes High risk factors for pregnancy, etc.) ---Yes List chronic conditions. ---HTN Is this a behavioral health or substance abuse call? ---No Guidelines Guideline Title Affirmed Question Affirmed Notes Nurse Date/Time (Eastern Time) Blood Pressure - High Ran out of BP medications Darleen Crocker 05/09/2021 9:17:47 PM Sinus Infection on Antibiotic Follow-up Call [1] Taking antibiotic > 48 hours (2 days) AND [2] fever persists Darleen Crocker 05/09/2021 9:19:54 PM PLEASE NOTE: All timestamps contained within this report are represented as [place] Standard Time. CONFIDENTIALTY NOTICE: This fax transmission is intended only for the addressee. It contains information that is legally privileged, confidential or otherwise protected from use or disclosure. If you are not the intended recipient, you are strictly prohibited from reviewing, disclosing, copying using or disseminating any of this information or taking any action in reliance on or regarding this information. If you have received this fax in error, please notify us immediately by telephone so that we can arrange for its return to [name]. Phone: [phone], Toll-Free: [phone], Fax: [phone] Page: 2 of 2 Call Id: [id number] [place]. Time Eilene Ghazi Time) Disposition Final User 05/09/2021 9:19:34 PM Call PCP within 24 Hours Darleen Crocker 05/09/2021 9:23:15 PM See PCP within 24 Hours Yes Donna Christen, RN, Gerome Sam Disagree/Comply Comply Caller Understands Yes PreDisposition Call Doctor Care Advice Given Per Guideline CALL PCP WITHIN 24 HOURS: CARE ADVICE given per High Blood Pressure (Adult) guideline. SEE PCP WITHIN 24 HOURS: * IBUPROFEN (E.G.,  MOTRIN, ADVIL): Take 400 mg (two 200 mg pills) by mouth every 6  hours. The most you should take is 6 pills a day (1,200 mg total). CARE ADVICE given per Sinus Infection on Antibiotic Follow-Up Call (Adult) guideline. * You become worse * Difficulty breathing (and not relieved by cleaning out nose) CALL BACK IF: Referrals GO TO FACILITY UNDECIDED

## 2021-05-13 ENCOUNTER — Other Ambulatory Visit: Payer: Self-pay

## 2021-05-13 ENCOUNTER — Ambulatory Visit (INDEPENDENT_AMBULATORY_CARE_PROVIDER_SITE_OTHER): Payer: Self-pay | Admitting: Nurse Practitioner

## 2021-05-13 VITALS — BP 128/72 | HR 79 | Temp 98.4°F | Resp 14 | Ht 64.0 in | Wt 168.0 lb

## 2021-05-13 DIAGNOSIS — R058 Other specified cough: Secondary | ICD-10-CM

## 2021-05-13 DIAGNOSIS — H6993 Unspecified Eustachian tube disorder, bilateral: Secondary | ICD-10-CM | POA: Insufficient documentation

## 2021-05-13 DIAGNOSIS — R0982 Postnasal drip: Secondary | ICD-10-CM

## 2021-05-13 DIAGNOSIS — H6983 Other specified disorders of Eustachian tube, bilateral: Secondary | ICD-10-CM

## 2021-05-13 MED ORDER — GUAIFENESIN ER 1200 MG PO TB12: 1200.0000 mg | ORAL_TABLET | Freq: Two times a day (BID) | ORAL | 0 refills | Status: DC

## 2021-05-13 MED ORDER — METHYLPREDNISOLONE ACETATE 40 MG/ML IJ SUSP
40.0000 mg | Freq: Once | INTRAMUSCULAR | Status: AC
  Administered 2021-05-13: 40 mg via INTRAMUSCULAR

## 2021-05-13 NOTE — Patient Instructions (Addendum)
Continue using the flonase and Claritin. Get some Delsyum for your cough. This should be helpful I will send in some mucinex to help thin out the secretions. Drink plenty of water with it

## 2021-05-13 NOTE — Progress Notes (Signed)
Acute Office Visit  Subjective:    Patient ID: Judy Huang, female    DOB: Sep 10, 1965, 55 y.o.   MRN: [id number]  Chief Complaint  Patient presents with   post nasal drainage    Constant. Patient has finished her antibiotic and cough medication. Her post nasal drip got better as far as it is not green color mucus anymore clear, cough present, nasal congestion.    HPI Patient is in today for PND  Congestion and cough with some production with clear sputum and thick  Does take over the counter claritin.  No SHOBat rest just with coughing. Will use the inhaler to help with that Recently finished a course of antibiotics that did help with her symptoms. States that her symptoms are improved but she is not where she wanted to be  Did take all the tessalon Perles and they helped some. Does not want a sedating cough medication. Has also been using the Flonase with some relief  Past Medical History:  Diagnosis Date   Arthritis    Chicken pox    Depression    GERD (gastroesophageal reflux disease)    High blood pressure    High cholesterol    History of gastroesophageal reflux (GERD)    History of headache    History of migraine    History of UTI    Polyp of colon    PONV (postoperative nausea and vomiting)     Past Surgical History:  Procedure Laterality Date   BREAST REDUCTION SURGERY  2000   CESAREAN SECTION  1990   COLONOSCOPY WITH PROPOFOL N/A 01/22/2017   Procedure: COLONOSCOPY WITH PROPOFOL;  Surgeon: [name], MD;  Location: [facility];  Service: Endoscopy;  Laterality: N/A;   PARTIAL HYSTERECTOMY  1997   POLYPECTOMY  01/22/2017   Procedure: POLYPECTOMY;  Surgeon: [name], MD;  Location: [facility];  Service: Endoscopy;;   REDUCTION MAMMAPLASTY Bilateral 2000   BILAT   XI ROBOTIC LAPAROSCOPIC ASSISTED APPENDECTOMY N/A 02/21/2021   Procedure: XI ROBOTIC LAPAROSCOPIC ASSISTED APPENDECTOMY;  Surgeon: Herbert Pun, MD;  Location: [facility];  Service: General;  Laterality: N/A;    Family History  Problem Relation Age of Onset   Arthritis Mother    Arthritis Father    Hyperlipidemia Father    Stroke Father    High blood pressure Father    Diabetes Father    Arthritis Maternal Grandfather    Cancer Maternal Grandfather    Early death Maternal Grandfather    Arthritis Maternal Grandmother    Stroke Maternal Grandmother    Birth defects Maternal Grandmother    High Cholesterol Maternal Grandmother    Cancer Paternal Grandmother    Alcohol abuse Paternal Grandmother    Heart attack Paternal Grandmother    Hearing loss Paternal Grandfather    Breast cancer Neg Hx     Social History   Socioeconomic History   Marital status: Widowed    Spouse name: Not on file   Number of children: Not on file   Years of education: Not on file   Highest education level: Not on file  Occupational History   Not on file  Tobacco Use   Smoking status: Former    Types: Cigarettes    Start date: 07/26/2013    Quit date: 2016    Years since quitting: 6.8   Smokeless tobacco: Never  Substance and Sexual Activity   Alcohol use: No    Comment: rare   Drug  use: No   Sexual activity: Not on file  Other Topics Concern   Not on file  Social History Narrative   Not on file   Social Determinants of Health   Financial Resource Strain: Not on file  Food Insecurity: Not on file  Transportation Needs: Not on file  Physical Activity: Not on file  Stress: Not on file  Social Connections: Not on file  Intimate Partner Violence: Not on file    Outpatient Medications Prior to Visit  Medication Sig Dispense Refill   albuterol (VENTOLIN HFA) 108 (90 Base) MCG/ACT inhaler Inhale 2 puffs into the lungs every 4 (four) hours as needed for wheezing or shortness of breath (cough, shortness of breath or wheezing.). 6.7 g 3   ascorbic acid (VITAMIN C) 500 MG tablet Take 500 mg by mouth daily.     aspirin EC 81 MG tablet Take 81 mg by mouth  daily.     fluticasone (FLONASE) 50 MCG/ACT nasal spray Place 2 sprays into both nostrils daily. 16 g 6   hydrochlorothiazide (MICROZIDE) 12.5 MG capsule Take 1 capsule (12.5 mg total) by mouth daily. You will need an office visit 90 capsule 0   Multiple Vitamin (MULTIVITAMIN WITH MINERALS) TABS tablet Take 1 tablet by mouth daily.     vitamin B-12 (CYANOCOBALAMIN) 1000 MCG tablet Take 1,000 mcg by mouth daily.     omeprazole (PRILOSEC) 20 MG capsule Take 1 capsule by mouth once daily 90 capsule 0   No facility-administered medications prior to visit.    Allergies  Allergen Reactions   Other     Dissolvable sutures cause "reaction"   Percocet [Oxycodone-Acetaminophen] Nausea Only    Review of Systems  Constitutional:  Positive for fatigue. Negative for chills and fever.  HENT:  Positive for congestion and sneezing. Negative for postnasal drip and sore throat.   Respiratory:  Positive for cough. Negative for shortness of breath.   Cardiovascular:  Negative for chest pain.  Gastrointestinal:  Negative for diarrhea, nausea and vomiting.      Objective:    Physical Exam Constitutional:      Appearance: Normal appearance.  HENT:     Head:      Right Ear: Ear canal and external ear normal. There is no impacted cerumen.     Left Ear: Ear canal and external ear normal. There is no impacted cerumen.     Ears:     Comments: Fluid behind bilateral TMs    Mouth/Throat:     Mouth: Mucous membranes are moist.  Eyes:     Extraocular Movements: Extraocular movements intact.     Pupils: Pupils are equal, round, and reactive to light.  Cardiovascular:     Rate and Rhythm: Normal rate and regular rhythm.  Pulmonary:     Effort: Pulmonary effort is normal.  Abdominal:     General: Bowel sounds are normal.  Lymphadenopathy:     Cervical: Cervical adenopathy present.    BP 128/72   Pulse 79   Temp 98.4 F (36.9 C)   Resp 14   Ht 5\' 4"  (1.626 m)   Wt 168 lb (76.2 kg)   SpO2 97%    BMI 28.84 kg/m  Wt Readings from Last 3 Encounters:  05/13/21 168 lb (76.2 kg)  02/21/21 160 lb 0.9 oz (72.6 kg)  01/22/21 162 lb 7 oz (73.7 kg)    Health Maintenance Due  Topic Date Due   Pneumococcal Vaccine 66-34 Years old (1 - PCV) Never done  HIV Screening  Never done   Hepatitis C Screening  Never done   Zoster Vaccines- Shingrix (1 of 2) Never done   PAP SMEAR-Modifier  09/11/2019   MAMMOGRAM  09/18/2020    There are no preventive care reminders to display for this patient.   Lab Results  Component Value Date   TSH 2.99 12/30/2020   Lab Results  Component Value Date   WBC 16.1 (H) 02/21/2021   HGB 14.0 02/21/2021   HCT 40.4 02/21/2021   MCV 90.0 02/21/2021   PLT 349 02/21/2021   Lab Results  Component Value Date   NA 139 02/21/2021   K 3.6 02/21/2021   CO2 26 02/21/2021   GLUCOSE 149 (H) 02/21/2021   BUN 21 (H) 02/21/2021   CREATININE 0.83 02/21/2021   BILITOT 0.7 02/21/2021   ALKPHOS 76 02/21/2021   AST 22 02/21/2021   ALT 28 02/21/2021   PROT 7.9 02/21/2021   ALBUMIN 4.4 02/21/2021   CALCIUM 9.7 02/21/2021   ANIONGAP 10 02/21/2021   GFR 72.35 03/29/2020   Lab Results  Component Value Date   CHOL 226 (H) 05/24/2020   Lab Results  Component Value Date   HDL 41.10 05/24/2020   Lab Results  Component Value Date   LDLCALC 156 (H) 05/24/2020   Lab Results  Component Value Date   TRIG 141.0 05/24/2020   Lab Results  Component Value Date   CHOLHDL 5 05/24/2020   Lab Results  Component Value Date   HGBA1C 6.1 03/29/2020       Assessment & Plan:   Problem List Items Addressed This Visit       Nervous and Auditory   Eustachian tube dysfunction, bilateral - Primary    Did have fluid behind bilateral ears.  Patient has Flonase we will give IM injection of Depo-Medrol in office.  Continue to monitor continue taking over-the-counter regimens such as antihistamine and Flonase        Other   Productive cough    Patient states she  feels like she cannot get the mucus up.  We will write her 1200 mg guaifenesin twice daily for 1 week to 8 and then secretions and getting out of her chest.  Symptoms have been improving since video visit continue to monitor.      Relevant Medications   Guaifenesin 1200 MG TB12   PND (post-nasal drip)    Continue taking over-the-counter antihistamine and Flonase as beneficial.  Drink plenty of fluid.  Continue to monitor       Patient is a scheduled follow-up CPE and labs with me or other NP beginning of year.  No orders of the defined types were placed in this encounter.  This visit occurred during the SARS-CoV-2 public health emergency.  Safety protocols were in place, including screening questions prior to the visit, additional usage of staff PPE, and extensive cleaning of exam room while observing appropriate contact time as indicated for disinfecting solutions.   Romilda Garret, NP

## 2021-05-13 NOTE — Assessment & Plan Note (Signed)
Continue taking over-the-counter antihistamine and Flonase as beneficial.  Drink plenty of fluid.  Continue to monitor

## 2021-05-13 NOTE — Assessment & Plan Note (Signed)
Did have fluid behind bilateral ears.  Patient has Flonase we will give IM injection of Depo-Medrol in office.  Continue to monitor continue taking over-the-counter regimens such as antihistamine and Flonase

## 2021-05-13 NOTE — Assessment & Plan Note (Signed)
Patient states she feels like she cannot get the mucus up.  We will write her 1200 mg guaifenesin twice daily for 1 week to 8 and then secretions and getting out of her chest.  Symptoms have been improving since video visit continue to monitor.

## 2021-06-18 ENCOUNTER — Other Ambulatory Visit: Payer: Self-pay | Admitting: Family

## 2021-06-18 DIAGNOSIS — K219 Gastro-esophageal reflux disease without esophagitis: Secondary | ICD-10-CM

## 2021-06-26 ENCOUNTER — Other Ambulatory Visit: Payer: Self-pay

## 2021-06-26 ENCOUNTER — Encounter: Payer: Self-pay | Admitting: Dermatology

## 2021-06-26 ENCOUNTER — Ambulatory Visit (INDEPENDENT_AMBULATORY_CARE_PROVIDER_SITE_OTHER): Payer: Self-pay | Admitting: Dermatology

## 2021-06-26 DIAGNOSIS — D492 Neoplasm of unspecified behavior of bone, soft tissue, and skin: Secondary | ICD-10-CM

## 2021-06-26 DIAGNOSIS — C44619 Basal cell carcinoma of skin of left upper limb, including shoulder: Secondary | ICD-10-CM

## 2021-06-26 DIAGNOSIS — C4491 Basal cell carcinoma of skin, unspecified: Secondary | ICD-10-CM

## 2021-06-26 HISTORY — DX: Basal cell carcinoma of skin, unspecified: C44.91

## 2021-06-26 NOTE — Patient Instructions (Addendum)

## 2021-06-26 NOTE — Progress Notes (Signed)
   Follow-Up Visit   Subjective  Judy Huang is a 55 y.o. female who presents for the following: check spot (L shoulder, ~6-8 months, no symptoms).  The patient has spots, moles and lesions to be evaluated, some may be new or changing and the patient has concerns that these could be cancer.  The following portions of the chart were reviewed this encounter and updated as appropriate:   Tobacco  Allergies  Meds  Problems  Med Hx  Surg Hx  Fam Hx     Review of Systems:  No other skin or systemic complaints except as noted in HPI or Assessment and Plan.  Objective  Well appearing patient in no apparent distress; mood and affect are within normal limits.  A focused examination was performed including left shoulder. Relevant physical exam findings are noted in the Assessment and Plan.  L shoulder top of deltoid Crusted plaque ~3.1 x 2.5cm        Assessment & Plan  Neoplasm of skin L shoulder top of deltoid  Skin / nail biopsy Type of biopsy: tangential   Informed consent: discussed and consent obtained   Timeout: patient name, date of birth, surgical site, and procedure verified   Procedure prep:  Patient was prepped and draped in usual sterile fashion Prep type:  Isopropyl alcohol Anesthesia: the lesion was anesthetized in a standard fashion   Anesthetic:  1% lidocaine w/ epinephrine 1-100,000 buffered w/ 8.4% NaHCO3 Instrument used: flexible razor blade   Outcome: patient tolerated procedure well   Post-procedure details: sterile dressing applied and wound care instructions given   Dressing type: bandage and bacitracin    Specimen 1 - Surgical pathology Differential Diagnosis: D48.5 R/O BCC  Check Margins: No Crusted plaque ~3.1 x 2.5cm  Discussed if BCC may consider MOHs due to it being poorly defined  Return for Follow up pending bx results.  I, Othelia Pulling, RMA, am acting as scribe for Sarina Ser, MD . Documentation: I have reviewed the above  documentation for accuracy and completeness, and I agree with the above.  Sarina Ser, MD

## 2021-07-01 ENCOUNTER — Telehealth: Payer: Self-pay

## 2021-07-01 NOTE — Telephone Encounter (Signed)
Discuss biopsy results with pt, Pt would like to go for Mohs surgery for treatment of BCC. She is not a fan of radiation.   Who would you like me to refer her to for Mohs

## 2021-07-01 NOTE — Telephone Encounter (Signed)
-----   Message from Ralene Bathe, MD sent at 06/30/2021 11:47 AM EST ----- Diagnosis Skin , left shoulder top of deltoid BASAL CELL CARCINOMA, NODULAR PATTERN, BASE INVOLVED  Cancer - BCC Schedule MOHS  (radiation could be another option if does not want to do MOHS - may schedule appt to discuss if she would like)

## 2021-07-28 ENCOUNTER — Other Ambulatory Visit: Payer: Self-pay

## 2021-07-28 ENCOUNTER — Telehealth: Payer: Self-pay

## 2021-07-28 DIAGNOSIS — C44619 Basal cell carcinoma of skin of left upper limb, including shoulder: Secondary | ICD-10-CM

## 2021-07-28 NOTE — Telephone Encounter (Signed)
Referral faxed to [facility] for 2nd time

## 2021-08-07 ENCOUNTER — Other Ambulatory Visit: Payer: Self-pay | Admitting: Nurse Practitioner

## 2021-08-07 DIAGNOSIS — I10 Essential (primary) hypertension: Secondary | ICD-10-CM

## 2021-08-08 ENCOUNTER — Telehealth: Payer: Self-pay | Admitting: Nurse Practitioner

## 2021-08-08 NOTE — Telephone Encounter (Signed)
I have seen this patient acutely but she has not done a TOC with me or [name]. I will refill her HCTZ this time but needs to come in for a CPE per my last note

## 2021-08-08 NOTE — Telephone Encounter (Signed)
Left message for patient to call back to relay this information

## 2021-08-08 NOTE — Telephone Encounter (Signed)
Patient advised. Patient states she told Catalina Antigua she would like him to be her provider and I went ahead and scheduled TOC for 09/04/21

## 2021-08-15 ENCOUNTER — Telehealth: Payer: Self-pay

## 2021-08-15 NOTE — Telephone Encounter (Signed)
Looks like patient agreed to go To ED but do not see where she is there yet.   Patient Name: Judy Huang Gender: Female DOB: [birth year] Age: 56 Y 76 M 27 D Return Phone Number: [phone] (Primary) Address: City/ State/ Zip: [place]  [address] Client [facility] - Client Client Site [place] - Day Provider AA - PHYSICIAN, Verita Schneiders- MD Contact Type Call Who Is Calling Patient / Member / Family / Caregiver Call Type Triage / Clinical Relationship To Patient Self Return Phone Number [phone] (Primary) Chief Complaint Dizziness Reason for Call Symptomatic / Request for [place] states she has high blood pressure 158/95 and dizzy Additional Comment transferred from office Translation No Nurse Assessment Nurse: Roselie Awkward, RN, Heather Date/Time (Eastern Time): 08/15/2021 10:48:52 AM Confirm and document reason for call. If symptomatic, describe symptoms. ---Caller states for a couple days she has been feeling dizzy and BP has been high. Today BP is 158/95. Normal BP for caller is 130s/80s Does the patient have any new or worsening symptoms? ---Yes Will a triage be completed? ---Yes Related visit to physician within the last 2 weeks? ---No Does the PT have any chronic conditions? (i.e. diabetes, asthma, this includes High risk factors for pregnancy, etc.) ---Yes List chronic conditions. ---HTN, Is this a behavioral health or substance abuse call? ---No Guidelines Guideline Title Affirmed Question Affirmed Notes Nurse Date/Time (Eastern Time) Dizziness - Lightheadedness [1] MODERATE dizziness (e.g., interferes with normal activities) AND [2] has NOT been evaluated by physician for this (Exception: dizziness caused by heat exposure, sudden Amadeo Garnet 08/15/2021 10:51:11 AM PLEASE NOTE: All timestamps contained within this report are represented as [place] Standard  Time. CONFIDENTIALTY NOTICE: This fax transmission is intended only for the addressee. It contains information that is legally privileged, confidential or otherwise protected from use or disclosure. If you are not the intended recipient, you are strictly prohibited from reviewing, disclosing, copying using or disseminating any of this information or taking any action in reliance on or regarding this information. If you have received this fax in error, please notify us immediately by telephone so that we can arrange for its return to [name]. Phone: [phone], Toll-Free: [phone], Fax: [phone] Page: 2 of 2 Call Id: [id number] Guidelines Guideline Title Affirmed Question Affirmed Notes Nurse Date/Time Eilene Ghazi Time) standing, or poor fluid intake) Chest Pain Visible sweat on face or sweat dripping down face Amadeo Garnet 08/15/2021 10:57:47 AM Disp. Time Eilene Ghazi Time) Disposition Final User 08/15/2021 10:57:17 AM See PCP within 24 Hours Amadeo Garnet 08/15/2021 11:04:56 AM 911 Outcome Documentation Roselie Awkward, RN, Nira Conn Reason: Caller refusing 911. States she will have someone driver her to ED. 11/19/7739 11:00:38 AM Call EMS 911 Now Yes Roselie Awkward, RN, Soyla Murphy Disagree/Comply Disagree Caller Understands Yes PreDisposition Call Doctor Care Advice Given Per Guideline SEE PCP WITHIN 24 HOURS: * IF OFFICE WILL BE OPEN: You need to be examined within the next 24 hours. Call your doctor (or NP/PA) when the office opens and make an appointment. DRINK FLUIDS: * Drink several glasses of fruit juice, other clear fluids or water. * This will improve hydration and blood glucose. * If the weather is hot or you have a fever, make sure the fluids are cold. LIE DOWN AND REST: * Lie down with feet elevated for 1 hour. * This will improve circulation and increase blood flow to the brain. CALL BACK IF: * Passes out (faints) * You become  worse CARE ADVICE given per Dizziness (Adult)  guideline. CALL EMS 911 NOW: * Immediate medical attention is needed. You need to hang up and call 911 (or an ambulance). * Triager Discretion: I'll call you back in a few minutes to be sure you were able to reach them. CARE ADVICE given per Chest Pain (Adult) guideline. Comments User: Mauri Pole, RN Date/Time Eilene Ghazi Time): 08/15/2021 11:06:16 AM Office and backline called for update on 911 refusal, no answer. Caller stated she would go into ER. Referrals [facility]

## 2021-08-15 NOTE — Telephone Encounter (Signed)
Left message to follow up 

## 2021-08-15 NOTE — Telephone Encounter (Signed)
Do not see an encounter as of yet for ED. Can we find out her plans. Does she want to go to UC or can it wait until an office visit?

## 2021-08-18 NOTE — Telephone Encounter (Signed)
Left message to call back and sent mychart message to the patient

## 2021-08-18 NOTE — Telephone Encounter (Signed)
Talked to the patient

## 2021-08-18 NOTE — Telephone Encounter (Signed)
Please see response from patient via mychart

## 2021-08-19 ENCOUNTER — Other Ambulatory Visit: Payer: Self-pay

## 2021-08-19 ENCOUNTER — Encounter: Payer: Self-pay | Admitting: Nurse Practitioner

## 2021-08-19 ENCOUNTER — Ambulatory Visit (INDEPENDENT_AMBULATORY_CARE_PROVIDER_SITE_OTHER): Payer: 59 | Admitting: Nurse Practitioner

## 2021-08-19 VITALS — BP 120/82 | HR 81 | Temp 98.3°F | Resp 12 | Ht 64.0 in | Wt 173.0 lb

## 2021-08-19 DIAGNOSIS — E559 Vitamin D deficiency, unspecified: Secondary | ICD-10-CM

## 2021-08-19 DIAGNOSIS — E7800 Pure hypercholesterolemia, unspecified: Secondary | ICD-10-CM

## 2021-08-19 DIAGNOSIS — I10 Essential (primary) hypertension: Secondary | ICD-10-CM

## 2021-08-19 DIAGNOSIS — Z0000 Encounter for general adult medical examination without abnormal findings: Secondary | ICD-10-CM | POA: Insufficient documentation

## 2021-08-19 DIAGNOSIS — F32A Depression, unspecified: Secondary | ICD-10-CM

## 2021-08-19 DIAGNOSIS — F419 Anxiety disorder, unspecified: Secondary | ICD-10-CM

## 2021-08-19 DIAGNOSIS — Z7689 Persons encountering health services in other specified circumstances: Secondary | ICD-10-CM

## 2021-08-19 DIAGNOSIS — R7303 Prediabetes: Secondary | ICD-10-CM | POA: Diagnosis not present

## 2021-08-19 LAB — CBC
HCT: 42.1 % (ref 36.0–46.0)
Hemoglobin: 13.9 g/dL (ref 12.0–15.0)
MCHC: 33.1 g/dL (ref 30.0–36.0)
MCV: 90.7 fl (ref 78.0–100.0)
Platelets: 346 10*3/uL (ref 150.0–400.0)
RBC: 4.64 Mil/uL (ref 3.87–5.11)
RDW: 12.7 % (ref 11.5–15.5)
WBC: 7.6 10*3/uL (ref 4.0–10.5)

## 2021-08-19 LAB — LIPID PANEL
Cholesterol: 199 mg/dL (ref 0–200)
HDL: 40.2 mg/dL (ref >39.00)
LDL Cholesterol: 123 mg/dL — ABNORMAL HIGH (ref 0–99)
NonHDL: 159.26
Total CHOL/HDL Ratio: 5
Triglycerides: 182 mg/dL — ABNORMAL HIGH (ref 0.0–149.0)
VLDL: 36.4 mg/dL (ref 0.0–40.0)

## 2021-08-19 LAB — COMPREHENSIVE METABOLIC PANEL
ALT: 20 U/L (ref 0–35)
AST: 17 U/L (ref 0–37)
Albumin: 4.6 g/dL (ref 3.5–5.2)
Alkaline Phosphatase: 78 U/L (ref 39–117)
BUN: 15 mg/dL (ref 6–23)
CO2: 32 mEq/L (ref 19–32)
Calcium: 10.7 mg/dL — ABNORMAL HIGH (ref 8.4–10.5)
Chloride: 101 mEq/L (ref 96–112)
Creatinine, Ser: 0.9 mg/dL (ref 0.40–1.20)
GFR: 71.47 mL/min (ref >60.00)
Glucose, Bld: 91 mg/dL (ref 70–99)
Potassium: 4.7 mEq/L (ref 3.5–5.1)
Sodium: 139 mEq/L (ref 135–145)
Total Bilirubin: 0.3 mg/dL (ref 0.2–1.2)
Total Protein: 7.5 g/dL (ref 6.0–8.3)

## 2021-08-19 LAB — VITAMIN D 25 HYDROXY (VIT D DEFICIENCY, FRACTURES): VITD: 40.08 ng/mL (ref 30.00–100.00)

## 2021-08-19 LAB — HEMOGLOBIN A1C: Hgb A1c MFr Bld: 5.9 % (ref 4.6–6.5)

## 2021-08-19 MED ORDER — SERTRALINE HCL 25 MG PO TABS: ORAL_TABLET | ORAL | 0 refills | Status: DC

## 2021-08-19 NOTE — Assessment & Plan Note (Signed)
Currently on any medications.  Pending lab results work on lifestyle modifications.

## 2021-08-19 NOTE — Assessment & Plan Note (Signed)
History of the same.  Was on prescription strength vitamin D in the past states multivitamin has vitamin D in it.  Pending lab result today continue multivitamin

## 2021-08-19 NOTE — Assessment & Plan Note (Signed)
Patient currently maintained on high chlorothiazide 12.5 mg once daily.  States as of late she feels like her blood pressures been up as been checking it at home.  Did check it this morning it was above goal but this was prior to having her medication on board.  Blood pressure within normal limits in office.  Did discuss checking blood pressure 3 times weekly at varying times the day and in the evening and reporting back via MyChart.  She can record check it more often if she starts having any symptoms.  Currently under a lot of caregiver stress from work and taking care of her 27 year old father

## 2021-08-19 NOTE — Assessment & Plan Note (Signed)
Patient is to be on Lexapro in the past states did not have any benefit and took her self off.  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  We will start her with Zoloft 25 mg for 2 weeks if tolerates well bump up to 50 mg nightly.  No SI/HI/AVH.

## 2021-08-19 NOTE — Assessment & Plan Note (Signed)
Not currently on any medications pending lab results.  Work on lifestyle modifications

## 2021-08-19 NOTE — Assessment & Plan Note (Signed)
Limited review of EMR.  Did review personal, surgical, and family history with patient

## 2021-08-19 NOTE — Progress Notes (Signed)
Established Patient Office Visit  Subjective:  Patient ID: Judy Huang, female    DOB: 10/17/1965  Age: 56 y.o. MRN: [id number]  CC:  Chief Complaint  Patient presents with   Transfer of Care   Hypertension    Discuss medication adjustment. B/p has been elevated more when patient checks it. This morning was 158/95 per patient at home    HPI Judy Huang presents for [facility]  HTN: Does have a blood pressure cuff at home. States that it has been out of whack and has been checking it on occ. States that she is under a lot of stress at home and work. Checked her BP this morning before getting into the shower. Had not taking her BP medication. WNL at office today. States about every 1 - 1.5 she will get up and walk a few minutes.  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 Drinks lots of water.  Eats two meals daily and does snack. 2 cups of coffee in the morning  HLD: Maintained with diet currently.  Prediabetes: Maintained on diet currently  VItamin D def: Muti vitamin has vitamin d and was on script version in the past  Reflux: omeprazole daily for years. Does not have to avoid triggers. Tomato based is her trigger. No upper GI on file  Tobacco: smoke approx 20 years. Social smoker not everday smoker. Just weekend use  Mammo: 01/21/2021 Colonscopy: 2018 repeat in 2028 Pap Smear:: partial hysterectomy, left ovaries DEXA: never  Dr. Sarina Ser Dermatology. Sees him at least yearly  Past Medical History:  Diagnosis Date   Arthritis    Basal cell carcinoma 06/26/2021   left shoulder top of deltoid, schedule Mohs   Chicken pox    Depression    GERD (gastroesophageal reflux disease)    High blood pressure    High cholesterol    History of gastroesophageal reflux (GERD)    History of headache    History of migraine    History of UTI    Polyp of colon    PONV (postoperative nausea and vomiting)     Past Surgical History:  Procedure Laterality  Date   BREAST REDUCTION SURGERY  2000   CESAREAN SECTION  1990   COLONOSCOPY WITH PROPOFOL N/A 01/22/2017   Procedure: COLONOSCOPY WITH PROPOFOL;  Surgeon: [name], MD;  Location: [place];  Service: Endoscopy;  Laterality: N/A;   PARTIAL HYSTERECTOMY  1997   POLYPECTOMY  01/22/2017   Procedure: POLYPECTOMY;  Surgeon: [name], MD;  Location: [place];  Service: Endoscopy;;   REDUCTION MAMMAPLASTY Bilateral 2000   BILAT   XI ROBOTIC LAPAROSCOPIC ASSISTED APPENDECTOMY N/A 02/21/2021   Procedure: XI ROBOTIC LAPAROSCOPIC ASSISTED APPENDECTOMY;  Surgeon: Herbert Pun, MD;  Location: [facility];  Service: General;  Laterality: N/A;    Family History  Problem Relation Age of Onset   Arthritis Mother    Arthritis Father    Hyperlipidemia Father    Stroke Father    High blood pressure Father    Diabetes Father    Arthritis Maternal Grandfather    Cancer Maternal Grandfather    Early death Maternal Grandfather    Arthritis Maternal Grandmother    Stroke Maternal Grandmother    Birth defects Maternal Grandmother    High Cholesterol Maternal Grandmother    Cancer Paternal Grandmother    Alcohol abuse Paternal Grandmother    Heart attack Paternal Grandmother    Hearing loss Paternal Merchant navy officer  Breast cancer Neg Hx     Social History   Socioeconomic History   Marital status: Widowed    Spouse name: Not on file   Number of children: Not on file   Years of education: Not on file   Highest education level: Not on file  Occupational History   Not on file  Tobacco Use   Smoking status: Former    Types: Cigarettes    Start date: 07/26/2013    Quit date: 2016    Years since quitting: 7.0   Smokeless tobacco: Never  Substance and Sexual Activity   Alcohol use: No    Comment: rare   Drug use: No   Sexual activity: Not on file  Other Topics Concern   Not on file  Social History Narrative   Not on file   Social Determinants of Health    Financial Resource Strain: Not on file  Food Insecurity: Not on file  Transportation Needs: Not on file  Physical Activity: Not on file  Stress: Not on file  Social Connections: Not on file  Intimate Partner Violence: Not on file    Outpatient Medications Prior to Visit  Medication Sig Dispense Refill   albuterol (VENTOLIN HFA) 108 (90 Base) MCG/ACT inhaler Inhale 2 puffs into the lungs every 4 (four) hours as needed for wheezing or shortness of breath (cough, shortness of breath or wheezing.). 6.7 g 3   ascorbic acid (VITAMIN C) 500 MG tablet Take 500 mg by mouth daily.     aspirin EC 81 MG tablet Take 81 mg by mouth daily.     fluticasone (FLONASE) 50 MCG/ACT nasal spray Place 2 sprays into both nostrils daily. 16 g 6   hydrochlorothiazide (MICROZIDE) 12.5 MG capsule TAKE 1 CAPSULE BY MOUTH DAILY. 90 capsule 0   Multiple Vitamin (MULTIVITAMIN WITH MINERALS) TABS tablet Take 1 tablet by mouth daily.     omeprazole (PRILOSEC) 20 MG capsule Take 1 capsule by mouth once daily 90 capsule 0   vitamin B-12 (CYANOCOBALAMIN) 1000 MCG tablet Take 1,000 mcg by mouth daily.     Guaifenesin 1200 MG TB12 Take 1 tablet (1,200 mg total) by mouth in the morning and at bedtime. 14 tablet 0   No facility-administered medications prior to visit.    Allergies  Allergen Reactions   Other     Dissolvable sutures cause "reaction"   Percocet [Oxycodone-Acetaminophen] Nausea Only    ROS Review of Systems  Constitutional:  Positive for fatigue. Negative for chills and fever.  Respiratory:  Negative for cough and shortness of breath.   Cardiovascular:  Negative for chest pain and leg swelling.  Gastrointestinal:  Negative for constipation, diarrhea, nausea and vomiting.       Daily BM with  Genitourinary:  Negative for dysuria, frequency, hematuria, vaginal bleeding, vaginal discharge and vaginal pain.  Neurological:  Positive for headaches. Negative for dizziness, light-headedness and numbness.   Psychiatric/Behavioral:  Negative for hallucinations and suicidal ideas.      Objective:    Physical Exam Vitals and nursing note reviewed.  Constitutional:      Appearance: Normal appearance.  HENT:     Right Ear: Tympanic membrane, ear canal and external ear normal.     Left Ear: Tympanic membrane, ear canal and external ear normal.     Mouth/Throat:     Mouth: Mucous membranes are moist.     Pharynx: Oropharynx is clear.  Eyes:     Extraocular Movements: Extraocular movements intact.  Pupils: Pupils are equal, round, and reactive to light.     Comments: Wears corrective lenses   Cardiovascular:     Rate and Rhythm: Normal rate and regular rhythm.     Pulses: Normal pulses.     Heart sounds: Normal heart sounds.  Pulmonary:     Effort: Pulmonary effort is normal.     Breath sounds: Normal breath sounds.  Abdominal:     General: Bowel sounds are normal.  Musculoskeletal:     Right lower leg: No edema.     Left lower leg: No edema.  Lymphadenopathy:     Cervical: No cervical adenopathy.  Skin:    General: Skin is warm.  Neurological:     Mental Status: She is alert.     Deep Tendon Reflexes:     Reflex Scores:      Bicep reflexes are 2+ on the right side and 2+ on the left side.      Patellar reflexes are 2+ on the right side and 2+ on the left side.    Comments: Bilateral upper and lower extremity strength 5/5   Psychiatric:        Mood and Affect: Mood normal.        Behavior: Behavior normal.        Thought Content: Thought content normal.        Judgment: Judgment normal.    BP 120/82    Pulse 81    Temp 98.3 F (36.8 C)    Resp 12    Ht 5\' 4"  (1.626 m)    Wt 173 lb (78.5 kg)    SpO2 97%    BMI 29.70 kg/m  Wt Readings from Last 3 Encounters:  08/19/21 173 lb (78.5 kg)  05/13/21 168 lb (76.2 kg)  02/21/21 160 lb 0.9 oz (72.6 kg)     Health Maintenance Due  Topic Date Due   HIV Screening  Never done   Hepatitis C Screening  Never done   Zoster  Vaccines- Shingrix (1 of 2) Never done   PAP SMEAR-Modifier  09/11/2019    There are no preventive care reminders to display for this patient.  Lab Results  Component Value Date   TSH 2.99 12/30/2020   Lab Results  Component Value Date   WBC 16.1 (H) 02/21/2021   HGB 14.0 02/21/2021   HCT 40.4 02/21/2021   MCV 90.0 02/21/2021   PLT 349 02/21/2021   Lab Results  Component Value Date   NA 139 02/21/2021   K 3.6 02/21/2021   CO2 26 02/21/2021   GLUCOSE 149 (H) 02/21/2021   BUN 21 (H) 02/21/2021   CREATININE 0.83 02/21/2021   BILITOT 0.7 02/21/2021   ALKPHOS 76 02/21/2021   AST 22 02/21/2021   ALT 28 02/21/2021   PROT 7.9 02/21/2021   ALBUMIN 4.4 02/21/2021   CALCIUM 9.7 02/21/2021   ANIONGAP 10 02/21/2021   GFR 72.35 03/29/2020   Lab Results  Component Value Date   CHOL 226 (H) 05/24/2020   Lab Results  Component Value Date   HDL 41.10 05/24/2020   Lab Results  Component Value Date   LDLCALC 156 (H) 05/24/2020   Lab Results  Component Value Date   TRIG 141.0 05/24/2020   Lab Results  Component Value Date   CHOLHDL 5 05/24/2020   Lab Results  Component Value Date   HGBA1C 6.1 03/29/2020      Assessment & Plan:   Problem List Items Addressed This Visit  Cardiovascular and Mediastinum   Essential hypertension    Patient currently maintained on high chlorothiazide 12.5 mg once daily.  States as of late she feels like her blood pressures been up as been checking it at home.  Did check it this morning it was above goal but this was prior to having her medication on board.  Blood pressure within normal limits in office.  Did discuss checking blood pressure 3 times weekly at varying times the day and in the evening and reporting back via MyChart.  She can record check it more often if she starts having any symptoms.  Currently under a lot of caregiver stress from work and taking care of her 36 year old father      Relevant Orders   CBC    Comprehensive metabolic panel     Other   Anxiety and depression    Patient is to be on Lexapro in the past states did not have any benefit and took her self off.  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  We will start her with Zoloft 25 mg for 2 weeks if tolerates well bump up to 50 mg nightly.  No SI/HI/AVH.      Relevant Medications   sertraline (ZOLOFT) 25 MG tablet   HLD (hyperlipidemia)    Not currently on any medications pending lab results.  Work on lifestyle modifications      Relevant Orders   Lipid panel   Vitamin D deficiency    History of the same.  Was on prescription strength vitamin D in the past states multivitamin has vitamin D in it.  Pending lab result today continue multivitamin      Relevant Orders   VITAMIN D 25 Hydroxy (Vit-D Deficiency, Fractures)   Prediabetes - Primary    Currently on any medications.  Pending lab results work on lifestyle modifications.      Relevant Orders   Hemoglobin A1c   Lipid panel   Encounter to establish care with new doctor    Limited review of EMR.  Did review personal, surgical, and family history with patient       Meds ordered this encounter  Medications   sertraline (ZOLOFT) 25 MG tablet    Sig: Take 1 tablet (25 mg total) by mouth at bedtime for 15 days, THEN 2 tablets (50 mg total) at bedtime for 15 days.    Dispense:  45 tablet    Refill:  0    Order Specific Question:   Supervising Provider    Answer:   Loura Pardon A [[id number]]    Follow-up: Return in about 8 weeks (around 10/14/2021) for virtaul med recheck.   This visit occurred during the SARS-CoV-2 public health emergency.  Safety protocols were in place, including screening questions prior to the visit, additional usage of staff PPE, and extensive cleaning of exam room while observing appropriate contact time as indicated for disinfecting solutions.   Judy Garret, NP

## 2021-08-19 NOTE — Patient Instructions (Signed)
Nice to see you today Will set up a virtual visit for 8 weeks to see how the medication is doing for you I will be in touch with the lab results Schedule a follow visit for 6 months for your physical Check blood pressure 3 times a week at home at varying times. Write it down and send a couple weeks worth to me at a time via [employer]

## 2021-08-25 ENCOUNTER — Ambulatory Visit: Payer: 59 | Admitting: Dermatology

## 2021-08-25 ENCOUNTER — Other Ambulatory Visit: Payer: Self-pay

## 2021-08-25 DIAGNOSIS — C44519 Basal cell carcinoma of skin of other part of trunk: Secondary | ICD-10-CM

## 2021-08-25 DIAGNOSIS — C44719 Basal cell carcinoma of skin of left lower limb, including hip: Secondary | ICD-10-CM | POA: Diagnosis not present

## 2021-08-25 DIAGNOSIS — D229 Melanocytic nevi, unspecified: Secondary | ICD-10-CM | POA: Diagnosis not present

## 2021-08-25 DIAGNOSIS — L821 Other seborrheic keratosis: Secondary | ICD-10-CM

## 2021-08-25 DIAGNOSIS — L814 Other melanin hyperpigmentation: Secondary | ICD-10-CM

## 2021-08-25 DIAGNOSIS — L409 Psoriasis, unspecified: Secondary | ICD-10-CM | POA: Diagnosis not present

## 2021-08-25 DIAGNOSIS — C44619 Basal cell carcinoma of skin of left upper limb, including shoulder: Secondary | ICD-10-CM | POA: Diagnosis not present

## 2021-08-25 DIAGNOSIS — Z85828 Personal history of other malignant neoplasm of skin: Secondary | ICD-10-CM

## 2021-08-25 DIAGNOSIS — B078 Other viral warts: Secondary | ICD-10-CM

## 2021-08-25 DIAGNOSIS — L578 Other skin changes due to chronic exposure to nonionizing radiation: Secondary | ICD-10-CM | POA: Diagnosis not present

## 2021-08-25 DIAGNOSIS — D1800 Hemangioma unspecified site: Secondary | ICD-10-CM | POA: Diagnosis not present

## 2021-08-25 DIAGNOSIS — D2272 Melanocytic nevi of left lower limb, including hip: Secondary | ICD-10-CM | POA: Diagnosis not present

## 2021-08-25 DIAGNOSIS — D485 Neoplasm of uncertain behavior of skin: Secondary | ICD-10-CM

## 2021-08-25 DIAGNOSIS — C4441 Basal cell carcinoma of skin of scalp and neck: Secondary | ICD-10-CM | POA: Diagnosis not present

## 2021-08-25 DIAGNOSIS — Z1283 Encounter for screening for malignant neoplasm of skin: Secondary | ICD-10-CM

## 2021-08-25 MED ORDER — VTAMA 1 % EX CREA: 1.0000 "application " | TOPICAL_CREAM | Freq: Every day | CUTANEOUS | 3 refills | Status: DC

## 2021-08-25 NOTE — Progress Notes (Signed)
Follow-Up Visit   Subjective  Judy Huang is a 56 y.o. female who presents for the following: Annual Exam (TBSE today - BCC left shoulder top of deltoid - she is scheduled for [facility] 09/15/2021). The patient presents for Total-Body Skin Exam (TBSE) for skin cancer screening and mole check.  The patient has spots, moles and lesions to be evaluated, some may be new or changing and the patient has concerns that these could be cancer.  The following portions of the chart were reviewed this encounter and updated as appropriate:   Tobacco   Allergies   Meds   Problems   Med Hx   Surg Hx   Fam Hx      Review of Systems:  No other skin or systemic complaints except as noted in HPI or Assessment and Plan.  Objective  Well appearing patient in no apparent distress; mood and affect are within normal limits.  A full examination was performed including scalp, head, eyes, ears, nose, lips, neck, chest, axillae, abdomen, back, buttocks, bilateral upper extremities, bilateral lower extremities, hands, feet, fingers, toes, fingernails, and toenails. All findings within normal limits unless otherwise noted below.  Left shoulder top of deltoid Healing biopsy site  Left distal ant thigh 1.3 x 0.8 cm pink patch     Left Upper Back Paraspinal 1.2 cm pink patch     Right infrascapular 1.2 cm pink patch       Right mid back braline 1.2 cm crusted papule     Hands, feet Scaly patches  Right Palmar Middle 5th Finger Verrucous papules -- Discussed viral etiology and contagion.    Assessment & Plan   History of Basal Cell Carcinoma of the Skin - No evidence of recurrence today - Recommend regular full body skin exams - Recommend daily broad spectrum sunscreen SPF 30+ to sun-exposed areas, reapply every 2 hours as needed.  - Call if any new or changing lesions are noted between office visits  Lentigines - Scattered tan macules - Due to sun exposure - Benign-appearing, observe -  Recommend daily broad spectrum sunscreen SPF 30+ to sun-exposed areas, reapply every 2 hours as needed. - Call for any changes  Seborrheic Keratoses - Stuck-on, waxy, tan-brown papules and/or plaques  - Benign-appearing - Discussed benign etiology and prognosis. - Observe - Call for any changes  Melanocytic Nevi - Tan-brown and/or pink-flesh-colored symmetric macules and papules - Benign appearing on exam today - Observation - Call clinic for new or changing moles - Recommend daily use of broad spectrum spf 30+ sunscreen to sun-exposed areas.   Hemangiomas - Red papules - Discussed benign nature - Observe - Call for any changes  Actinic Damage - Chronic condition, secondary to cumulative UV/sun exposure - diffuse scaly erythematous macules with underlying dyspigmentation - Recommend daily broad spectrum sunscreen SPF 30+ to sun-exposed areas, reapply every 2 hours as needed.  - Staying in the shade or wearing long sleeves, sun glasses (UVA+UVB protection) and wide brim hats (4-inch brim around the entire circumference of the hat) are also recommended for sun protection.  - Call for new or changing lesions.  Skin cancer screening performed today.'  Basal cell carcinoma (BCC) of skin of left upper extremity including shoulder Left shoulder top of deltoid  She is scheduled for [facility] in [place].  Neoplasm of uncertain behavior of skin (5) Left distal ant thigh  Epidermal / dermal shaving  Lesion diameter (cm):  1.3 Informed consent: discussed and consent  obtained   Timeout: patient name, date of birth, surgical site, and procedure verified   Procedure prep:  Patient was prepped and draped in usual sterile fashion Prep type:  Isopropyl alcohol Anesthesia: the lesion was anesthetized in a standard fashion   Anesthetic:  1% lidocaine w/ epinephrine 1-100,000 buffered w/ 8.4% NaHCO3 Instrument used: flexible razor blade   Hemostasis achieved with:  pressure, aluminum chloride and electrodesiccation   Outcome: patient tolerated procedure well   Post-procedure details: sterile dressing applied and wound care instructions given   Dressing type: bandage and petrolatum    Destruction of lesion Complexity: extensive   Destruction method: electrodesiccation and curettage   Informed consent: discussed and consent obtained   Timeout:  patient name, date of birth, surgical site, and procedure verified Procedure prep:  Patient was prepped and draped in usual sterile fashion Prep type:  Isopropyl alcohol Anesthesia: the lesion was anesthetized in a standard fashion   Anesthetic:  1% lidocaine w/ epinephrine 1-100,000 buffered w/ 8.4% NaHCO3 Curettage performed in three different directions: Yes   Electrodesiccation performed over the curetted area: Yes   Lesion length (cm):  1.3 Lesion width (cm):  0.8 Margin per side (cm):  0.2 Final wound size (cm):  1.7 Hemostasis achieved with:  pressure and aluminum chloride Outcome: patient tolerated procedure well with no complications   Post-procedure details: sterile dressing applied and wound care instructions given   Dressing type: bandage and petrolatum    Specimen 1 - Surgical pathology Differential Diagnosis: BCC vs other Check Margins: No EDC today  Left Upper Back Paraspinal  Epidermal / dermal shaving  Lesion diameter (cm):  1.2 Informed consent: discussed and consent obtained   Timeout: patient name, date of birth, surgical site, and procedure verified   Procedure prep:  Patient was prepped and draped in usual sterile fashion Prep type:  Isopropyl alcohol Anesthesia: the lesion was anesthetized in a standard fashion   Anesthetic:  1% lidocaine w/ epinephrine 1-100,000 buffered w/ 8.4% NaHCO3 Instrument used: flexible razor blade   Hemostasis achieved with: pressure, aluminum chloride and electrodesiccation   Outcome: patient tolerated procedure well   Post-procedure details:  sterile dressing applied and wound care instructions given   Dressing type: bandage and petrolatum    Destruction of lesion Complexity: extensive   Destruction method: electrodesiccation and curettage   Informed consent: discussed and consent obtained   Timeout:  patient name, date of birth, surgical site, and procedure verified Procedure prep:  Patient was prepped and draped in usual sterile fashion Prep type:  Isopropyl alcohol Anesthesia: the lesion was anesthetized in a standard fashion   Anesthetic:  1% lidocaine w/ epinephrine 1-100,000 buffered w/ 8.4% NaHCO3 Curettage performed in three different directions: Yes   Electrodesiccation performed over the curetted area: Yes   Lesion length (cm):  1.2 Lesion width (cm):  1.2 Margin per side (cm):  0.2 Final wound size (cm):  1.6 Hemostasis achieved with:  pressure and aluminum chloride Outcome: patient tolerated procedure well with no complications   Post-procedure details: sterile dressing applied and wound care instructions given   Dressing type: bandage and petrolatum    Specimen 2 - Surgical pathology Differential Diagnosis: BCC vs other  Check Margins: No EDC today  Right infrascapular  Epidermal / dermal shaving  Lesion diameter (cm):  1.2 Informed consent: discussed and consent obtained   Timeout: patient name, date of birth, surgical site, and procedure verified   Procedure prep:  Patient was prepped and draped in usual sterile  fashion Prep type:  Isopropyl alcohol Anesthesia: the lesion was anesthetized in a standard fashion   Anesthetic:  1% lidocaine w/ epinephrine 1-100,000 buffered w/ 8.4% NaHCO3 Instrument used: flexible razor blade   Hemostasis achieved with: pressure, aluminum chloride and electrodesiccation   Outcome: patient tolerated procedure well   Post-procedure details: sterile dressing applied and wound care instructions given   Dressing type: bandage and petrolatum    Destruction of  lesion Complexity: extensive   Destruction method: electrodesiccation and curettage   Informed consent: discussed and consent obtained   Timeout:  patient name, date of birth, surgical site, and procedure verified Procedure prep:  Patient was prepped and draped in usual sterile fashion Prep type:  Isopropyl alcohol Anesthesia: the lesion was anesthetized in a standard fashion   Anesthetic:  1% lidocaine w/ epinephrine 1-100,000 buffered w/ 8.4% NaHCO3 Curettage performed in three different directions: Yes   Electrodesiccation performed over the curetted area: Yes   Lesion length (cm):  1.2 Lesion width (cm):  1.2 Margin per side (cm):  0.2 Final wound size (cm):  1.6 Hemostasis achieved with:  pressure and aluminum chloride Outcome: patient tolerated procedure well with no complications   Post-procedure details: sterile dressing applied and wound care instructions given   Dressing type: bandage and petrolatum    Specimen 3 - Surgical pathology Differential Diagnosis: BCC vs other  Check Margins: No EDC today  Right mid back braline  Epidermal / dermal shaving  Lesion diameter (cm):  1.2 Informed consent: discussed and consent obtained   Timeout: patient name, date of birth, surgical site, and procedure verified   Procedure prep:  Patient was prepped and draped in usual sterile fashion Prep type:  Isopropyl alcohol Anesthesia: the lesion was anesthetized in a standard fashion   Anesthetic:  1% lidocaine w/ epinephrine 1-100,000 buffered w/ 8.4% NaHCO3 Instrument used: flexible razor blade   Hemostasis achieved with: pressure, aluminum chloride and electrodesiccation   Outcome: patient tolerated procedure well   Post-procedure details: sterile dressing applied and wound care instructions given   Dressing type: bandage and petrolatum    Destruction of lesion Complexity: extensive   Destruction method: electrodesiccation and curettage   Informed consent: discussed and consent  obtained   Timeout:  patient name, date of birth, surgical site, and procedure verified Procedure prep:  Patient was prepped and draped in usual sterile fashion Prep type:  Isopropyl alcohol Anesthesia: the lesion was anesthetized in a standard fashion   Anesthetic:  1% lidocaine w/ epinephrine 1-100,000 buffered w/ 8.4% NaHCO3 Curettage performed in three different directions: Yes   Electrodesiccation performed over the curetted area: Yes   Lesion length (cm):  1.2 Lesion width (cm):  1.2 Margin per side (cm):  0.2 Final wound size (cm):  1.6 Hemostasis achieved with:  pressure and aluminum chloride Outcome: patient tolerated procedure well with no complications   Post-procedure details: sterile dressing applied and wound care instructions given   Dressing type: bandage and petrolatum    Specimen 4 - Surgical pathology Differential Diagnosis: BCC vs other  Check Margins: No EDC today  Left Abdomen (side) - Upper  Psoriasis Hands, feet  Psoriasis is a chronic non-curable, but treatable genetic/hereditary disease that may have other systemic features affecting other organ systems such as joints (Psoriatic Arthritis). It is associated with an increased risk of inflammatory bowel disease, heart disease, non-alcoholic fatty liver disease, and depression.    Start [name] cream qhs  Tapinarof ([name]) 1 % CREA - Hands, feet Apply 1  application topically at bedtime.  Other viral warts Right Palmar Middle 5th Finger  Destruction of lesion - Right Palmar Middle 5th Finger Complexity: simple   Destruction method: cryotherapy   Informed consent: discussed and consent obtained   Timeout:  patient name, date of birth, surgical site, and procedure verified Lesion destroyed using liquid nitrogen: Yes   Region frozen until ice ball extended beyond lesion: Yes   Outcome: patient tolerated procedure well with no complications   Post-procedure details: wound care instructions given    Skin  cancer screening  Follow-up in 3 months  I, Hollie Dean, CMA, am acting as scribe for Sarina Ser, MD . Documentation: I have reviewed the above documentation for accuracy and completeness, and I agree with the above.  Sarina Ser, MD

## 2021-08-25 NOTE — Patient Instructions (Addendum)
Cryotherapy Aftercare  Wash gently with soap and water everyday.   Apply Vaseline and Band-Aid daily until healed.    Wound Care Instructions  Cleanse wound gently with soap and water once a day then pat dry with clean gauze. Apply a thing coat of Petrolatum (petroleum jelly, "Vaseline") over the wound (unless you have an allergy to this). We recommend that you use a new, sterile tube of Vaseline. Do not pick or remove scabs. Do not remove the yellow or white "healing tissue" from the base of the wound.  Cover the wound with fresh, clean, nonstick gauze and secure with paper tape. You may use Band-Aids in place of gauze and tape if the would is small enough, but would recommend trimming much of the tape off as there is often too much. Sometimes Band-Aids can irritate the skin.  You should call the office for your biopsy report after 1 week if you have not already been contacted.  If you experience any problems, such as abnormal amounts of bleeding, swelling, significant bruising, significant pain, or evidence of infection, please call the office immediately.  FOR ADULT SURGERY PATIENTS: If you need something for pain relief you may take 1 extra strength Tylenol (acetaminophen) AND 2 Ibuprofen (200mg each) together every 4 hours as needed for pain. (do not take these if you are allergic to them or if you have a reason you should not take them.) Typically, you may only need pain medication for 1 to 3 days.        If You Need Anything After Your Visit  If you have any questions or concerns for your doctor, please call our main line at [phone] and press option 4 to reach your doctor's medical assistant. If no one answers, please leave a voicemail as directed and we will return your call as soon as possible. Messages left after 4 pm will be answered the following business day.   You may also send us a message via MyChart. We typically respond to MyChart messages within 1-2 business  days.  For prescription refills, please ask your pharmacy to contact our office. Our fax number is [phone].  If you have an urgent issue when the clinic is closed that cannot wait until the next business day, you can page your doctor at the number below.    Please note that while we do our best to be available for urgent issues outside of office hours, we are not available 24/7.   If you have an urgent issue and are unable to reach us, you may choose to seek medical care at your doctor's office, retail clinic, urgent care center, or emergency room.  If you have a medical emergency, please immediately call 911 or go to the emergency department.  Pager Numbers  - Dr. Kowalski: [phone]  - Dr. Moye: [phone]  - Dr. Stewart: [phone]  In the event of inclement weather, please call our main line at [phone] for an update on the status of any delays or closures.  Dermatology Medication Tips: Please keep the boxes that topical medications come in in order to help keep track of the instructions about where and how to use these. Pharmacies typically print the medication instructions only on the boxes and not directly on the medication tubes.   If your medication is too expensive, please contact our office at [phone] option 4 or send us a message through MyChart.   We are unable to tell what your co-pay for medications will be   in advance as this is different depending on your insurance coverage. However, we may be able to find a substitute medication at lower cost or fill out paperwork to get insurance to cover a needed medication.   If a prior authorization is required to get your medication covered by your insurance company, please allow us 1-2 business days to complete this process.  Drug prices often vary depending on where the prescription is filled and some pharmacies may offer cheaper prices.  The website www.goodrx.com contains coupons for medications through  different pharmacies. The prices here do not account for what the cost may be with help from insurance (it may be cheaper with your insurance), but the website can give you the price if you did not use any insurance.  - You can print the associated coupon and take it with your prescription to the pharmacy.  - You may also stop by our office during regular business hours and pick up a GoodRx coupon card.  - If you need your prescription sent electronically to a different pharmacy, notify our office through  MyChart or by phone at [phone] option 4.     Si Usted Necesita Algo Despus de Su Visita  Tambin puede enviarnos un mensaje a travs de MyChart. Por lo general respondemos a los mensajes de MyChart en el transcurso de 1 a 2 das hbiles.  Para renovar recetas, por favor pida a su farmacia que se ponga en contacto con nuestra oficina. Nuestro nmero de fax es el [phone].  Si tiene un asunto urgente cuando la clnica est cerrada y que no puede esperar hasta el siguiente da hbil, puede llamar/localizar a su doctor(a) al nmero que aparece a continuacin.   Por favor, tenga en cuenta que aunque hacemos todo lo posible para estar disponibles para asuntos urgentes fuera del horario de oficina, no estamos disponibles las 24 horas del da, los 7 das de la semana.   Si tiene un problema urgente y no puede comunicarse con nosotros, puede optar por buscar atencin mdica  en el consultorio de su doctor(a), en una clnica privada, en un centro de atencin urgente o en una sala de emergencias.  Si tiene una emergencia mdica, por favor llame inmediatamente al 911 o vaya a la sala de emergencias.  Nmeros de bper  - Dr. Kowalski: [phone]  - Dra. Moye: [phone]  - Dra. Stewart: [phone]  En caso de inclemencias del tiempo, por favor llame a nuestra lnea principal al [phone] para una actualizacin sobre el estado de cualquier retraso o cierre.  Consejos  para la medicacin en dermatologa: Por favor, guarde las cajas en las que vienen los medicamentos de uso tpico para ayudarle a seguir las instrucciones sobre dnde y cmo usarlos. Las farmacias generalmente imprimen las instrucciones del medicamento slo en las cajas y no directamente en los tubos del medicamento.   Si su medicamento es muy caro, por favor, pngase en contacto con nuestra oficina llamando al [phone] y presione la opcin 4 o envenos un mensaje a travs de MyChart.   No podemos decirle cul ser su copago por los medicamentos por adelantado ya que esto es diferente dependiendo de la cobertura de su seguro. Sin embargo, es posible que podamos encontrar un medicamento sustituto a menor costo o llenar un formulario para que el seguro cubra el medicamento que se considera necesario.   Si se requiere una autorizacin previa para que su compaa de seguros cubra su medicamento, por favor permtanos de 1 a   2 das hbiles para completar este proceso.  Los precios de los medicamentos varan con frecuencia dependiendo del lugar de dnde se surte la receta y alguna farmacias pueden ofrecer precios ms baratos.  El sitio web www.goodrx.com tiene cupones para medicamentos de diferentes farmacias. Los precios aqu no tienen en cuenta lo que podra costar con la ayuda del seguro (puede ser ms barato con su seguro), pero el sitio web puede darle el precio si no utiliz ningn seguro.  - Puede imprimir el cupn correspondiente y llevarlo con su receta a la farmacia.  - Tambin puede pasar por nuestra oficina durante el horario de atencin regular y recoger una tarjeta de cupones de GoodRx.  - Si necesita que su receta se enve electrnicamente a una farmacia diferente, informe a nuestra oficina a travs de MyChart de [place] o por telfono llamando al [phone] y presione la opcin 4.  

## 2021-08-26 ENCOUNTER — Telehealth: Payer: Self-pay

## 2021-08-26 ENCOUNTER — Encounter: Payer: Self-pay | Admitting: Dermatology

## 2021-08-26 NOTE — Telephone Encounter (Signed)
-----   Message from Ralene Bathe, MD sent at 08/26/2021  4:27 PM EST ----- Diagnosis 1. 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 Skin , left upper back paraspinal SUPERFICIAL BASAL CELL CARCINOMA 3. Skin , right infrascapular SUPERFICIAL AND NODULAR BASAL CELL CARCINOMA 4. Skin , right mid back braline BASAL CELL CARCINOMA, NODULAR PATTERN, ULCERATED  1,2,3,4 - all four showed Cancer = BCC All 4 already treated Recheck next visit - keep fu appt

## 2021-08-26 NOTE — Telephone Encounter (Signed)
Left message for patient to call office for results/hd 

## 2021-08-27 ENCOUNTER — Telehealth: Payer: Self-pay

## 2021-08-27 NOTE — Telephone Encounter (Signed)
Patient advised of BX results.  °

## 2021-08-27 NOTE — Telephone Encounter (Signed)
-----   Message from Ralene Bathe, MD sent at 08/26/2021  4:27 PM EST ----- Diagnosis 1. 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 Skin , left upper back paraspinal SUPERFICIAL BASAL CELL CARCINOMA 3. Skin , right infrascapular SUPERFICIAL AND NODULAR BASAL CELL CARCINOMA 4. Skin , right mid back braline BASAL CELL CARCINOMA, NODULAR PATTERN, ULCERATED  1,2,3,4 - all four showed Cancer = BCC All 4 already treated Recheck next visit - keep fu appt

## 2021-08-28 ENCOUNTER — Other Ambulatory Visit: Payer: Self-pay | Admitting: Family

## 2021-08-28 DIAGNOSIS — K219 Gastro-esophageal reflux disease without esophagitis: Secondary | ICD-10-CM

## 2021-09-04 ENCOUNTER — Encounter: Payer: Self-pay | Admitting: Nurse Practitioner

## 2021-09-11 MED ORDER — OMEPRAZOLE 20 MG PO CPDR: 20.0000 mg | DELAYED_RELEASE_CAPSULE | Freq: Every day | ORAL | 1 refills | Status: DC

## 2021-09-11 NOTE — Addendum Note (Signed)
Addended by: Kris Mouton on: 09/11/2021 01:17 PM   Modules accepted: Orders

## 2021-09-11 NOTE — Telephone Encounter (Signed)
°  Encourage patient to contact the pharmacy for refills or they can request refills through [place]:  Please schedule appointment if longer than 1 year  NEXT APPOINTMENT DATE:  MEDICATION: omeprazole (PRILOSEC) 20 MG capsule  Is the patient out of medication? yes  PHARMACY: [facility]- [address]   Let patient know to contact pharmacy at the end of the day to make sure medication is ready.  Please notify patient to allow 48-72 hours to process  CLINICAL FILLS OUT ALL BELOW:   LAST REFILL:  QTY:  REFILL DATE:    OTHER COMMENTS:    Okay for refill?  Please advise

## 2021-09-15 DIAGNOSIS — Z481 Encounter for planned postprocedural wound closure: Secondary | ICD-10-CM | POA: Diagnosis not present

## 2021-09-15 DIAGNOSIS — C4491 Basal cell carcinoma of skin, unspecified: Secondary | ICD-10-CM | POA: Diagnosis not present

## 2021-09-15 DIAGNOSIS — C44619 Basal cell carcinoma of skin of left upper limb, including shoulder: Secondary | ICD-10-CM | POA: Diagnosis not present

## 2021-09-25 ENCOUNTER — Telehealth: Payer: Self-pay

## 2021-09-25 ENCOUNTER — Encounter: Payer: Self-pay | Admitting: Nurse Practitioner

## 2021-09-25 MED ORDER — ZORYVE 0.3 % EX CREA: 1.0000 "application " | TOPICAL_CREAM | Freq: Every evening | CUTANEOUS | 1 refills | Status: DC

## 2021-09-25 NOTE — Telephone Encounter (Signed)
RX sent in and patient advised of medication change. aw ?

## 2021-09-25 NOTE — Telephone Encounter (Signed)
Spoke with patient regarding [name] cream. She has had no improvement or relief since starting medication. She would like to try something different.  ?

## 2021-09-29 ENCOUNTER — Ambulatory Visit (INDEPENDENT_AMBULATORY_CARE_PROVIDER_SITE_OTHER): Payer: 59 | Admitting: Nurse Practitioner

## 2021-09-29 ENCOUNTER — Encounter: Payer: Self-pay | Admitting: Nurse Practitioner

## 2021-09-29 ENCOUNTER — Other Ambulatory Visit: Payer: Self-pay

## 2021-09-29 VITALS — BP 128/84 | HR 87 | Temp 97.7°F | Resp 14 | Ht 64.0 in | Wt 177.4 lb

## 2021-09-29 DIAGNOSIS — I10 Essential (primary) hypertension: Secondary | ICD-10-CM | POA: Diagnosis not present

## 2021-09-29 DIAGNOSIS — R69 Illness, unspecified: Secondary | ICD-10-CM | POA: Diagnosis not present

## 2021-09-29 DIAGNOSIS — F32A Depression, unspecified: Secondary | ICD-10-CM

## 2021-09-29 DIAGNOSIS — J0140 Acute pansinusitis, unspecified: Secondary | ICD-10-CM | POA: Insufficient documentation

## 2021-09-29 DIAGNOSIS — F419 Anxiety disorder, unspecified: Secondary | ICD-10-CM

## 2021-09-29 MED ORDER — BUSPIRONE HCL 5 MG PO TABS: 5.0000 mg | ORAL_TABLET | Freq: Two times a day (BID) | ORAL | 0 refills | Status: DC

## 2021-09-29 MED ORDER — FLUOXETINE HCL 10 MG PO TABS
10.0000 mg | ORAL_TABLET | Freq: Every day | ORAL | 0 refills | Status: DC
Start: 2021-09-29 — End: 2022-01-06

## 2021-09-29 MED ORDER — DOXYCYCLINE HYCLATE 100 MG PO TABS: 100.0000 mg | ORAL_TABLET | Freq: Two times a day (BID) | ORAL | 0 refills | Status: DC

## 2021-09-29 MED ORDER — PREDNISONE 20 MG PO TABS: ORAL_TABLET | ORAL | 0 refills | Status: AC

## 2021-09-29 NOTE — Assessment & Plan Note (Signed)
Patient's physical exam and length of symptoms indicative of pansinusitis.  We will put patient on doxycycline 100 mg twice daily for 7 days.  Patient says she does not respond well to amoxicillin.  Also risk of steroid for pressure in the face.  Did instruct to avoid NSAIDs while on prednisone medication.  Follow-up if no improvement or symptoms worsen. ?

## 2021-09-29 NOTE — Patient Instructions (Signed)
Nice to see you today ?We will STOP the Zoloft (sertraline) and start the Prozac. We will do '10mg'$  for 2 weeks and if tolerating well step up to '20mg'$  after that ?Follow up with me in 8 weeks, sooner if needed ?

## 2021-09-29 NOTE — Assessment & Plan Note (Signed)
Patient has been on escitalopram in the past along with citalopram.  These were ineffective.  Recently started patient on sertraline 25 mg to titrate up to 50 mg.  Patient was having no effect at 25 mg which she went to titrate up she felt like the medication was too much for her.  We will stop sertraline and put her on fluoxetine 10 mg daily.  If she tolerates this for a week or 2 we can titrate up to 20 mg daily.  We will also put her on some BuSpar 5 mg twice daily for more immediate effect with her anxiety.  Patient to follow-up in 8 weeks, sooner if needed.  Did safely cannot figure out an appropriate medication regimen for we will send her to psychiatry.  Wellbutrin is always an option for patient but since she has so much anxiety we will defer using that medication currently.  No HI/SI/AVH. ?

## 2021-09-29 NOTE — Progress Notes (Signed)
Established Patient Office Visit  Subjective:  Patient ID: Judy Huang, female    DOB: 1966-01-02  Age: 56 y.o. MRN: [id number]  CC:  Chief Complaint  Patient presents with   Hypertension    Follow up-elevated at home   Anxiety    Worse, medication not working as well now   Cough    About 1 week, coughing up yellow phlegm, chest congestion, sinus pressure. Would like to discuss getting prednisone to help    HPI DELBRA ZELLARS presents for   Elevated blood pressure: States she has been checking her blood pressure. She did bring her cuff in for a comparsion. When she checked it here  it was 140s/90s and when we checked it was with in normal limits. States that she does see black spots on occasion. She has not had her eyes checked lately. Encouraged opth follow up   Anxiety and depression: was started on zoloft '25mg'$ . States that she did try to titrate up and it felt like too much medication. States that she feels reclusive. No HI/SI/AVH  Cough: Symptoms started approx 1 week ago. States that allergies, cough, congestion, and cough. Has been using the claritin, flonase, inhaler, with some relief. States it is white and yellow tint. Has not covid tested at home.   PHQ9 SCORE ONLY 09/29/2021 08/19/2021 05/31/2020  PHQ-9 Total Score '24 18 11    '$ GAD 7 : Generalized Anxiety Score 09/29/2021 08/19/2021 05/31/2020 03/29/2020  Nervous, Anxious, on Edge '3 3 3 3  '$ Control/stop worrying '3 3 3 3  '$ Worry too much - different things '3 3 3 3  '$ Trouble relaxing '3 3 3 3  '$ Restless '3 3 3 1  '$ Easily annoyed or irritable '3 3 3 3  '$ Afraid - awful might happen '3 3 3 3  '$ Total GAD 7 Score '21 21 21 19  '$ Anxiety Difficulty Extremely difficult - - -      Past Medical History:  Diagnosis Date   Arthritis    Basal cell carcinoma 06/26/2021   left shoulder top of deltoid, schedule Mohs   Basal cell carcinoma 08/25/2021   Left distal ant thigh - [facility]   Basal cell carcinoma 08/25/2021   Left upper back  paraspinal - [facility]   Basal cell carcinoma 08/25/2021   Right infrascapular - [facility]   Basal cell carcinoma 08/25/2021   Right mid back braline -[facility]C   Chicken pox    Depression    GERD (gastroesophageal reflux disease)    High blood pressure    High cholesterol    History of gastroesophageal reflux (GERD)    History of headache    History of migraine    History of UTI    Polyp of colon    PONV (postoperative nausea and vomiting)     Past Surgical History:  Procedure Laterality Date   BREAST REDUCTION SURGERY  2000   CESAREAN SECTION  1990   COLONOSCOPY WITH PROPOFOL N/A 01/22/2017   Procedure: COLONOSCOPY WITH PROPOFOL;  Surgeon: [name], MD;  Location: [place];  Service: Endoscopy;  Laterality: N/A;   PARTIAL HYSTERECTOMY  1997   POLYPECTOMY  01/22/2017   Procedure: POLYPECTOMY;  Surgeon: [name], MD;  Location: [place];  Service: Endoscopy;;   REDUCTION MAMMAPLASTY Bilateral 2000   BILAT   XI ROBOTIC LAPAROSCOPIC ASSISTED APPENDECTOMY N/A 02/21/2021   Procedure: XI ROBOTIC LAPAROSCOPIC ASSISTED APPENDECTOMY;  Surgeon: Herbert Pun, MD;  Location: [facility];  Service: General;  Laterality: N/A;  Family History  Problem Relation Age of Onset   Arthritis Mother    Peripheral Artery Disease Mother    Arthritis Father    Hyperlipidemia Father    Stroke Father    High blood pressure Father    Diabetes Father    Protein S deficiency Father    Protein S deficiency Sister    Hodgkin's lymphoma Maternal Uncle    Early death Maternal Uncle 52   Cancer Maternal Grandmother        intestinal   Arthritis Maternal Grandmother    Stroke Maternal Grandmother    Birth defects Maternal Grandmother    High Cholesterol Maternal Grandmother    Arthritis Maternal Grandfather    Cancer Maternal Grandfather    Early death Maternal Grandfather    Emphysema Maternal Grandfather    Cancer Paternal Grandmother    Alcohol abuse Paternal Grandmother     Heart attack Paternal Grandmother    Hearing loss Paternal Grandfather    Breast cancer Neg Hx     Social History   Socioeconomic History   Marital status: Widowed    Spouse name: Not on file   Number of children: Not on file   Years of education: Not on file   Highest education level: Not on file  Occupational History   Not on file  Tobacco Use   Smoking status: Former    Types: Cigarettes    Start date: 07/26/2013    Quit date: 2016    Years since quitting: 7.2   Smokeless tobacco: Never  Vaping Use   Vaping Use: Never used  Substance and Sexual Activity   Alcohol use: No    Comment: rare   Drug use: No   Sexual activity: Not on file  Other Topics Concern   Not on file  Social History Narrative   Not on file   Social Determinants of Health   Financial Resource Strain: Not on file  Food Insecurity: Not on file  Transportation Needs: Not on file  Physical Activity: Not on file  Stress: Not on file  Social Connections: Not on file  Intimate Partner Violence: Not on file    Outpatient Medications Prior to Visit  Medication Sig Dispense Refill   albuterol (VENTOLIN HFA) 108 (90 Base) MCG/ACT inhaler Inhale 2 puffs into the lungs every 4 (four) hours as needed for wheezing or shortness of breath (cough, shortness of breath or wheezing.). 6.7 g 3   ascorbic acid (VITAMIN C) 500 MG tablet Take 500 mg by mouth daily.     aspirin EC 81 MG tablet Take 81 mg by mouth daily.     fluticasone (FLONASE) 50 MCG/ACT nasal spray Place 2 sprays into both nostrils daily. 16 g 6   hydrochlorothiazide (MICROZIDE) 12.5 MG capsule TAKE 1 CAPSULE BY MOUTH DAILY. 90 capsule 0   Multiple Vitamin (MULTIVITAMIN WITH MINERALS) TABS tablet Take 1 tablet by mouth daily.     omeprazole (PRILOSEC) 20 MG capsule Take 1 capsule (20 mg total) by mouth daily. 90 capsule 1   Roflumilast ([name]) 0.3 % CREA Apply 1 application. topically at bedtime. To feet and hands 60 g 1   Tapinarof ([name]) 1 % CREA  Apply 1 application topically at bedtime. 60 g 3   vitamin B-12 (CYANOCOBALAMIN) 1000 MCG tablet Take 1,000 mcg by mouth daily.     sertraline (ZOLOFT) 25 MG tablet Take 1 tablet (25 mg total) by mouth at bedtime for 15 days, THEN 2 tablets (50 mg total)  at bedtime for 15 days. 45 tablet 0   No facility-administered medications prior to visit.    Allergies  Allergen Reactions   Other     Dissolvable sutures cause "reaction"   Percocet [Oxycodone-Acetaminophen] Nausea Only    ROS Review of Systems  Constitutional:  Positive for fatigue. Negative for chills and fever.  HENT:  Positive for congestion, ear pain (fullness), postnasal drip, sinus pressure and sinus pain. Negative for ear discharge and sore throat.   Respiratory:  Positive for cough and wheezing. Negative for chest tightness and shortness of breath.   Cardiovascular:  Negative for chest pain.  Gastrointestinal:  Negative for diarrhea, nausea and vomiting.  Musculoskeletal:  Negative for arthralgias and myalgias.  Neurological:  Positive for headaches.     Objective:    Physical Exam Constitutional:      Appearance: Normal appearance.  HENT:     Right Ear: Tympanic membrane, ear canal and external ear normal.     Left Ear: Tympanic membrane, ear canal and external ear normal.     Nose:     Right Sinus: Maxillary sinus tenderness and frontal sinus tenderness present.     Left Sinus: Maxillary sinus tenderness and frontal sinus tenderness present.     Mouth/Throat:     Mouth: Mucous membranes are moist.     Pharynx: Oropharynx is clear.  Cardiovascular:     Rate and Rhythm: Normal rate and regular rhythm.     Heart sounds: Normal heart sounds.  Pulmonary:     Effort: Pulmonary effort is normal.     Breath sounds: Normal breath sounds.  Lymphadenopathy:     Cervical: No cervical adenopathy.  Neurological:     Mental Status: She is alert.    BP 128/84    Pulse 87    Temp 97.7 F (36.5 C)    Resp 14    Ht '5\' 4"'$   (1.626 m)    Wt 177 lb 6 oz (80.5 kg)    SpO2 97%    BMI 30.45 kg/m  Wt Readings from Last 3 Encounters:  09/29/21 177 lb 6 oz (80.5 kg)  08/19/21 173 lb (78.5 kg)  05/13/21 168 lb (76.2 kg)     Health Maintenance Due  Topic Date Due   HIV Screening  Never done   Hepatitis C Screening  Never done   Zoster Vaccines- Shingrix (1 of 2) Never done   PAP SMEAR-Modifier  09/11/2019    There are no preventive care reminders to display for this patient.  Lab Results  Component Value Date   TSH 2.99 12/30/2020   Lab Results  Component Value Date   WBC 7.6 08/19/2021   HGB 13.9 08/19/2021   HCT 42.1 08/19/2021   MCV 90.7 08/19/2021   PLT 346.0 08/19/2021   Lab Results  Component Value Date   NA 139 08/19/2021   K 4.7 08/19/2021   CO2 32 08/19/2021   GLUCOSE 91 08/19/2021   BUN 15 08/19/2021   CREATININE 0.90 08/19/2021   BILITOT 0.3 08/19/2021   ALKPHOS 78 08/19/2021   AST 17 08/19/2021   ALT 20 08/19/2021   PROT 7.5 08/19/2021   ALBUMIN 4.6 08/19/2021   CALCIUM 10.7 (H) 08/19/2021   ANIONGAP 10 02/21/2021   GFR 71.47 08/19/2021   Lab Results  Component Value Date   CHOL 199 08/19/2021   Lab Results  Component Value Date   HDL 40.20 08/19/2021   Lab Results  Component Value Date   LDLCALC 123 (  H) 08/19/2021   Lab Results  Component Value Date   TRIG 182.0 (H) 08/19/2021   Lab Results  Component Value Date   CHOLHDL 5 08/19/2021   Lab Results  Component Value Date   HGBA1C 5.9 08/19/2021      Assessment & Plan:   Problem List Items Addressed This Visit       Cardiovascular and Mediastinum   Essential hypertension    She was concerned because her blood pressure has been running in the 140s over 90s at home.  She did bring her blood pressure cuff in today for comparison and it was markedly off.  Approximate 20 points systolic and 10 points diastolic difference.  Did tell her she may need a new blood pressure cuff if is affordable.  If so she can  purchase in next office visit we can compare her new cuff with our cuff.  Continue HCTZ as prescribed        Respiratory   Acute non-recurrent pansinusitis - Primary    Patient's physical exam and length of symptoms indicative of pansinusitis.  We will put patient on doxycycline 100 mg twice daily for 7 days.  Patient says she does not respond well to amoxicillin.  Also risk of steroid for pressure in the face.  Did instruct to avoid NSAIDs while on prednisone medication.  Follow-up if no improvement or symptoms worsen.      Relevant Medications   doxycycline (VIBRA-TABS) 100 MG tablet   predniSONE (DELTASONE) 20 MG tablet     Other   Anxiety and depression    Patient has been on escitalopram in the past along with citalopram.  These were ineffective.  Recently started patient on sertraline 25 mg to titrate up to 50 mg.  Patient was having no effect at 25 mg which she went to titrate up she felt like the medication was too much for her.  We will stop sertraline and put her on fluoxetine 10 mg daily.  If she tolerates this for a week or 2 we can titrate up to 20 mg daily.  We will also put her on some BuSpar 5 mg twice daily for more immediate effect with her anxiety.  Patient to follow-up in 8 weeks, sooner if needed.  Did safely cannot figure out an appropriate medication regimen for we will send her to psychiatry.  Wellbutrin is always an option for patient but since she has so much anxiety we will defer using that medication currently.  No HI/SI/AVH.      Relevant Medications   busPIRone (BUSPAR) 5 MG tablet   FLUoxetine (PROZAC) 10 MG tablet    Meds ordered this encounter  Medications   busPIRone (BUSPAR) 5 MG tablet    Sig: Take 1 tablet (5 mg total) by mouth 2 (two) times daily.    Dispense:  60 tablet    Refill:  0    Order Specific Question:   Supervising Provider    Answer:   Loura Pardon A [[id number]]   FLUoxetine (PROZAC) 10 MG tablet    Sig: Take 1 tablet (10 mg total) by mouth  daily.    Dispense:  45 tablet    Refill:  0    Order Specific Question:   Supervising Provider    Answer:   Glori Bickers, MARNE A [[id number]]   doxycycline (VIBRA-TABS) 100 MG tablet    Sig: Take 1 tablet (100 mg total) by mouth 2 (two) times daily for 7 days.    Dispense:  14 tablet    Refill:  0    Order Specific Question:   Supervising Provider    Answer:   Loura Pardon A [[id number]]   predniSONE (DELTASONE) 20 MG tablet    Sig: Take 1 tablet (20 mg total) by mouth 2 (two) times daily with a meal for 3 days, THEN 1 tablet (20 mg total) daily with breakfast for 3 days.    Dispense:  9 tablet    Refill:  0    Order Specific Question:   Supervising Provider    Answer:   Loura Pardon A [[id number]]    Follow-up: Return in about 8 weeks (around 11/24/2021) for Medication/depression recheck.   This visit occurred during the SARS-CoV-2 public health emergency.  Safety protocols were in place, including screening questions prior to the visit, additional usage of staff PPE, and extensive cleaning of exam room while observing appropriate contact time as indicated for disinfecting solutions.   Romilda Garret, NP

## 2021-09-29 NOTE — Assessment & Plan Note (Signed)
She was concerned because her blood pressure has been running in the 140s over 90s at home.  She did bring her blood pressure cuff in today for comparison and it was markedly off.  Approximate 20 points systolic and 10 points diastolic difference.  Did tell her she may need a new blood pressure cuff if is affordable.  If so she can purchase in next office visit we can compare her new cuff with our cuff.  Continue HCTZ as prescribed ?

## 2021-10-06 ENCOUNTER — Ambulatory Visit (INDEPENDENT_AMBULATORY_CARE_PROVIDER_SITE_OTHER)
Admission: RE | Admit: 2021-10-06 | Discharge: 2021-10-06 | Disposition: A | Discharge status: Home / Self Care | Payer: 59 | Source: Ambulatory Visit | Attending: Nurse Practitioner | Admitting: Nurse Practitioner

## 2021-10-06 ENCOUNTER — Ambulatory Visit (INDEPENDENT_AMBULATORY_CARE_PROVIDER_SITE_OTHER): Payer: 59 | Admitting: Nurse Practitioner

## 2021-10-06 ENCOUNTER — Other Ambulatory Visit: Payer: Self-pay

## 2021-10-06 VITALS — BP 124/82 | HR 83 | Temp 97.8°F | Resp 12 | Wt 175.2 lb

## 2021-10-06 DIAGNOSIS — J0140 Acute pansinusitis, unspecified: Secondary | ICD-10-CM

## 2021-10-06 DIAGNOSIS — R051 Acute cough: Secondary | ICD-10-CM

## 2021-10-06 DIAGNOSIS — H938X3 Other specified disorders of ear, bilateral: Secondary | ICD-10-CM

## 2021-10-06 DIAGNOSIS — R059 Cough, unspecified: Secondary | ICD-10-CM | POA: Diagnosis not present

## 2021-10-06 DIAGNOSIS — R0989 Other specified symptoms and signs involving the circulatory and respiratory systems: Secondary | ICD-10-CM | POA: Diagnosis not present

## 2021-10-06 MED ORDER — BENZONATATE 100 MG PO CAPS: 100.0000 mg | ORAL_CAPSULE | Freq: Three times a day (TID) | ORAL | 0 refills | Status: DC | PRN

## 2021-10-06 MED ORDER — GUAIFENESIN-CODEINE 100-10 MG/5ML PO SOLN: 5.0000 mL | Freq: Every evening | ORAL | 0 refills | Status: AC | PRN

## 2021-10-06 MED ORDER — FLUTICASONE PROPIONATE 50 MCG/ACT NA SUSP: 2.0000 | Freq: Every day | NASAL | 6 refills | Status: DC

## 2021-10-06 MED ORDER — DOXYCYCLINE HYCLATE 100 MG PO TABS: 100.0000 mg | ORAL_TABLET | Freq: Two times a day (BID) | ORAL | 0 refills | Status: DC

## 2021-10-06 NOTE — Patient Instructions (Signed)
Nice to see you today ?I extended your antibiotics and sent in cough medications to the pharmacy ?Follow up if no improvement. I will be in touch with your xray once I have the results ? ?

## 2021-10-06 NOTE — Assessment & Plan Note (Signed)
Does not have the sinuses have improved.  We will extend doxycycline for 3 days pending chest x-ray. ?

## 2021-10-06 NOTE — Progress Notes (Signed)
? ?Acute Office Visit ? ?Subjective:  ? ? Patient ID: [name], female    DOB: 1966-07-13, 56 y.o.   MRN: [id number] ? ?Chief Complaint  ?Patient presents with  ? Cough  ?  For about a week and got worse on 10/04/21, chest congestion, rattling. She is done with Prednisone and Doxycycline-was treated for sinus infection on 09/29/21  ? ? ? ?Patient is in today for cough ? ?Seen on 09/29/2021 dx with sinus infection and was given steroids and doxyxycline. States that the upper airway is better but states her chest is worse. States that Thursday she started coughing more. Dry nonproductive cough. States that she does feel short of breath with coughing jags. States that if she lays down worse with the coughing but is unable to sleep sitting up. ? ?States she has taken some robitussien did helped. States she has been using the inhaler, prednisone and doxycycline.  She has been using mucinex ? ?Past Medical History:  ?Diagnosis Date  ? Arthritis   ? Basal cell carcinoma 06/26/2021  ? left shoulder top of deltoid, schedule Mohs  ? Basal cell carcinoma 08/25/2021  ? Left distal ant thigh - EDC  ? Basal cell carcinoma 08/25/2021  ? Left upper back paraspinal - EDC  ? Basal cell carcinoma 08/25/2021  ? Right infrascapular - EDC  ? Basal cell carcinoma 08/25/2021  ? Right mid back braline -EDC  ? Chicken pox   ? Depression   ? GERD (gastroesophageal reflux disease)   ? High blood pressure   ? High cholesterol   ? History of gastroesophageal reflux (GERD)   ? History of headache   ? History of migraine   ? History of UTI   ? Polyp of colon   ? PONV (postoperative nausea and vomiting)   ? ? ?Past Surgical History:  ?Procedure Laterality Date  ? BREAST REDUCTION SURGERY  2000  ? [place]  ? COLONOSCOPY WITH PROPOFOL N/A 01/22/2017  ? Procedure: COLONOSCOPY WITH PROPOFOL;  Surgeon: [name], MD;  Location: [facility];  Service: Endoscopy;  Laterality: N/A;  ? PARTIAL HYSTERECTOMY  1997  ? POLYPECTOMY   01/22/2017  ? Procedure: POLYPECTOMY;  Surgeon: [name], MD;  Location: [facility];  Service: Endoscopy;;  ? REDUCTION MAMMAPLASTY Bilateral 2000  ? BILAT  ? XI ROBOTIC LAPAROSCOPIC ASSISTED APPENDECTOMY N/A 02/21/2021  ? Procedure: XI ROBOTIC LAPAROSCOPIC ASSISTED APPENDECTOMY;  Surgeon: Herbert Pun, MD;  Location: [facility];  Service: General;  Laterality: N/A;  ? ? ?Family History  ?Problem Relation Age of Onset  ? Arthritis Mother   ? Peripheral Artery Disease Mother   ? Arthritis Father   ? Hyperlipidemia Father   ? Stroke Father   ? High blood pressure Father   ? Diabetes Father   ? Protein S deficiency Father   ? Protein S deficiency Sister   ? Hodgkin's lymphoma Maternal Uncle   ? Early death Maternal Uncle 9  ? Cancer Maternal Grandmother   ?     intestinal  ? Arthritis Maternal Grandmother   ? Stroke Maternal Grandmother   ? Birth defects Maternal Grandmother   ? High Cholesterol Maternal Grandmother   ? Arthritis Maternal Grandfather   ? Cancer Maternal Grandfather   ? Early death Maternal Grandfather   ? Emphysema Maternal Grandfather   ? Cancer Paternal Grandmother   ? Alcohol abuse Paternal Grandmother   ? Heart attack Paternal Grandmother   ? Hearing loss Paternal Grandfather   ?  Breast cancer Neg Hx   ? ? ?Social History  ? ?Socioeconomic History  ? Marital status: Widowed  ?  Spouse name: Not on file  ? Number of children: Not on file  ? Years of education: Not on file  ? Highest education level: Not on file  ?Occupational History  ? Not on file  ?Tobacco Use  ? Smoking status: Former  ?  Types: Cigarettes  ?  Start date: 07/26/2013  ?  Quit date: 2016  ?  Years since quitting: 7.2  ? Smokeless tobacco: Never  ?Vaping Use  ? Vaping Use: Never used  ?Substance and Sexual Activity  ? Alcohol use: No  ?  Comment: rare  ? Drug use: No  ? Sexual activity: Not on file  ?Other Topics Concern  ? Not on file  ?Social History Narrative  ? Not on file  ? ?Social Determinants of Health   ? ?Financial Resource Strain: Not on file  ?Food Insecurity: Not on file  ?Transportation Needs: Not on file  ?Physical Activity: Not on file  ?Stress: Not on file  ?Social Connections: Not on file  ?Intimate Partner Violence: Not on file  ? ? ?Outpatient Medications Prior to Visit  ?Medication Sig Dispense Refill  ? albuterol (VENTOLIN HFA) 108 (90 Base) MCG/ACT inhaler Inhale 2 puffs into the lungs every 4 (four) hours as needed for wheezing or shortness of breath (cough, shortness of breath or wheezing.). 6.7 g 3  ? ascorbic acid (VITAMIN C) 500 MG tablet Take 500 mg by mouth daily.    ? aspirin EC 81 MG tablet Take 81 mg by mouth daily.    ? busPIRone (BUSPAR) 5 MG tablet Take 1 tablet (5 mg total) by mouth 2 (two) times daily. 60 tablet 0  ? FLUoxetine (PROZAC) 10 MG tablet Take 1 tablet (10 mg total) by mouth daily. 45 tablet 0  ? hydrochlorothiazide (MICROZIDE) 12.5 MG capsule TAKE 1 CAPSULE BY MOUTH DAILY. 90 capsule 0  ? Multiple Vitamin (MULTIVITAMIN WITH MINERALS) TABS tablet Take 1 tablet by mouth daily.    ? omeprazole (PRILOSEC) 20 MG capsule Take 1 capsule (20 mg total) by mouth daily. 90 capsule 1  ? Roflumilast ([name]) 0.3 % CREA Apply 1 application. topically at bedtime. To feet and hands 60 g 1  ? Tapinarof ([name]) 1 % CREA Apply 1 application topically at bedtime. 60 g 3  ? vitamin B-12 (CYANOCOBALAMIN) 1000 MCG tablet Take 1,000 mcg by mouth daily.    ? doxycycline (VIBRA-TABS) 100 MG tablet Take 1 tablet (100 mg total) by mouth 2 (two) times daily for 7 days. 14 tablet 0  ? fluticasone (FLONASE) 50 MCG/ACT nasal spray Place 2 sprays into both nostrils daily. 16 g 6  ? ?No facility-administered medications prior to visit.  ? ? ?Allergies  ?Allergen Reactions  ? Other   ?  Dissolvable sutures cause "reaction"  ? Percocet [Oxycodone-Acetaminophen] Nausea Only  ? ? ?Review of Systems  ?Constitutional:  Positive for appetite change, chills, fatigue and fever (saturday night was 100.).  ?HENT:   Positive for congestion, postnasal drip and sore throat. Negative for ear discharge and ear pain.   ?Respiratory:  Positive for cough (dry non prodcutive), shortness of breath (with coughing jag) and wheezing.   ?Cardiovascular:  Positive for chest pain.  ?Gastrointestinal:  Negative for diarrhea, nausea and vomiting.  ?Neurological:  Negative for headaches.  ? ?   ?Objective:  ?  ?Physical Exam ?Vitals and nursing note reviewed.  ?  Constitutional:   ?   Appearance: Normal appearance.  ?HENT:  ?   Right Ear: Tympanic membrane, ear canal and external ear normal.  ?   Left Ear: Tympanic membrane, ear canal and external ear normal.  ?Cardiovascular:  ?   Rate and Rhythm: Normal rate and regular rhythm.  ?   Heart sounds: Normal heart sounds.  ?Pulmonary:  ?   Effort: Pulmonary effort is normal.  ?   Breath sounds: Rhonchi present.  ?Abdominal:  ?   General: Bowel sounds are normal.  ?Lymphadenopathy:  ?   Cervical: No cervical adenopathy.  ?Skin: ?   General: Skin is warm.  ?Neurological:  ?   Mental Status: She is alert.  ? ? ?BP 124/82   Pulse 83   Temp 97.8 ?F (36.6 ?C)   Resp 12   Wt 175 lb 4 oz (79.5 kg)   SpO2 97%   BMI 30.08 kg/m?  ?Wt Readings from Last 3 Encounters:  ?10/06/21 175 lb 4 oz (79.5 kg)  ?09/29/21 177 lb 6 oz (80.5 kg)  ?08/19/21 173 lb (78.5 kg)  ? ? ?Health Maintenance Due  ?Topic Date Due  ? HIV Screening  Never done  ? Hepatitis C Screening  Never done  ? Zoster Vaccines- Shingrix (1 of 2) Never done  ? PAP SMEAR-Modifier  09/11/2019  ? ? ?There are no preventive care reminders to display for this patient. ? ? ?Lab Results  ?Component Value Date  ? TSH 2.99 12/30/2020  ? ?Lab Results  ?Component Value Date  ? WBC 7.6 08/19/2021  ? HGB 13.9 08/19/2021  ? HCT 42.1 08/19/2021  ? MCV 90.7 08/19/2021  ? PLT 346.0 08/19/2021  ? ?Lab Results  ?Component Value Date  ? NA 139 08/19/2021  ? K 4.7 08/19/2021  ? CO2 32 08/19/2021  ? GLUCOSE 91 08/19/2021  ? BUN 15 08/19/2021  ? CREATININE 0.90  08/19/2021  ? BILITOT 0.3 08/19/2021  ? ALKPHOS 78 08/19/2021  ? AST 17 08/19/2021  ? ALT 20 08/19/2021  ? PROT 7.5 08/19/2021  ? ALBUMIN 4.6 08/19/2021  ? CALCIUM 10.7 (H) 08/19/2021  ? ANIONGAP 10 02/21/2021  ? GFR 71.47

## 2021-10-06 NOTE — Assessment & Plan Note (Signed)
Refill patient's fluticasone. ?

## 2021-10-06 NOTE — Assessment & Plan Note (Signed)
AndWe will send in some Tessalon Perles to take as needed along with guaifenesin-codeine to take at bedtime.  We will also extend patient's doxycycline by 3 days do a chest x-ray in office.  Pending result ?

## 2021-10-08 ENCOUNTER — Other Ambulatory Visit: Payer: Self-pay | Admitting: Nurse Practitioner

## 2021-10-08 DIAGNOSIS — J189 Pneumonia, unspecified organism: Secondary | ICD-10-CM

## 2021-10-08 MED ORDER — AMOXICILLIN-POT CLAVULANATE 875-125 MG PO TABS: 1.0000 | ORAL_TABLET | Freq: Two times a day (BID) | ORAL | 0 refills | Status: DC

## 2021-10-08 NOTE — Progress Notes (Signed)
Orders only

## 2021-10-09 ENCOUNTER — Telehealth: Payer: Self-pay

## 2021-10-09 NOTE — Telephone Encounter (Signed)
Completed specimen tracking and history from [facility] progress notes and photos.  ?

## 2021-11-21 ENCOUNTER — Other Ambulatory Visit: Payer: Self-pay | Admitting: Nurse Practitioner

## 2021-11-21 DIAGNOSIS — I10 Essential (primary) hypertension: Secondary | ICD-10-CM

## 2021-11-24 ENCOUNTER — Ambulatory Visit: Payer: 59 | Admitting: Nurse Practitioner

## 2021-11-24 ENCOUNTER — Ambulatory Visit: Payer: 59 | Admitting: Dermatology

## 2021-11-24 DIAGNOSIS — L814 Other melanin hyperpigmentation: Secondary | ICD-10-CM

## 2021-11-24 DIAGNOSIS — D1800 Hemangioma unspecified site: Secondary | ICD-10-CM | POA: Diagnosis not present

## 2021-11-24 DIAGNOSIS — D0362 Melanoma in situ of left upper limb, including shoulder: Secondary | ICD-10-CM | POA: Diagnosis not present

## 2021-11-24 DIAGNOSIS — Z85828 Personal history of other malignant neoplasm of skin: Secondary | ICD-10-CM

## 2021-11-24 DIAGNOSIS — L409 Psoriasis, unspecified: Secondary | ICD-10-CM | POA: Diagnosis not present

## 2021-11-24 DIAGNOSIS — B078 Other viral warts: Secondary | ICD-10-CM

## 2021-11-24 DIAGNOSIS — L821 Other seborrheic keratosis: Secondary | ICD-10-CM

## 2021-11-24 DIAGNOSIS — L578 Other skin changes due to chronic exposure to nonionizing radiation: Secondary | ICD-10-CM | POA: Diagnosis not present

## 2021-11-24 DIAGNOSIS — B079 Viral wart, unspecified: Secondary | ICD-10-CM

## 2021-11-24 DIAGNOSIS — Z1283 Encounter for screening for malignant neoplasm of skin: Secondary | ICD-10-CM

## 2021-11-24 DIAGNOSIS — D229 Melanocytic nevi, unspecified: Secondary | ICD-10-CM

## 2021-11-24 DIAGNOSIS — D039 Melanoma in situ, unspecified: Secondary | ICD-10-CM

## 2021-11-24 DIAGNOSIS — D485 Neoplasm of uncertain behavior of skin: Secondary | ICD-10-CM

## 2021-11-24 HISTORY — DX: Melanoma in situ, unspecified: D03.9

## 2021-11-24 NOTE — Progress Notes (Signed)
Follow-Up Visit   Subjective  Judy Huang is a 56 y.o. female who presents for the following: Other (History of BCC - The patient presents for Upper Body Skin Exam (UBSE) for skin cancer screening and mole check.  The patient has spots, moles and lesions to be evaluated, some may be new or changing and the patient has concerns that these could be cancer./).  The following portions of the chart were reviewed this encounter and updated as appropriate:   Tobacco  Allergies  Meds  Problems  Med Hx  Surg Hx  Fam Hx     Review of Systems:  No other skin or systemic complaints except as noted in HPI or Assessment and Plan.  Objective  Well appearing patient in no apparent distress; mood and affect are within normal limits.  All skin waist up examined.  Hands Pinkness  Left shoulder top of deltoid, left thigh, right upper back paraspinal, right infrascapula, right mid back braline Healing Mohs excision site of left shoulder top of deltoid. Well healed EDC sites.  Left post shoulder Irregular brown macule 0.7 cm  Right Palmar Middle 5th Finger Verrucous papules -- Discussed viral etiology and contagion.    Assessment & Plan   Lentigines - Scattered tan macules - Due to sun exposure - Benign-appearing, observe - Recommend daily broad spectrum sunscreen SPF 30+ to sun-exposed areas, reapply every 2 hours as needed. - Call for any changes  Seborrheic Keratoses - Stuck-on, waxy, tan-brown papules and/or plaques  - Benign-appearing - Discussed benign etiology and prognosis. - Observe - Call for any changes  Melanocytic Nevi - Tan-brown and/or pink-flesh-colored symmetric macules and papules - Benign appearing on exam today - Observation - Call clinic for new or changing moles - Recommend daily use of broad spectrum spf 30+ sunscreen to sun-exposed areas.   Hemangiomas - Red papules - Discussed benign nature - Observe - Call for any changes  Actinic Damage -  Chronic condition, secondary to cumulative UV/sun exposure - diffuse scaly erythematous macules with underlying dyspigmentation - Recommend daily broad spectrum sunscreen SPF 30+ to sun-exposed areas, reapply every 2 hours as needed.  - Staying in the shade or wearing long sleeves, sun glasses (UVA+UVB protection) and wide brim hats (4-inch brim around the entire circumference of the hat) are also recommended for sun protection.  - Call for new or changing lesions.  Skin cancer screening performed today.  History of basal cell carcinoma (BCC) Left shoulder top of deltoid, left thigh, right upper back paraspinal, right infrascapula, right mid back braline Clear. Observe for recurrence. Call clinic for new or changing lesions.  Recommend regular skin exams, daily broad-spectrum spf 30+ sunscreen use, and photoprotection.    Neoplasm of uncertain behavior of skin Left post shoulder Epidermal / dermal shaving  Lesion diameter (cm):  0.7 Informed consent: discussed and consent obtained   Timeout: patient name, date of birth, surgical site, and procedure verified   Procedure prep:  Patient was prepped and draped in usual sterile fashion Prep type:  Isopropyl alcohol Anesthesia: the lesion was anesthetized in a standard fashion   Anesthetic:  1% lidocaine w/ epinephrine 1-100,000 buffered w/ 8.4% NaHCO3 Instrument used: flexible razor blade   Hemostasis achieved with: pressure, aluminum chloride and electrodesiccation   Outcome: patient tolerated procedure well   Post-procedure details: sterile dressing applied and wound care instructions given   Dressing type: bandage and petrolatum    Specimen 1 - Surgical pathology Differential Diagnosis: nevus vs dysplastic nevus  Check Margins: No  Psoriasis Hands Psoriasis is a chronic non-curable, but treatable genetic/hereditary disease that may have other systemic features affecting other organ systems such as joints (Psoriatic Arthritis). It is  associated with an increased risk of inflammatory bowel disease, heart disease, non-alcoholic fatty liver disease, and depression.   Chronic and persistent condition with duration or expected duration over one year. Condition is symptomatic / bothersome to patient. Not to goal.  Improving - continue [name] cream qd  Related Medications Tapinarof ([name]) 1 % CREA Apply 1 application topically at bedtime.  Viral warts, unspecified type Right Palmar Middle 5th Finger Destruction of lesion - Right Palmar Middle 5th Finger Complexity: simple   Destruction method: cryotherapy   Informed consent: discussed and consent obtained   Timeout:  patient name, date of birth, surgical site, and procedure verified Lesion destroyed using liquid nitrogen: Yes   Region frozen until ice ball extended beyond lesion: Yes   Outcome: patient tolerated procedure well with no complications   Post-procedure details: wound care instructions given    Intralesional injection - Right Palmar Middle 5th Finger Location: left palmar middle 5th finger  Informed Consent: Discussed risks (infection, pain, bleeding, bruising, thinning of the skin, loss of skin pigment, lack of resolution, and recurrence of lesion) and benefits of the procedure, as well as the alternatives. Informed consent was obtained. Preparation: The area was prepared a standard fashion.  Anesthesia:none  Procedure Details: An intralesional injection was performed with candida antigen. 0.1 cc in total were injected.  Total number of injections: 1  Plan: The patient was instructed on post-op care. Recommend OTC analgesia as needed for pain.  Skin cancer screening  Return in about 2 years (around 11/25/2023) for wart follow up, 6 mos TBSE.  I, Ashok Cordia, CMA, am acting as scribe for Sarina Ser, MD . Documentation: I have reviewed the above documentation for accuracy and completeness, and I agree with the above.  Sarina Ser, MD

## 2021-11-24 NOTE — Patient Instructions (Signed)
Cryotherapy Aftercare  Wash gently with soap and water everyday.   Apply Vaseline and Band-Aid daily until healed.    Wound Care Instructions  Cleanse wound gently with soap and water once a day then pat dry with clean gauze. Apply a thing coat of Petrolatum (petroleum jelly, "Vaseline") over the wound (unless you have an allergy to this). We recommend that you use a new, sterile tube of Vaseline. Do not pick or remove scabs. Do not remove the yellow or white "healing tissue" from the base of the wound.  Cover the wound with fresh, clean, nonstick gauze and secure with paper tape. You may use Band-Aids in place of gauze and tape if the would is small enough, but would recommend trimming much of the tape off as there is often too much. Sometimes Band-Aids can irritate the skin.  You should call the office for your biopsy report after 1 week if you have not already been contacted.  If you experience any problems, such as abnormal amounts of bleeding, swelling, significant bruising, significant pain, or evidence of infection, please call the office immediately.  FOR ADULT SURGERY PATIENTS: If you need something for pain relief you may take 1 extra strength Tylenol (acetaminophen) AND 2 Ibuprofen (200mg each) together every 4 hours as needed for pain. (do not take these if you are allergic to them or if you have a reason you should not take them.) Typically, you may only need pain medication for 1 to 3 days.        If You Need Anything After Your Visit  If you have any questions or concerns for your doctor, please call our main line at [phone] and press option 4 to reach your doctor's medical assistant. If no one answers, please leave a voicemail as directed and we will return your call as soon as possible. Messages left after 4 pm will be answered the following business day.   You may also send us a message via MyChart. We typically respond to MyChart messages within 1-2 business  days.  For prescription refills, please ask your pharmacy to contact our office. Our fax number is [phone].  If you have an urgent issue when the clinic is closed that cannot wait until the next business day, you can page your doctor at the number below.    Please note that while we do our best to be available for urgent issues outside of office hours, we are not available 24/7.   If you have an urgent issue and are unable to reach us, you may choose to seek medical care at your doctor's office, retail clinic, urgent care center, or emergency room.  If you have a medical emergency, please immediately call 911 or go to the emergency department.  Pager Numbers  - Dr. Kowalski: [phone]  - Dr. Moye: [phone]  - Dr. Stewart: [phone]  In the event of inclement weather, please call our main line at [phone] for an update on the status of any delays or closures.  Dermatology Medication Tips: Please keep the boxes that topical medications come in in order to help keep track of the instructions about where and how to use these. Pharmacies typically print the medication instructions only on the boxes and not directly on the medication tubes.   If your medication is too expensive, please contact our office at [phone] option 4 or send us a message through MyChart.   We are unable to tell what your co-pay for medications will be   in advance as this is different depending on your insurance coverage. However, we may be able to find a substitute medication at lower cost or fill out paperwork to get insurance to cover a needed medication.   If a prior authorization is required to get your medication covered by your insurance company, please allow us 1-2 business days to complete this process.  Drug prices often vary depending on where the prescription is filled and some pharmacies may offer cheaper prices.  The website www.goodrx.com contains coupons for medications through  different pharmacies. The prices here do not account for what the cost may be with help from insurance (it may be cheaper with your insurance), but the website can give you the price if you did not use any insurance.  - You can print the associated coupon and take it with your prescription to the pharmacy.  - You may also stop by our office during regular business hours and pick up a GoodRx coupon card.  - If you need your prescription sent electronically to a different pharmacy, notify our office through [facility] MyChart or by phone at [phone] option 4.     Si Usted Necesita Algo Despus de Su Visita  Tambin puede enviarnos un mensaje a travs de MyChart. Por lo general respondemos a los mensajes de MyChart en el transcurso de 1 a 2 das hbiles.  Para renovar recetas, por favor pida a su farmacia que se ponga en contacto con nuestra oficina. Nuestro nmero de fax es el [phone].  Si tiene un asunto urgente cuando la clnica est cerrada y que no puede esperar hasta el siguiente da hbil, puede llamar/localizar a su doctor(a) al nmero que aparece a continuacin.   Por favor, tenga en cuenta que aunque hacemos todo lo posible para estar disponibles para asuntos urgentes fuera del horario de oficina, no estamos disponibles las 24 horas del da, los 7 das de la semana.   Si tiene un problema urgente y no puede comunicarse con nosotros, puede optar por buscar atencin mdica  en el consultorio de su doctor(a), en una clnica privada, en un centro de atencin urgente o en una sala de emergencias.  Si tiene una emergencia mdica, por favor llame inmediatamente al 911 o vaya a la sala de emergencias.  Nmeros de bper  - Dr. Kowalski: [phone]  - Dra. Moye: [phone]  - Dra. Stewart: [phone]  En caso de inclemencias del tiempo, por favor llame a nuestra lnea principal al [phone] para una actualizacin sobre el estado de cualquier retraso o cierre.  Consejos  para la medicacin en dermatologa: Por favor, guarde las cajas en las que vienen los medicamentos de uso tpico para ayudarle a seguir las instrucciones sobre dnde y cmo usarlos. Las farmacias generalmente imprimen las instrucciones del medicamento slo en las cajas y no directamente en los tubos del medicamento.   Si su medicamento es muy caro, por favor, pngase en contacto con nuestra oficina llamando al [phone] y presione la opcin 4 o envenos un mensaje a travs de MyChart.   No podemos decirle cul ser su copago por los medicamentos por adelantado ya que esto es diferente dependiendo de la cobertura de su seguro. Sin embargo, es posible que podamos encontrar un medicamento sustituto a menor costo o llenar un formulario para que el seguro cubra el medicamento que se considera necesario.   Si se requiere una autorizacin previa para que su compaa de seguros cubra su medicamento, por favor permtanos de 1 a   2 das hbiles para completar este proceso.  Los precios de los medicamentos varan con frecuencia dependiendo del lugar de dnde se surte la receta y alguna farmacias pueden ofrecer precios ms baratos.  El sitio web www.goodrx.com tiene cupones para medicamentos de diferentes farmacias. Los precios aqu no tienen en cuenta lo que podra costar con la ayuda del seguro (puede ser ms barato con su seguro), pero el sitio web puede darle el precio si no utiliz ningn seguro.  - Puede imprimir el cupn correspondiente y llevarlo con su receta a la farmacia.  - Tambin puede pasar por nuestra oficina durante el horario de atencin regular y recoger una tarjeta de cupones de GoodRx.  - Si necesita que su receta se enve electrnicamente a una farmacia diferente, informe a nuestra oficina a travs de MyChart de [facility] o por telfono llamando al [phone] y presione la opcin 4.  

## 2021-12-02 ENCOUNTER — Telehealth: Payer: Self-pay

## 2021-12-02 NOTE — Telephone Encounter (Signed)
-----   Message from Ralene Bathe, MD sent at 12/01/2021  6:40 PM EDT ----- ?Diagnosis ?Skin , left post shoulder ?MELANOMA IN SITU, MARGIN CLOSE ? ?Cancer - Melanoma in situ ?Superficial and early ?Needs Wide Local Excision ?Schedule for surgery ?Called pt today 12/01/21 and No Answer; left message that I called about path and needs surgery, but did not leave diagnosis on voicemail.  Advised will call tomorrow 12/02/21 to discuss further. ? ?

## 2021-12-02 NOTE — Telephone Encounter (Signed)
Dr Nehemiah Massed advised patient of results. Scheduled her for [facility] 02/10/2022 at 1:30 pm/hd ?

## 2021-12-09 ENCOUNTER — Encounter: Payer: Self-pay | Admitting: Dermatology

## 2021-12-22 ENCOUNTER — Telehealth: Payer: Self-pay

## 2021-12-22 NOTE — Telephone Encounter (Signed)
Called patient to clarify medication she is using for a PA. Patient sent [name] a message on 09/25/21 that her [name] cream was not helping her psoriasis at all and she wanted to try something different. Dr. Nehemiah Massed sent in [place]. Patient confirmed that Anner [place] is the medication she has been using and not the [name] that is listed in her 11/24/21 office visit note.

## 2022-01-06 ENCOUNTER — Encounter: Payer: Self-pay | Admitting: Nurse Practitioner

## 2022-01-06 ENCOUNTER — Ambulatory Visit (INDEPENDENT_AMBULATORY_CARE_PROVIDER_SITE_OTHER)
Admission: RE | Admit: 2022-01-06 | Discharge: 2022-01-06 | Disposition: A | Discharge status: Home / Self Care | Payer: 59 | Source: Ambulatory Visit | Attending: Nurse Practitioner | Admitting: Nurse Practitioner

## 2022-01-06 ENCOUNTER — Ambulatory Visit (INDEPENDENT_AMBULATORY_CARE_PROVIDER_SITE_OTHER): Payer: 59 | Admitting: Nurse Practitioner

## 2022-01-06 VITALS — BP 100/70 | HR 78 | Temp 97.3°F | Resp 12 | Ht 64.0 in | Wt 173.0 lb

## 2022-01-06 DIAGNOSIS — H6983 Other specified disorders of Eustachian tube, bilateral: Secondary | ICD-10-CM | POA: Diagnosis not present

## 2022-01-06 DIAGNOSIS — J189 Pneumonia, unspecified organism: Secondary | ICD-10-CM | POA: Insufficient documentation

## 2022-01-06 DIAGNOSIS — J3489 Other specified disorders of nose and nasal sinuses: Secondary | ICD-10-CM | POA: Diagnosis not present

## 2022-01-06 DIAGNOSIS — R918 Other nonspecific abnormal finding of lung field: Secondary | ICD-10-CM | POA: Diagnosis not present

## 2022-01-06 DIAGNOSIS — M47814 Spondylosis without myelopathy or radiculopathy, thoracic region: Secondary | ICD-10-CM | POA: Diagnosis not present

## 2022-01-06 DIAGNOSIS — J069 Acute upper respiratory infection, unspecified: Secondary | ICD-10-CM | POA: Insufficient documentation

## 2022-01-06 HISTORY — DX: Pneumonia, unspecified organism: J18.9

## 2022-01-06 MED ORDER — PREDNISONE 20 MG PO TABS: ORAL_TABLET | ORAL | 0 refills | Status: AC

## 2022-01-06 NOTE — Patient Instructions (Signed)
Nice to see you today If you are not improving by Friday let me know and we will start antibiotics Follow up if no improvement I will be in touch with the chest xray once I have the result

## 2022-01-06 NOTE — Assessment & Plan Note (Signed)
Likely upper respiratory infection.  We will give a few days before starting antibiotics to discuss this with patient and she is agreeable.  We will start some prednisone 20 mg taper did go over precautions in regards to avoiding NSAIDs while on medication

## 2022-01-06 NOTE — Assessment & Plan Note (Signed)
Fluid behind bilateral TMs patient already using Flonase we will start prednisone 20 mg taper prescription.

## 2022-01-06 NOTE — Assessment & Plan Note (Signed)
Patient never presented for repeat chest x-ray.  Since patient is in office today will have her repeat chest x-ray did discuss the importance of this.  Pending radiological exam

## 2022-01-06 NOTE — Progress Notes (Signed)
Acute Office Visit  Subjective:     Patient ID: Judy Huang, female    DOB: [birth year], 56 y.o.   MRN: [id number]  Chief Complaint  Patient presents with   Cough    Sx started around 12/27/21-started with post nasal drainage, Not coughing up a lot of phlegm at this time, sinus pain, nose burns, ears are popping. Maybe had a fever at first but not anymore. Has been taking Mucinex. Covid test negative on  12/31/21     Patient is in today for cough  States it started approx 12/27/2021. States that she has started using the air and sleeping with the windos open.  She thought it was alleriges. She did take a covid test on 12/31/2021.  No covid vaccine No flu Mucinex and robitussin at night and has helped. States that she feels that she has stayed steady. No change  Used albuterol inhaler a couple tiem   Review of Systems  Constitutional:  Positive for malaise/fatigue. Negative for chills and fever.       Decreased appetite Increased fluid intake  HENT:  Positive for congestion, ear pain (popping) and sore throat (scrtachy). Negative for ear discharge.        PND  Respiratory:  Positive for cough (non productive) and shortness of breath (with coughing  jags).   Musculoskeletal:  Negative for joint pain and myalgias.  Neurological:  Positive for headaches.        Objective:    BP 100/70   Pulse 78   Temp (!) 97.3 F (36.3 C)   Resp 12   Ht '5\' 4"'$  (1.626 m)   Wt 173 lb (78.5 kg)   SpO2 96%   BMI 29.70 kg/m    Physical Exam Vitals and nursing note reviewed.  Constitutional:      Appearance: Normal appearance.  HENT:     Right Ear: Ear canal and external ear normal.     Left Ear: Ear canal and external ear normal.     Ears:     Comments: Clear fluid behind bilateral TMs    Mouth/Throat:     Mouth: Mucous membranes are moist.     Pharynx: Oropharynx is clear.  Cardiovascular:     Rate and Rhythm: Normal rate and regular rhythm.     Heart sounds: Normal  heart sounds.  Pulmonary:     Effort: Pulmonary effort is normal.     Breath sounds: Normal breath sounds.  Lymphadenopathy:     Cervical: Cervical adenopathy present.  Neurological:     Mental Status: She is alert.     No results found for any visits on 01/06/22.      Assessment & Plan:   Problem List Items Addressed This Visit       Respiratory   Upper respiratory tract infection - Primary    Likely upper respiratory infection.  We will give a few days before starting antibiotics to discuss this with patient and she is agreeable.  We will start some prednisone 20 mg taper did go over precautions in regards to avoiding NSAIDs while on medication      Pneumonia of both lower lobes due to infectious organism    Patient never presented for repeat chest x-ray.  Since patient is in office today will have her repeat chest x-ray did discuss the importance of this.  Pending radiological exam        Nervous and Auditory   Eustachian tube dysfunction, bilateral    Fluid  behind bilateral TMs patient already using Flonase we will start prednisone 20 mg taper prescription.      Relevant Medications   predniSONE (DELTASONE) 20 MG tablet     Other   Sinus pressure    We will start prednisone 20 mg taper.  Hold off on antibiotic use currently not improving by Friday she is reach out via MyChart we can start antibiotics      Relevant Medications   predniSONE (DELTASONE) 20 MG tablet    Meds ordered this encounter  Medications   predniSONE (DELTASONE) 20 MG tablet    Sig: Take 1 tablet (20 mg total) by mouth 2 (two) times daily with a meal for 3 days, THEN 1 tablet (20 mg total) daily with breakfast for 3 days.    Dispense:  9 tablet    Refill:  0    Order Specific Question:   Supervising Provider    Answer:   TOWER, MARNE A [[id number]    Return if symptoms worsen or fail to improve.  Romilda Garret, NP

## 2022-01-06 NOTE — Assessment & Plan Note (Signed)
We will start prednisone 20 mg taper.  Hold off on antibiotic use currently not improving by Friday she is reach out via MyChart we can start antibiotics

## 2022-01-07 ENCOUNTER — Other Ambulatory Visit: Payer: Self-pay | Admitting: Nurse Practitioner

## 2022-01-07 DIAGNOSIS — J189 Pneumonia, unspecified organism: Secondary | ICD-10-CM

## 2022-01-09 ENCOUNTER — Other Ambulatory Visit: Payer: Self-pay | Admitting: Nurse Practitioner

## 2022-01-09 ENCOUNTER — Telehealth: Payer: Self-pay

## 2022-01-09 DIAGNOSIS — J069 Acute upper respiratory infection, unspecified: Secondary | ICD-10-CM

## 2022-01-09 MED ORDER — AMOXICILLIN-POT CLAVULANATE 875-125 MG PO TABS: 1.0000 | ORAL_TABLET | Freq: Two times a day (BID) | ORAL | 0 refills | Status: AC

## 2022-01-09 MED ORDER — AMOXICILLIN-POT CLAVULANATE 875-125 MG PO TABS: 1.0000 | ORAL_TABLET | Freq: Two times a day (BID) | ORAL | 0 refills | Status: DC

## 2022-01-09 NOTE — Telephone Encounter (Signed)
[facility], I called [facility] and cancelled RX that was sent to them. Patient advised.

## 2022-02-02 ENCOUNTER — Ambulatory Visit: Payer: 59 | Admitting: Dermatology

## 2022-02-10 ENCOUNTER — Ambulatory Visit: Payer: 59 | Admitting: Dermatology

## 2022-02-10 ENCOUNTER — Encounter: Payer: Self-pay | Admitting: Dermatology

## 2022-02-10 ENCOUNTER — Telehealth: Payer: Self-pay | Admitting: Nurse Practitioner

## 2022-02-10 DIAGNOSIS — D0362 Melanoma in situ of left upper limb, including shoulder: Secondary | ICD-10-CM

## 2022-02-10 DIAGNOSIS — H04123 Dry eye syndrome of bilateral lacrimal glands: Secondary | ICD-10-CM | POA: Diagnosis not present

## 2022-02-10 DIAGNOSIS — I10 Essential (primary) hypertension: Secondary | ICD-10-CM

## 2022-02-10 DIAGNOSIS — L409 Psoriasis, unspecified: Secondary | ICD-10-CM

## 2022-02-10 DIAGNOSIS — H18593 Other hereditary corneal dystrophies, bilateral: Secondary | ICD-10-CM | POA: Diagnosis not present

## 2022-02-10 DIAGNOSIS — L905 Scar conditions and fibrosis of skin: Secondary | ICD-10-CM | POA: Diagnosis not present

## 2022-02-10 MED ORDER — OMEPRAZOLE 20 MG PO CPDR: 20.0000 mg | DELAYED_RELEASE_CAPSULE | Freq: Every day | ORAL | 1 refills | Status: DC

## 2022-02-10 MED ORDER — MUPIROCIN 2 % EX OINT: 1.0000 | TOPICAL_OINTMENT | Freq: Every day | CUTANEOUS | 0 refills | Status: DC

## 2022-02-10 MED ORDER — DOXYCYCLINE HYCLATE 100 MG PO TABS: 100.0000 mg | ORAL_TABLET | Freq: Two times a day (BID) | ORAL | 0 refills | Status: AC

## 2022-02-10 NOTE — Telephone Encounter (Signed)
Patient called and stated that she needs her medication omeprozole needs to be filled for her acid reflux and [facility]  [id number] [place] [place] [address]:  [phone] Fax:  [phone]      Couldn't find it on her chart and she [facility]. Call back number [phone]

## 2022-02-10 NOTE — Telephone Encounter (Signed)
Spoke with patient and she is taking Omeprazole 20 mg 1 capsule daily, and at times does have to take 2 daily like if she had some tomato based/acid based food intake.  Not often. Please review.[facility] in [facility] chart  Advised patient there may have been a misunderstanding on the last visit as it said course was completed. Apologized to the patient for the confusion. Please review request. Thank you

## 2022-02-10 NOTE — Patient Instructions (Signed)

## 2022-02-10 NOTE — Telephone Encounter (Signed)
Medication sent in. 

## 2022-02-10 NOTE — Progress Notes (Signed)
Follow-Up Visit   Subjective  Judy Huang is a 56 y.o. female who presents for the following: Procedure (Biopsy proven Melanoma in situ of left post shoulder - excise today).  The following portions of the chart were reviewed this encounter and updated as appropriate:   Tobacco  Allergies  Meds  Problems  Med Hx  Surg Hx  Fam Hx     Review of Systems:  No other skin or systemic complaints except as noted in HPI or Assessment and Plan.  Objective  Well appearing patient in no apparent distress; mood and affect are within normal limits.  A focused examination was performed including arms. Relevant physical exam findings are noted in the Assessment and Plan.  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) Left Shoulder - Posterior Skin excision  Lesion length (cm):  1.1 Lesion width (cm):  1.1 Margin per side (cm):  0.5 Total excision diameter (cm):  2.1 Informed consent: discussed and consent obtained   Timeout: patient name, date of birth, surgical site, and procedure verified   Procedure prep:  Patient was prepped and draped in usual sterile fashion Prep type:  Isopropyl alcohol and povidone-iodine Anesthesia: the lesion was anesthetized in a standard fashion   Anesthetic:  1% lidocaine w/ epinephrine 1-100,000 buffered w/ 8.4% NaHCO3 Instrument used: #15 blade   Hemostasis achieved with: pressure   Hemostasis achieved with comment:  Electrocautery Outcome: patient tolerated procedure well with no complications   Post-procedure details: sterile dressing applied and wound care instructions given   Dressing type: bandage and pressure dressing (mupirocin)    Skin repair Complexity:  Complex 6 cm Reason for type of repair: reduce tension to allow closure, reduce the risk of dehiscence, infection, and necrosis, reduce subcutaneous dead space and avoid a hematoma, allow  closure of the large defect, preserve normal anatomy, preserve normal anatomical and functional relationships and enhance both functionality and cosmetic results   Undermining: area extensively undermined   Undermining comment:  Undermining defect 2.1 cm Subcutaneous layers (deep stitches):  Suture size:  2-0 Suture type: Vicryl (polyglactin 910)   Subcutaneous suture technique: inverted dermal. Fine/surface layer approximation (top stitches):  Suture size:  2-0 Suture type: nylon   Stitches: simple running   Suture removal (days):  7 Hemostasis achieved with: suture and pressure Outcome: patient tolerated procedure well with no complications   Post-procedure details: sterile dressing applied and wound care instructions given   Dressing type: bandage and pressure dressing (mupirocin)    mupirocin ointment (BACTROBAN) 2 % Apply 1 Application topically daily. With dressing changes  doxycycline (VIBRA-TABS) 100 MG tablet Take 1 tablet (100 mg total) by mouth 2 (two) times daily. With food and plenty of fluid  Specimen 1 - Surgical pathology Differential Diagnosis: Biopsy proven Melanoma in situ  Check Margins: Yes [phone]  Discussed diagnosis in detail including significance of melanoma diagnosis which can be potentially lethal.  Discussed treatment recommendations in detail advising that treatment recommendations are based on longitudinal studies and retrospective studies and are nationwide protocols.  Advised there is always potential for recurrence even after definitive treatment.  After definitive treatment, we recommend total-body skin exams every 3 months for a year; then every 4 months for a year; then every 6 months for 3 years.  At 5 years post treatment, if all looks good we would recommend at least yearly total-body skin exams  for the rest of your life.  The patient was given time for questions and these were answered.  We recommend frequent self skin examinations;  photoprotection with sunscreen, sun protective clothing, hats, sunglasses and sun avoidance.  If the patient notices any new or changing skin lesions the patient should return to the office immediately for evaluation.   Psoriasis Bilateral palms Psoriasis is a chronic non-curable, but treatable genetic/hereditary disease that may have other systemic features affecting other organ systems such as joints (Psoriatic Arthritis). It is associated with an increased risk of inflammatory bowel disease, heart disease, non-alcoholic fatty liver disease, and depression.    Continue [name] cream until follow up - will re-evaluate on follow up  Return in about 1 week (around 02/17/2022) for suture removal.  I, Ashok Cordia, CMA, am acting as scribe for Sarina Ser, MD . Documentation: I have reviewed the above documentation for accuracy and completeness, and I agree with the above.  Sarina Ser, MD

## 2022-02-15 ENCOUNTER — Encounter: Payer: Self-pay | Admitting: Dermatology

## 2022-02-17 ENCOUNTER — Encounter: Payer: Self-pay | Admitting: Dermatology

## 2022-02-17 ENCOUNTER — Ambulatory Visit: Payer: 59 | Admitting: Dermatology

## 2022-02-17 DIAGNOSIS — B078 Other viral warts: Secondary | ICD-10-CM

## 2022-02-17 DIAGNOSIS — L409 Psoriasis, unspecified: Secondary | ICD-10-CM | POA: Diagnosis not present

## 2022-02-17 DIAGNOSIS — Z86006 Personal history of melanoma in-situ: Secondary | ICD-10-CM

## 2022-02-17 DIAGNOSIS — Z79899 Other long term (current) drug therapy: Secondary | ICD-10-CM | POA: Diagnosis not present

## 2022-02-17 DIAGNOSIS — B079 Viral wart, unspecified: Secondary | ICD-10-CM

## 2022-02-17 NOTE — Progress Notes (Signed)
Follow-Up Visit   Subjective  Judy Huang is a 56 y.o. female who presents for the following: Psoriasis ([name] didn't help psoriasis at all. [name] not covered by patient's insurance. Condition has worsened and now affects the hands, scalp, inframammary, and abdomen areas), Suture removal/post op (Pathology proven margins free melanoma in situ of the L post shoulder, patient is here today for suture removal), and Wart (Of the R 5th finger - previously treated with LN2 and one injection of candida antigen. Chronic and persistent).  The following portions of the chart were reviewed this encounter and updated as appropriate:   Tobacco  Allergies  Meds  Problems  Med Hx  Surg Hx  Fam Hx     Review of Systems:  No other skin or systemic complaints except as noted in HPI or Assessment and Plan.  Objective  Well appearing patient in no apparent distress; mood and affect are within normal limits.  A focused examination was performed including the face, scalp, trunk, extremities. Relevant physical exam findings are noted in the Assessment and Plan.  Scalp, hands, abdomen, inframammary Guttate plaques on the abdomen. Psoriasis inversa of the inframammary areas. Palm and scalp involvement.  R palmar middle 5th finger x 1 0.6 cm verrucous papule.  L post shoulder Healing excision site.    Assessment & Plan  Psoriasis Scalp, hands, abdomen, inframammary  Chronic and persistent condition with duration or expected duration over one year. Condition is bothersome/symptomatic for patient. Currently flared. [name] didn't help, [name] not covered by patient's insurance.   Psoriasis is a chronic non-curable, but treatable genetic/hereditary disease that may have other systemic features affecting other organ systems such as joints (Psoriatic Arthritis). It is associated with an increased risk of inflammatory bowel disease, heart disease, non-alcoholic fatty liver disease, and depression.     Start Otezla titrate up to '30mg'$  po QD (sample pack given). Side effects of Otezla (apremilast) include diarrhea, nausea, headache, upper respiratory infection, depression, and weight decrease (5-10%). It should only be taken by pregnant women after a discussion regarding risks and benefits with their doctor. Goal is control of skin condition, not cure.  The use of [place] N[place] requires long term medication management, including periodic office visits.   Viral warts, unspecified type R palmar middle 5th finger x 1  Discussed viral etiology and risk of spread.  Discussed multiple treatments may be required to clear warts.  Discussed possible post-treatment dyspigmentation and risk of recurrence.  Intralesional injection - R palmar middle 5th finger x 1 Location: R palmar middle 5th finger  Informed Consent: Discussed risks (infection, pain, bleeding, bruising, thinning of the skin, loss of skin pigment, lack of resolution, and recurrence of lesion) and benefits of the procedure, as well as the alternatives. Informed consent was obtained. Preparation: The area was prepared a standard fashion.  Procedure Details: An intralesional injection was performed with candida antigen. 0.12 cc in total were injected.  Total number of injections: 1  Plan: The patient was instructed on post-op care. Recommend OTC analgesia as needed for pain.   Destruction of lesion - R palmar middle 5th finger x 1 Complexity: simple   Destruction method: cryotherapy   Informed consent: discussed and consent obtained   Timeout:  patient name, date of birth, surgical site, and procedure verified Lesion destroyed using liquid nitrogen: Yes   Region frozen until ice ball extended beyond lesion: Yes   Outcome: patient tolerated procedure well with no complications   Post-procedure details: wound care instructions  given    History of melanoma in situ L post shoulder  Encounter for Removal of Sutures - Incision site at the  L post shoulder is clean, dry and intact - Wound cleansed, sutures removed, wound cleansed and steri strips applied.  - Discussed pathology results showing margins free melanoma in situ.  - Patient advised to keep steri-strips dry until they fall off. - Scars remodel for a full year. - Once steri-strips fall off, patient can apply over-the-counter silicone scar cream each night to help with scar remodeling if desired. - Patient advised to call with any concerns or if they notice any new or changing lesions.    Return in about 2 months (around 04/19/2022) for psoriasis/Otezla f/u; .  Luther Redo, CMA, am acting as scribe for Sarina Ser, MD . Documentation: I have reviewed the above documentation for accuracy and completeness, and I agree with the above.  Sarina Ser, MD

## 2022-02-17 NOTE — Patient Instructions (Addendum)
Side effects of Otezla (apremilast) include diarrhea, nausea, headache, upper respiratory infection, depression, and weight decrease (5-10%). It should only be taken by pregnant women after a discussion regarding risks and benefits with their doctor. Goal is control of skin condition, not cure.  The use of Otezla requires long term medication management, including periodic office visits.   Due to recent changes in healthcare laws, you may see results of your pathology and/or laboratory studies on MyChart before the doctors have had a chance to review them. We understand that in some cases there may be results that are confusing or concerning to you. Please understand that not all results are received at the same time and often the doctors may need to interpret multiple results in order to provide you with the best plan of care or course of treatment. Therefore, we ask that you please give us 2 business days to thoroughly review all your results before contacting the office for clarification. Should we see a critical lab result, you will be contacted sooner.   If You Need Anything After Your Visit  If you have any questions or concerns for your doctor, please call our main line at [phone] and press option 4 to reach your doctor's medical assistant. If no one answers, please leave a voicemail as directed and we will return your call as soon as possible. Messages left after 4 pm will be answered the following business day.   You may also send us a message via MyChart. We typically respond to MyChart messages within 1-2 business days.  For prescription refills, please ask your pharmacy to contact our office. Our fax number is [phone].  If you have an urgent issue when the clinic is closed that cannot wait until the next business day, you can page your doctor at the number below.    Please note that while we do our best to be available for urgent issues outside of office hours, we are not  available 24/7.   If you have an urgent issue and are unable to reach us, you may choose to seek medical care at your doctor's office, retail clinic, urgent care center, or emergency room.  If you have a medical emergency, please immediately call 911 or go to the emergency department.  Pager Numbers  - Dr. Kowalski: [phone]  - Dr. Moye: [phone]  - Dr. Stewart: [phone]  In the event of inclement weather, please call our main line at [phone] for an update on the status of any delays or closures.  Dermatology Medication Tips: Please keep the boxes that topical medications come in in order to help keep track of the instructions about where and how to use these. Pharmacies typically print the medication instructions only on the boxes and not directly on the medication tubes.   If your medication is too expensive, please contact our office at [phone] option 4 or send us a message through MyChart.   We are unable to tell what your co-pay for medications will be in advance as this is different depending on your insurance coverage. However, we may be able to find a substitute medication at lower cost or fill out paperwork to get insurance to cover a needed medication.   If a prior authorization is required to get your medication covered by your insurance company, please allow us 1-2 business days to complete this process.  Drug prices often vary depending on where the prescription is filled and some pharmacies may offer cheaper prices.  The   website www.goodrx.com contains coupons for medications through different pharmacies. The prices here do not account for what the cost may be with help from insurance (it may be cheaper with your insurance), but the website can give you the price if you did not use any insurance.  - You can print the associated coupon and take it with your prescription to the pharmacy.  - You may also stop by our office during regular business hours  and pick up a GoodRx coupon card.  - If you need your prescription sent electronically to a different pharmacy, notify our office through [facility] MyChart or by phone at [phone] option 4.     Si Usted Necesita Algo Despus de Su Visita  Tambin puede enviarnos un mensaje a travs de MyChart. Por lo general respondemos a los mensajes de MyChart en el transcurso de 1 a 2 das hbiles.  Para renovar recetas, por favor pida a su farmacia que se ponga en contacto con nuestra oficina. Nuestro nmero de fax es el [phone].  Si tiene un asunto urgente cuando la clnica est cerrada y que no puede esperar hasta el siguiente da hbil, puede llamar/localizar a su doctor(a) al nmero que aparece a continuacin.   Por favor, tenga en cuenta que aunque hacemos todo lo posible para estar disponibles para asuntos urgentes fuera del horario de oficina, no estamos disponibles las 24 horas del da, los 7 das de la semana.   Si tiene un problema urgente y no puede comunicarse con nosotros, puede optar por buscar atencin mdica  en el consultorio de su doctor(a), en una clnica privada, en un centro de atencin urgente o en una sala de emergencias.  Si tiene una emergencia mdica, por favor llame inmediatamente al 911 o vaya a la sala de emergencias.  Nmeros de bper  - Dr. Kowalski: [phone]  - Dra. Moye: [phone]  - Dra. Stewart: [phone]  En caso de inclemencias del tiempo, por favor llame a nuestra lnea principal al [phone] para una actualizacin sobre el estado de cualquier retraso o cierre.  Consejos para la medicacin en dermatologa: Por favor, guarde las cajas en las que vienen los medicamentos de uso tpico para ayudarle a seguir las instrucciones sobre dnde y cmo usarlos. Las farmacias generalmente imprimen las instrucciones del medicamento slo en las cajas y no directamente en los tubos del medicamento.   Si su medicamento es muy caro, por favor, pngase  en contacto con nuestra oficina llamando al [phone] y presione la opcin 4 o envenos un mensaje a travs de MyChart.   No podemos decirle cul ser su copago por los medicamentos por adelantado ya que esto es diferente dependiendo de la cobertura de su seguro. Sin embargo, es posible que podamos encontrar un medicamento sustituto a menor costo o llenar un formulario para que el seguro cubra el medicamento que se considera necesario.   Si se requiere una autorizacin previa para que su compaa de seguros cubra su medicamento, por favor permtanos de 1 a 2 das hbiles para completar este proceso.  Los precios de los medicamentos varan con frecuencia dependiendo del lugar de dnde se surte la receta y alguna farmacias pueden ofrecer precios ms baratos.  El sitio web www.goodrx.com tiene cupones para medicamentos de diferentes farmacias. Los precios aqu no tienen en cuenta lo que podra costar con la ayuda del seguro (puede ser ms barato con su seguro), pero el sitio web puede darle el precio si no utiliz ningn seguro.  - Puede   imprimir el cupn correspondiente y llevarlo con su receta a la farmacia.  - Tambin puede pasar por nuestra oficina durante el horario de atencin regular y recoger una tarjeta de cupones de GoodRx.  - Si necesita que su receta se enve electrnicamente a una farmacia diferente, informe a nuestra oficina a travs de MyChart de [place] o por telfono llamando al [phone] y presione la opcin 4.  

## 2022-02-25 ENCOUNTER — Other Ambulatory Visit: Payer: Self-pay | Admitting: Nurse Practitioner

## 2022-02-25 DIAGNOSIS — Z1231 Encounter for screening mammogram for malignant neoplasm of breast: Secondary | ICD-10-CM

## 2022-03-20 ENCOUNTER — Ambulatory Visit
Admission: RE | Admit: 2022-03-20 | Discharge: 2022-03-20 | Disposition: A | Discharge status: Home / Self Care | Payer: 59 | Source: Ambulatory Visit | Attending: Nurse Practitioner | Admitting: Nurse Practitioner

## 2022-03-20 DIAGNOSIS — Z1231 Encounter for screening mammogram for malignant neoplasm of breast: Secondary | ICD-10-CM | POA: Insufficient documentation

## 2022-03-26 ENCOUNTER — Telehealth: Payer: Self-pay | Admitting: Nurse Practitioner

## 2022-03-26 DIAGNOSIS — I10 Essential (primary) hypertension: Secondary | ICD-10-CM

## 2022-03-26 MED ORDER — HYDROCHLOROTHIAZIDE 12.5 MG PO CAPS: 12.5000 mg | ORAL_CAPSULE | Freq: Every day | ORAL | 1 refills | Status: DC

## 2022-03-26 NOTE — Telephone Encounter (Signed)
Medication sent in she needs to schedule a cpe within the next 4 months

## 2022-03-26 NOTE — Telephone Encounter (Signed)
Patient has been scheduled

## 2022-03-26 NOTE — Telephone Encounter (Signed)
  Encourage patient to contact the pharmacy for refills or they can request refills through [facility]:  Please schedule appointment if longer than 1 year  NEXT APPOINTMENT DATE:  MEDICATION:hydrochlorothiazide (MICROZIDE) 12.5 MG capsule  Is the patient out of medication?   [id number] [address]    Let patient know to contact pharmacy at the end of the day to make sure medication is ready.  Please notify patient to allow 48-72 hours to process  CLINICAL FILLS OUT ALL BELOW:   LAST REFILL:  QTY:  REFILL DATE:    OTHER COMMENTS:    Okay for refill?  Please advise

## 2022-03-26 NOTE — Telephone Encounter (Signed)
Last refill was sent to [facility]

## 2022-03-26 NOTE — Telephone Encounter (Signed)
Please schedule

## 2022-04-06 ENCOUNTER — Other Ambulatory Visit (HOSPITAL_COMMUNITY)
Admission: RE | Admit: 2022-04-06 | Discharge: 2022-04-06 | Disposition: A | Discharge status: Home / Self Care | Payer: 59 | Source: Ambulatory Visit | Attending: Obstetrics and Gynecology | Admitting: Obstetrics and Gynecology

## 2022-04-06 ENCOUNTER — Encounter: Payer: Self-pay | Admitting: Obstetrics and Gynecology

## 2022-04-06 ENCOUNTER — Ambulatory Visit (INDEPENDENT_AMBULATORY_CARE_PROVIDER_SITE_OTHER): Payer: 59 | Admitting: Obstetrics and Gynecology

## 2022-04-06 VITALS — BP 130/82 | HR 96 | Ht 65.0 in | Wt 173.0 lb

## 2022-04-06 DIAGNOSIS — Z87410 Personal history of cervical dysplasia: Secondary | ICD-10-CM

## 2022-04-06 DIAGNOSIS — Z01419 Encounter for gynecological examination (general) (routine) without abnormal findings: Secondary | ICD-10-CM

## 2022-04-06 DIAGNOSIS — Z1272 Encounter for screening for malignant neoplasm of vagina: Secondary | ICD-10-CM | POA: Insufficient documentation

## 2022-04-06 DIAGNOSIS — N951 Menopausal and female climacteric states: Secondary | ICD-10-CM

## 2022-04-06 MED ORDER — VENLAFAXINE HCL ER 37.5 MG PO CP24: 37.5000 mg | ORAL_CAPSULE | Freq: Every day | ORAL | 1 refills | Status: DC

## 2022-04-06 NOTE — Patient Instructions (Addendum)
EXERCISE AND DIET:  We recommended that you start or continue a regular exercise program for good health. Regular exercise means any activity that makes your heart beat faster and makes you sweat.  We recommend exercising at least 30 minutes per day at least 3 days a week, preferably 4 or 5.  We also recommend a diet low in fat and sugar.  Inactivity, poor dietary choices and obesity can cause diabetes, heart attack, stroke, and kidney damage, among others.    ALCOHOL AND SMOKING:  Women should limit their alcohol intake to no more than 7 drinks/beers/glasses of wine (combined, not each!) per week. Moderation of alcohol intake to this level decreases your risk of breast cancer and liver damage. And of course, no recreational drugs are part of a healthy lifestyle.  And absolutely no smoking or even second hand smoke. Most people know smoking can cause heart and lung diseases, but did you know it also contributes to weakening of your bones? Aging of your skin?  Yellowing of your teeth and nails?  CALCIUM AND VITAMIN D:  Adequate intake of calcium and Vitamin D are recommended.  The recommendations for exact amounts of these supplements seem to change often, but generally speaking 600 mg of calcium (either carbonate or citrate) and 800 units of Vitamin D per day seems prudent. Certain women may benefit from higher intake of Vitamin D.  If you are among these women, your doctor will have told you during your visit.    PAP SMEARS:  Pap smears, to check for cervical cancer or precancers,  have traditionally been done yearly, although recent scientific advances have shown that most women can have pap smears less often.  However, every woman still should have a physical exam from her gynecologist every year. It will include a breast check, inspection of the vulva and vagina to check for abnormal growths or skin changes, a visual exam of the cervix, and then an exam to evaluate the size and shape of the uterus and  ovaries.  And after 57 years of age, a rectal exam is indicated to check for rectal cancers. We will also provide age appropriate advice regarding health maintenance, like when you should have certain vaccines, screening for sexually transmitted diseases, bone density testing, colonoscopy, mammograms, etc.   MAMMOGRAMS:  All women over 40 years old should have a yearly mammogram. Many facilities now offer a "3D" mammogram, which may cost around $50 extra out of pocket. If possible,  we recommend you accept the option to have the 3D mammogram performed.  It both reduces the number of women who will be called back for extra views which then turn out to be normal, and it is better than the routine mammogram at detecting truly abnormal areas.    COLONOSCOPY:  Colonoscopy to screen for colon cancer is recommended for all women at age 50.  We know, you hate the idea of the prep.  We agree, BUT, having colon cancer and not knowing it is worse!!  Colon cancer so often starts as a polyp that can be seen and removed at colonscopy, which can quite literally save your life!  And if your first colonoscopy is normal and you have no family history of colon cancer, most women don't have to have it again for 10 years.  Once every ten years, you can do something that may end up saving your life, right?  We will be happy to help you get it scheduled when you are ready.    Be sure to check your insurance coverage so you understand how much it will cost.  It may be covered as a preventative service at no cost, but you should check your particular policy.    Venlafaxine Extended-Release Capsules What is this medication? VENLAFAXINE (VEN la fax een) treats depression and anxiety. It increases the amount of serotonin and norepinephrine in the brain, hormones that help regulate mood. It belongs to a group of medications called SNRIs. This medicine may be used for other purposes; ask your health care provider or pharmacist if you have  questions. COMMON BRAND NAME(S): Effexor XR What should I tell my care team before I take this medication? They need to know if you have any of these conditions: Bleeding disorders Glaucoma Heart disease High blood pressure High cholesterol Kidney disease Liver disease Low levels of sodium in the blood Mania or bipolar disorder Seizures Suicidal thoughts, plans, or attempt; a previous suicide attempt by you or a family Take medications that treat or prevent blood clots Thyroid disease An unusual or allergic reaction to venlafaxine, desvenlafaxine, other medications, foods, dyes, or preservatives Pregnant or trying to get pregnant Breast-feeding How should I use this medication? Take this medication by mouth with a full glass of water. Follow the directions on the prescription label. Do not cut, crush, or chew this medication. Take it with food. If needed, the capsule may be carefully opened and the entire contents sprinkled on a spoonful of cool applesauce. Swallow the applesauce/pellet mixture right away without chewing and follow with a glass of water to ensure complete swallowing of the pellets. Try to take your medication at about the same time each day. Do not take your medication more often than directed. Do not stop taking this medication suddenly except upon the advice of your care team. Stopping this medication too quickly may cause serious side effects or your condition may worsen. A special MedGuide will be given to you by the pharmacist with each prescription and refill. Be sure to read this information carefully each time. Talk to your care team regarding the use of this medication in children. Special care may be needed. Overdosage: If you think you have taken too much of this medicine contact a poison control center or emergency room at once. NOTE: This medicine is only for you. Do not share this medicine with others. What if I miss a dose? If you miss a dose, take it as  soon as you can. If it is almost time for your next dose, take only that dose. Do not take double or extra doses. What may interact with this medication? Do not take this medication with any of the following: Certain medications for fungal infections like fluconazole, itraconazole, ketoconazole, posaconazole, voriconazole Cisapride Desvenlafaxine Dronedarone Duloxetine Levomilnacipran Linezolid MAOIs like Carbex, Eldepryl, Marplan, Nardil, and Parnate Methylene blue (injected into a vein) Milnacipran Pimozide Thioridazine This medication may also interact with the following: Amphetamines Aspirin and aspirin-like medications Certain medications for depression, anxiety, or psychotic disturbances Certain medications for migraine headaches like almotriptan, eletriptan, frovatriptan, naratriptan, rizatriptan, sumatriptan, zolmitriptan Certain medications for sleep Certain medications that treat or prevent blood clots like dalteparin, enoxaparin, warfarin Cimetidine Clozapine Diuretics Fentanyl Furazolidone Indinavir Isoniazid Lithium Metoprolol NSAIDS, medications for pain and inflammation, like ibuprofen or naproxen Other medications that prolong the QT interval (cause an abnormal heart rhythm) like dofetilide, ziprasidone Procarbazine Rasagiline Supplements like St. John's wort, kava kava, valerian Tramadol Tryptophan This list may not describe all possible interactions. Give your health  care provider a list of all the medicines, herbs, non-prescription drugs, or dietary supplements you use. Also tell them if you smoke, drink alcohol, or use illegal drugs. Some items may interact with your medicine. What should I watch for while using this medication? Tell your care team if your symptoms do not get better or if they get worse. Visit your care team for regular checks on your progress. Because it may take several weeks to see the full effects of this medication, it is important to  continue your treatment as prescribed by your care team. Watch for new or worsening thoughts of suicide or depression. This includes sudden changes in mood, behaviors, or thoughts. These changes can happen at any time but are more common in the beginning of treatment or after a change in dose. Call your care team right away if you experience these thoughts or worsening depression. Manic episodes may happen in patients with bipolar disorder who take this medication. Watch for changes in feelings or behaviors such as feeling anxious, nervous, agitated, panicky, irritable, hostile, aggressive, impulsive, severely restless, overly excited and hyperactive, or trouble sleeping. These changes can happen at any time but are more common in the beginning of treatment or after a change in dose. Call your care team right away if you notice any of these symptoms. This medication can cause an increase in blood pressure. Check with your care team for instructions on monitoring your blood pressure while taking this medication. You may get drowsy or dizzy. Do not drive, use machinery, or do anything that needs mental alertness until you know how this medication affects you. Do not stand or sit up quickly, especially if you are an older patient. This reduces the risk of dizzy or fainting spells. Do not drink alcohol while taking this medication. Drinking alcohol may alter the effects of your medication. Serious side effects may occur. Your mouth may get dry. Chewing sugarless gum, sucking hard candy and drinking plenty of water will help. Contact your care team if the problem does not go away or is severe. What side effects may I notice from receiving this medication? Side effects that you should report to your care team as soon as possible: Allergic reactions--skin rash, itching, hives, swelling of the face, lips, tongue, or throat Bleeding--bloody or black, tar-like stools, red or dark brown urine, vomiting blood or brown  material that looks like coffee grounds, small, red or purple spots on skin, unusual bleeding or bruising Heart rhythm changes--fast or irregular heartbeat, dizziness, feeling faint or lightheaded, chest pain, trouble breathing Increase in blood pressure Loss of appetite with weight loss Low sodium level--muscle weakness, fatigue, dizziness, headache, confusion Serotonin syndrome--irritability, confusion, fast or irregular heartbeat, muscle stiffness, twitching muscles, sweating, high fever, seizures, chills, vomiting, diarrhea Sudden eye pain or change in vision such as blurry vision, seeing halos around lights, vision loss Thoughts of suicide or self-harm, worsening mood, feelings of depression Side effects that usually do not require medical attention (report to your care team if they continue or are bothersome): Anxiety, nervousness Change in sex drive or performance Dizziness Dry mouth Excessive sweating Nausea Tremors or shaking Trouble sleeping This list may not describe all possible side effects. Call your doctor for medical advice about side effects. You may report side effects to FDA at 1-800-FDA-1088. Where should I keep my medication? Keep out of the reach of children and pets. Store at a controlled temperature between 20 and 25 degrees C (68 degrees and 77 degrees  F), in a dry place. Throw away any unused medication after the expiration date. NOTE: This sheet is a summary. It may not cover all possible information. If you have questions about this medicine, talk to your doctor, pharmacist, or health care provider.  2023 Elsevier/Gold Standard (2020-06-26 00:00:00)

## 2022-04-06 NOTE — Progress Notes (Signed)
56 y.o. G5P2. Widowed Caucasian female here for annual exam.    States she does not feel good.  Decreased energy.  Waking up during the night.  Having hot flashes and night sweats for several years.  States they started in her late 69s.   Had total vaginal hysterectomy in 1997 due to cervical dysplasia at [facility] in [place].  Her ovaries remain.  Follow up paps were normal.   States her left breast is engorged and the right is not.   Mother of two.  Working two jobs.  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  Mother passed away.   Has a partner of 13 years.   Father had protein S deficiency.  Patient was never tested.   PCP:  Karl Ito, NP   No LMP recorded. Patient has had a hysterectomy.           Sexually active: No.  The current method of family planning is status post hysterectomy.    Exercising: Yes.     walking Smoker:  former  Health Maintenance: Pap:  years ago History of abnormal Pap:  Hx of cryotherapy years ago and states she had hysterectomy due to pre-cancerous cells on cervix MMG:  03-20-22 Neg/Birads1 Colonoscopy:  within 2-3 years;next 5 years BMD:   n/a  Result  n/a TDaP:  PCP Gardasil:   no HIV:no Hep C:no Screening Labs:  PCP   reports that she quit smoking about 7 years ago. Her smoking use included cigarettes. She started smoking about 8 years ago. She has never used smokeless tobacco. She reports that she does not drink alcohol and does not use drugs.  Past Medical History:  Diagnosis Date   Arthritis    Basal cell carcinoma 06/26/2021   left shoulder, top of deltoid - [facility] 09/15/2021   Basal cell carcinoma 08/25/2021   Left distal ant thigh - [facility]   Basal cell carcinoma 08/25/2021   Left upper back paraspinal - [facility]   Basal cell carcinoma 08/25/2021   Right infrascapular - [facility]   Basal cell carcinoma 08/25/2021   Right mid back braline -[facility]   Chicken pox    Depression    GERD (gastroesophageal reflux disease)    High blood pressure     High cholesterol    History of gastroesophageal reflux (GERD)    History of headache    History of migraine    History of UTI    Melanoma in situ ([place]) 11/24/2021   Left post shoulder - Excised 02/10/2022   Polyp of colon    PONV (postoperative nausea and vomiting)     Past Surgical History:  Procedure Laterality Date   BREAST REDUCTION SURGERY  2000   CESAREAN SECTION  1990   COLONOSCOPY WITH PROPOFOL N/A 01/22/2017   Procedure: COLONOSCOPY WITH PROPOFOL;  Surgeon: [name], MD;  Location: [facility];  Service: Endoscopy;  Laterality: N/A;   PARTIAL HYSTERECTOMY  1997   POLYPECTOMY  01/22/2017   Procedure: POLYPECTOMY;  Surgeon: [name], MD;  Location: [facility];  Service: Endoscopy;;   REDUCTION MAMMAPLASTY Bilateral 2000   BILAT   XI ROBOTIC LAPAROSCOPIC ASSISTED APPENDECTOMY N/A 02/21/2021   Procedure: XI ROBOTIC LAPAROSCOPIC ASSISTED APPENDECTOMY;  Surgeon: Herbert Pun, MD;  Location: [facility];  Service: General;  Laterality: N/A;    Current Outpatient Medications  Medication Sig Dispense Refill   albuterol (VENTOLIN HFA) 108 (90 Base) MCG/ACT inhaler Inhale 2 puffs into the lungs every 4 (four) hours as needed for  wheezing or shortness of breath (cough, shortness of breath or wheezing.). 6.7 g 3   ascorbic acid (VITAMIN C) 500 MG tablet Take 500 mg by mouth daily.     aspirin EC 81 MG tablet Take 81 mg by mouth daily.     fluticasone (FLONASE) 50 MCG/ACT nasal spray Place 2 sprays into both nostrils daily. 16 g 6   hydrochlorothiazide (MICROZIDE) 12.5 MG capsule Take 1 capsule (12.5 mg total) by mouth daily. 90 capsule 1   MAGNESIUM PO Take by mouth.     Multiple Vitamin (MULTIVITAMIN WITH MINERALS) TABS tablet Take 1 tablet by mouth daily.     mupirocin ointment (BACTROBAN) 2 % Apply 1 Application topically daily. With dressing changes 22 g 0   omeprazole (PRILOSEC) 20 MG capsule Take 1 capsule (20 mg total) by mouth daily. 90 capsule 1    OVER THE COUNTER MEDICATION Muscle cramp formula , juice plus Omega blend.     Roflumilast ([name]) 0.3 % CREA Apply 1 application. topically at bedtime. To feet and hands 60 g 1   vitamin B-12 (CYANOCOBALAMIN) 1000 MCG tablet Take 1,000 mcg by mouth daily.     No current facility-administered medications for this visit.    Family History  Problem Relation Age of Onset   Arthritis Mother    Peripheral Artery Disease Mother    Arthritis Father    Hyperlipidemia Father    Stroke Father    High blood pressure Father    Diabetes Father    Protein S deficiency Father    Protein S deficiency Sister    Hodgkin's lymphoma Maternal Uncle    Early death Maternal Uncle 68   Cancer Maternal Grandmother        intestinal   Arthritis Maternal Grandmother    Stroke Maternal Grandmother    Birth defects Maternal Grandmother    High Cholesterol Maternal Grandmother    Arthritis Maternal Grandfather    Cancer Maternal Grandfather    Early death Maternal Grandfather    Emphysema Maternal Grandfather    Cancer Paternal Grandmother    Alcohol abuse Paternal Grandmother    Heart attack Paternal Grandmother    Hearing loss Paternal Grandfather    Breast cancer Neg Hx     Review of Systems  Genitourinary:        C/o hot flashes  All other systems reviewed and are negative.   Exam:   BP 130/82   Pulse 96   Ht '5\' 5"'$  (1.651 m)   Wt 173 lb (78.5 kg)   SpO2 96%   BMI 28.79 kg/m     General appearance: alert, cooperative and appears stated age Head: normocephalic, without obvious abnormality, atraumatic Neck: no adenopathy, supple, symmetrical, trachea midline and thyroid normal to inspection and palpation Lungs: clear to auscultation bilaterally Breasts: consistent with bilateral breast reduction, no masses or tenderness, No nipple retraction or dimpling, No nipple discharge or bleeding, No axillary adenopathy Heart: regular rate and rhythm Abdomen: soft, non-tender; no masses, no  organomegaly Extremities: extremities normal, atraumatic, no cyanosis or edema Skin: skin color, texture, turgor normal. No rashes or lesions Lymph nodes: cervical, supraclavicular, and axillary nodes normal. Neurologic: grossly normal  Pelvic: External genitalia:  no lesions              No abnormal inguinal nodes palpated.              Urethra:  normal appearing urethra with no masses, tenderness or lesions  Bartholins and Skenes: normal                 Vagina: normal appearing vagina with normal color and discharge, no lesions              Cervix:  absent              Pap taken: yes Bimanual Exam:  Uterus:  absent              Adnexa: no mass, fullness, tenderness              Rectal exam: yes.  Confirms.              Anus:  normal sphincter tone, no lesions  Chaperone was present for exam:  Estill Bamberg, CMA  Assessment:   Well woman visit with gynecologic exam. Status post total vaginal hysterectomy for cervical dysplasia.  Hx cervical dysplasia.  Status post bilateral breast reduction.  Protein S deficiency in family.  Patient is untested.  Possibly may not be a candidate for estrogens.  Menopausal vasomotor symptoms.  Caregiver status.   Plan: Mammogram screening discussed. Self breast awareness reviewed. Pap and HR HPV collected.  Guidelines for Calcium, Vitamin D, regular exercise program including cardiovascular and weight bearing exercise. We discussed options to treat vasomotor symptoms: herbal remedies, SSRI/SNRI, Gabapentin, Estrogen therapy.  She will start Effexor XR 37.5 mg daily.  I dicussed potential side effects.  She will follow up in 6 weeks for recheck.  She will pursue protein S testing with her PCP to understand if she has this deficiency or not. I encouraged her to increase her circle of support in the care of her father.  Questions invited and answered. Follow up annually and prn.   After visit summary provided.

## 2022-04-13 LAB — CYTOLOGY - PAP
Comment: NEGATIVE
Diagnosis: NEGATIVE
Diagnosis: REACTIVE
High risk HPV: NEGATIVE

## 2022-04-23 ENCOUNTER — Ambulatory Visit (INDEPENDENT_AMBULATORY_CARE_PROVIDER_SITE_OTHER): Payer: 59 | Admitting: Nurse Practitioner

## 2022-04-23 ENCOUNTER — Encounter: Payer: Self-pay | Admitting: Nurse Practitioner

## 2022-04-23 VITALS — BP 122/70 | HR 80 | Temp 96.9°F | Resp 14 | Ht 65.0 in | Wt 171.4 lb

## 2022-04-23 DIAGNOSIS — I10 Essential (primary) hypertension: Secondary | ICD-10-CM

## 2022-04-23 DIAGNOSIS — K219 Gastro-esophageal reflux disease without esophagitis: Secondary | ICD-10-CM

## 2022-04-23 DIAGNOSIS — Z832 Family history of diseases of the blood and blood-forming organs and certain disorders involving the immune mechanism: Secondary | ICD-10-CM

## 2022-04-23 DIAGNOSIS — Z0000 Encounter for general adult medical examination without abnormal findings: Secondary | ICD-10-CM | POA: Diagnosis not present

## 2022-04-23 DIAGNOSIS — R7303 Prediabetes: Secondary | ICD-10-CM | POA: Diagnosis not present

## 2022-04-23 DIAGNOSIS — E663 Overweight: Secondary | ICD-10-CM | POA: Diagnosis not present

## 2022-04-23 DIAGNOSIS — E559 Vitamin D deficiency, unspecified: Secondary | ICD-10-CM

## 2022-04-23 DIAGNOSIS — R051 Acute cough: Secondary | ICD-10-CM | POA: Diagnosis not present

## 2022-04-23 MED ORDER — PREDNISONE 20 MG PO TABS: ORAL_TABLET | ORAL | 0 refills | Status: AC

## 2022-04-23 NOTE — Assessment & Plan Note (Signed)
Discussed age-appropriate immunizations and screening exams.

## 2022-04-23 NOTE — Progress Notes (Signed)
Established Patient Office Visit  Subjective   Patient ID: Judy Huang, female    DOB: 04-08-1966  Age: 56 y.o. MRN: [id number]  Chief Complaint  Patient presents with   Annual Exam    Pap smear done with GYN    HPI  HTN: States was checking but stopped because it was good. Does have cuff at home to check it as needed.  GERD: Has changed diet some.  States that she has to avoid tomato based sauces but still has them occasionally because she enjoys them.  She tolerates the omeprazole well.  No endoscopy on file.  Cough: started on Sunday. SHe is having a cough and congestion. Has been doing mucinex and nyquill at night.   for complete physical and follow up of chronic conditions.  Immunizations: -Tetanus:2019 -Influenza: refused -Shingles: Refused -Pneumonia: Too young  -HPV: Aged out  Diet: [place]. 1 meal a day with snakcing (healty snacks). Coffee in the am and water Exercise: No regular exercise. House hold activities and chores  Eye exam: Completes annually. Glasses and contacts.   Dental exam: need updating  Pap Smear: Completed in 2023 with GYN Mammogram: Completed in 03/20/2022  Colonoscopy: Completed in 2018, 10 year recall, due 2028 Lung Cancer Screening: Does not meet criteria Dexa: Too young   Protein S Def: states no history of blood clots but did have 3 miscarriages during child bearing ages.  Blood exposure: states that she was at a race and he was bleeding. She touched the blood.  Her skin was intact she did not get the blood on mucosal opening.    Review of Systems  Constitutional:  Negative for chills and fever.  HENT:  Positive for ear pain. Negative for ear discharge, sinus pain and sore throat.   Respiratory:  Positive for cough. Negative for shortness of breath.   Cardiovascular:  Negative for chest pain.  Gastrointestinal:  Negative for abdominal pain, blood in stool, constipation, diarrhea, nausea and vomiting.       BM daily    Probiotic daily   Genitourinary:  Negative for dysuria and hematuria.  Neurological:  Positive for headaches. Negative for tingling.  Psychiatric/Behavioral:  Negative for hallucinations and suicidal ideas.       Objective:     BP 122/70   Pulse 80   Temp (!) 96.9 F (36.1 C) (Temporal)   Resp 14   Ht '5\' 5"'$  (1.651 m)   Wt 171 lb 6 oz (77.7 kg)   SpO2 95%   BMI 28.52 kg/m    Physical Exam Vitals and nursing note reviewed.  Constitutional:      Appearance: Normal appearance.  HENT:     Right Ear: Tympanic membrane, ear canal and external ear normal.     Left Ear: Tympanic membrane, ear canal and external ear normal.     Mouth/Throat:     Mouth: Mucous membranes are moist.     Pharynx: Oropharynx is clear.  Cardiovascular:     Rate and Rhythm: Normal rate and regular rhythm.     Pulses: Normal pulses.     Heart sounds: Normal heart sounds.  Pulmonary:     Effort: Pulmonary effort is normal.     Breath sounds: Normal breath sounds.  Abdominal:     General: Bowel sounds are normal. There is no distension.     Palpations: There is no mass.     Tenderness: There is no abdominal tenderness.     Hernia: No hernia is  present.  Musculoskeletal:     Right lower leg: No edema.     Left lower leg: No edema.  Lymphadenopathy:     Cervical: No cervical adenopathy.  Skin:    General: Skin is warm.  Neurological:     General: No focal deficit present.     Mental Status: She is alert.     Deep Tendon Reflexes:     Reflex Scores:      Bicep reflexes are 2+ on the right side and 2+ on the left side.      Patellar reflexes are 2+ on the right side and 2+ on the left side.    Comments: Bilateral upper and lower extremity strength 5/5  Psychiatric:        Mood and Affect: Mood normal.        Behavior: Behavior normal.        Thought Content: Thought content normal.        Judgment: Judgment normal.      No results found for any visits on 04/23/22.    The 10-year  ASCVD risk score (Arnett DK, et al., 2019) is: 8.7%    Assessment & Plan:   Problem List Items Addressed This Visit       Cardiovascular and Mediastinum   Essential hypertension    Patient currently maintained on hydrochlorothiazide.  Patient is tolerating medication well blood pressure within normal limits today continue medication as prescribed      Relevant Orders   CBC   Comprehensive metabolic panel   TSH     Digestive   Gastroesophageal reflux disease    Patient currently maintained on omeprazole 20 mg daily.  Patient medication well.  No upper GI on file.  Continue omeprazole 20 mg as prescribed        Other   Vitamin D deficiency    Pending vitamin D level in office.      Relevant Orders   VITAMIN D 25 Hydroxy (Vit-D Deficiency, Fractures)   Prediabetes    Encouraged healthy lifestyle modifications.  Pending A1c in office      Relevant Orders   Hemoglobin A1c   Overweight (BMI 25.0-29.9)    Encouraged healthy lifestyle modifications.      Relevant Orders   Hemoglobin A1c   Lipid panel   Preventative health care - Primary    Discussed age-appropriate immunizations and screening exams.      Relevant Orders   CBC   Comprehensive metabolic panel   Hemoglobin A1c   TSH   Lipid panel   Acute cough    Patient with recurrent acute cough.  She did have advantageous breath sounds in office.  Do not think warrants antibiotics this time we will do prednisone 20 mg.  Prednisone precautions written down for patient      Relevant Medications   predniSONE (DELTASONE) 20 MG tablet   Family history of protein S deficiency    Family history of father and sister having protein S deficiency.  Patient's OB/GYN wanted protein S deficiency testing prior to making a decision if she can be placed on certain medications still with perimenopause.      Relevant Orders   Protein S activity    Return in about 1 year (around 04/24/2023) for CPE and labs.    Romilda Garret,  NP

## 2022-04-23 NOTE — Assessment & Plan Note (Signed)
Family history of father and sister having protein S deficiency.  Patient's OB/GYN wanted protein S deficiency testing prior to making a decision if she can be placed on certain medications still with perimenopause.

## 2022-04-23 NOTE — Patient Instructions (Addendum)
Nice to see you today I sent in steroids to the pharmacy. If you do not improve let me know Follow up with me in 1 year, sooner if you need me  Work on your exercise at home. You can start with simple walking around the neighborhood at 15 mins at at time then work up to 30 mins. Goal is 5 times a week

## 2022-04-23 NOTE — Assessment & Plan Note (Signed)
Encouraged healthy lifestyle modifications.

## 2022-04-23 NOTE — Assessment & Plan Note (Signed)
Patient currently maintained on omeprazole 20 mg daily.  Patient medication well.  No upper GI on file.  Continue omeprazole 20 mg as prescribed

## 2022-04-23 NOTE — Assessment & Plan Note (Signed)
Pending vitamin D level in office 

## 2022-04-23 NOTE — Assessment & Plan Note (Signed)
Encouraged healthy lifestyle modifications.  Pending A1c in office

## 2022-04-23 NOTE — Assessment & Plan Note (Signed)
Patient currently maintained on hydrochlorothiazide.  Patient is tolerating medication well blood pressure within normal limits today continue medication as prescribed

## 2022-04-23 NOTE — Assessment & Plan Note (Signed)
Patient with recurrent acute cough.  She did have advantageous breath sounds in office.  Do not think warrants antibiotics this time we will do prednisone 20 mg.  Prednisone precautions written down for patient

## 2022-04-29 ENCOUNTER — Other Ambulatory Visit (INDEPENDENT_AMBULATORY_CARE_PROVIDER_SITE_OTHER): Payer: 59

## 2022-04-29 DIAGNOSIS — Z832 Family history of diseases of the blood and blood-forming organs and certain disorders involving the immune mechanism: Secondary | ICD-10-CM | POA: Diagnosis not present

## 2022-04-29 DIAGNOSIS — E663 Overweight: Secondary | ICD-10-CM | POA: Diagnosis not present

## 2022-04-29 DIAGNOSIS — E785 Hyperlipidemia, unspecified: Secondary | ICD-10-CM | POA: Diagnosis not present

## 2022-04-29 DIAGNOSIS — I10 Essential (primary) hypertension: Secondary | ICD-10-CM

## 2022-04-29 DIAGNOSIS — E559 Vitamin D deficiency, unspecified: Secondary | ICD-10-CM | POA: Diagnosis not present

## 2022-04-29 DIAGNOSIS — R7303 Prediabetes: Secondary | ICD-10-CM | POA: Diagnosis not present

## 2022-04-29 DIAGNOSIS — Z0000 Encounter for general adult medical examination without abnormal findings: Secondary | ICD-10-CM | POA: Diagnosis not present

## 2022-04-29 DIAGNOSIS — Z833 Family history of diabetes mellitus: Secondary | ICD-10-CM | POA: Diagnosis not present

## 2022-04-29 DIAGNOSIS — K219 Gastro-esophageal reflux disease without esophagitis: Secondary | ICD-10-CM | POA: Diagnosis not present

## 2022-04-29 DIAGNOSIS — Z823 Family history of stroke: Secondary | ICD-10-CM | POA: Diagnosis not present

## 2022-04-29 DIAGNOSIS — Z87891 Personal history of nicotine dependence: Secondary | ICD-10-CM | POA: Diagnosis not present

## 2022-04-29 DIAGNOSIS — Z8249 Family history of ischemic heart disease and other diseases of the circulatory system: Secondary | ICD-10-CM | POA: Diagnosis not present

## 2022-04-29 DIAGNOSIS — N951 Menopausal and female climacteric states: Secondary | ICD-10-CM | POA: Diagnosis not present

## 2022-04-29 LAB — LIPID PANEL
Cholesterol: 244 mg/dL — ABNORMAL HIGH (ref 0–200)
HDL: 42.5 mg/dL (ref >39.00)
NonHDL: 201.07
Total CHOL/HDL Ratio: 6
Triglycerides: 214 mg/dL — ABNORMAL HIGH (ref 0.0–149.0)
VLDL: 42.8 mg/dL — ABNORMAL HIGH (ref 0.0–40.0)

## 2022-04-29 LAB — CBC
HCT: 40.6 % (ref 36.0–46.0)
Hemoglobin: 14 g/dL (ref 12.0–15.0)
MCHC: 34.5 g/dL (ref 30.0–36.0)
MCV: 90.4 fl (ref 78.0–100.0)
Platelets: 374 10*3/uL (ref 150.0–400.0)
RBC: 4.5 Mil/uL (ref 3.87–5.11)
RDW: 12.9 % (ref 11.5–15.5)
WBC: 10.6 10*3/uL — ABNORMAL HIGH (ref 4.0–10.5)

## 2022-04-29 LAB — COMPREHENSIVE METABOLIC PANEL
ALT: 20 U/L (ref 0–35)
AST: 14 U/L (ref 0–37)
Albumin: 4.4 g/dL (ref 3.5–5.2)
Alkaline Phosphatase: 72 U/L (ref 39–117)
BUN: 21 mg/dL (ref 6–23)
CO2: 31 mEq/L (ref 19–32)
Calcium: 10.2 mg/dL (ref 8.4–10.5)
Chloride: 101 mEq/L (ref 96–112)
Creatinine, Ser: 0.89 mg/dL (ref 0.40–1.20)
GFR: 72.08 mL/min (ref >60.00)
Glucose, Bld: 89 mg/dL (ref 70–99)
Potassium: 4 mEq/L (ref 3.5–5.1)
Sodium: 141 mEq/L (ref 135–145)
Total Bilirubin: 0.3 mg/dL (ref 0.2–1.2)
Total Protein: 7.3 g/dL (ref 6.0–8.3)

## 2022-04-29 LAB — TSH: TSH: 3.6 u[IU]/mL (ref 0.35–5.50)

## 2022-04-29 LAB — HEMOGLOBIN A1C: Hgb A1c MFr Bld: 6.2 % (ref 4.6–6.5)

## 2022-04-29 LAB — VITAMIN D 25 HYDROXY (VIT D DEFICIENCY, FRACTURES): VITD: 29.26 ng/mL — ABNORMAL LOW (ref 30.00–100.00)

## 2022-04-29 LAB — LDL CHOLESTEROL, DIRECT: Direct LDL: 174 mg/dL

## 2022-04-30 ENCOUNTER — Telehealth: Payer: Self-pay | Admitting: Nurse Practitioner

## 2022-04-30 DIAGNOSIS — E7800 Pure hypercholesterolemia, unspecified: Secondary | ICD-10-CM

## 2022-04-30 MED ORDER — ROSUVASTATIN CALCIUM 5 MG PO TABS: 5.0000 mg | ORAL_TABLET | Freq: Every day | ORAL | 1 refills | Status: DC

## 2022-04-30 NOTE — Telephone Encounter (Signed)
Pt called stating she spoke to [facility] about starting some meds for her high cholesterol & she wanted to let Charmian Muff know she wants to start the medication. Call back # [phone]

## 2022-04-30 NOTE — Telephone Encounter (Signed)
I will send in some Crestor. She will need a 3 month lipid and liver recheck. This will need to be a fasting lab. Orders have been placed

## 2022-04-30 NOTE — Telephone Encounter (Signed)
Patient advised and lab appointment made.

## 2022-05-02 LAB — PROTEIN S ACTIVITY: Protein S Activity: 101 % normal (ref 60–140)

## 2022-05-04 ENCOUNTER — Encounter: Payer: Self-pay | Admitting: Nurse Practitioner

## 2022-05-04 DIAGNOSIS — J069 Acute upper respiratory infection, unspecified: Secondary | ICD-10-CM

## 2022-05-04 MED ORDER — AZITHROMYCIN 250 MG PO TABS: ORAL_TABLET | ORAL | 0 refills | Status: AC

## 2022-05-04 MED ORDER — GUAIFENESIN-CODEINE 100-10 MG/5ML PO SOLN: 5.0000 mL | Freq: Three times a day (TID) | ORAL | 0 refills | Status: AC | PRN

## 2022-05-04 NOTE — Addendum Note (Signed)
Addended by: Michela Pitcher on: 05/04/2022 04:44 PM   Modules accepted: Orders

## 2022-05-05 ENCOUNTER — Ambulatory Visit: Payer: 59 | Admitting: Dermatology

## 2022-05-05 DIAGNOSIS — D225 Melanocytic nevi of trunk: Secondary | ICD-10-CM

## 2022-05-05 DIAGNOSIS — D485 Neoplasm of uncertain behavior of skin: Secondary | ICD-10-CM

## 2022-05-05 DIAGNOSIS — L820 Inflamed seborrheic keratosis: Secondary | ICD-10-CM | POA: Diagnosis not present

## 2022-05-05 DIAGNOSIS — B079 Viral wart, unspecified: Secondary | ICD-10-CM | POA: Diagnosis not present

## 2022-05-05 DIAGNOSIS — Z1283 Encounter for screening for malignant neoplasm of skin: Secondary | ICD-10-CM | POA: Diagnosis not present

## 2022-05-05 DIAGNOSIS — Z85828 Personal history of other malignant neoplasm of skin: Secondary | ICD-10-CM

## 2022-05-05 DIAGNOSIS — D239 Other benign neoplasm of skin, unspecified: Secondary | ICD-10-CM

## 2022-05-05 DIAGNOSIS — L578 Other skin changes due to chronic exposure to nonionizing radiation: Secondary | ICD-10-CM | POA: Diagnosis not present

## 2022-05-05 DIAGNOSIS — L409 Psoriasis, unspecified: Secondary | ICD-10-CM | POA: Diagnosis not present

## 2022-05-05 DIAGNOSIS — D229 Melanocytic nevi, unspecified: Secondary | ICD-10-CM | POA: Diagnosis not present

## 2022-05-05 DIAGNOSIS — L821 Other seborrheic keratosis: Secondary | ICD-10-CM | POA: Diagnosis not present

## 2022-05-05 DIAGNOSIS — Z85820 Personal history of malignant melanoma of skin: Secondary | ICD-10-CM | POA: Diagnosis not present

## 2022-05-05 DIAGNOSIS — L814 Other melanin hyperpigmentation: Secondary | ICD-10-CM

## 2022-05-05 HISTORY — DX: Other benign neoplasm of skin, unspecified: D23.9

## 2022-05-05 NOTE — Progress Notes (Signed)
Follow-Up Visit   Subjective  Judy Huang is a 56 y.o. female who presents for the following: Annual Exam (Hx MM, psoriasis - patient states that [place] '30mg'$  po QD didn't have any affect on her psoriasis and she is still using [name] cream samples. Wart on the R 5th finger previously treated with LN2 and Candida injections. Patient would like to discuss shave biopsy today to remove the entire lesion). The patient presents for Total-Body Skin Exam (TBSE) for skin cancer screening and mole check.  The patient has spots, moles and lesions to be evaluated, some may be new or changing.  The following portions of the chart were reviewed this encounter and updated as appropriate:   Tobacco  Allergies  Meds  Problems  Med Hx  Surg Hx  Fam Hx     Review of Systems:  No other skin or systemic complaints except as noted in HPI or Assessment and Plan.  Objective  Well appearing patient in no apparent distress; mood and affect are within normal limits.  A full examination was performed including scalp, head, eyes, ears, nose, lips, neck, chest, axillae, abdomen, back, buttocks, bilateral upper extremities, bilateral lower extremities, hands, feet, fingers, toes, fingernails, and toenails. All findings within normal limits unless otherwise noted below.  B/L hands, feet, inframammary Well-demarcated erythematous papules/plaques with silvery scale.        R palmar middle 5th finger x 1 Verrucous papules -- Discussed viral etiology and contagion.   LLQA 0.6 cm irregular brown macule.  Left Suprapubic Area Erythematous stuck-on, waxy papule or plaque   Assessment & Plan  Psoriasis B/L hands, feet, inframammary With psoriasis inversa (inframammary) -  Chronic and persistent condition with duration or expected duration over one year. Condition is bothersome/symptomatic for patient. Currently flared.   [name] didn't help, [name] not covered by patient's insurance. Reviewed labs from  04/29/22  Psoriasis is a chronic non-curable, but treatable genetic/hereditary disease that may have other systemic features affecting other organ systems such as joints (Psoriatic Arthritis). It is associated with an increased risk of inflammatory bowel disease, heart disease, non-alcoholic fatty liver disease, and depression.    Since no response to [place] recommend starting Sotyktu after receiving quanti feron test results. Will supply patient with samples until insurance approval or denial.   Continue samples   Related Procedures QuantiFERON-TB Gold Plus  Viral warts, unspecified type R palmar middle 5th finger x 1 Discussed viral etiology and risk of spread.  Discussed multiple treatments may be required to clear warts.  Discussed possible post-treatment dyspigmentation and risk of recurrence.  If not resolved or improved at follow up visit will plan shave removal.  Destruction of lesion - R palmar middle 5th finger x 1 Complexity: simple   Destruction method: cryotherapy   Informed consent: discussed and consent obtained   Timeout:  patient name, date of birth, surgical site, and procedure verified Lesion destroyed using liquid nitrogen: Yes   Region frozen until ice ball extended beyond lesion: Yes   Outcome: patient tolerated procedure well with no complications   Post-procedure details: wound care instructions given    Intralesional injection - R palmar middle 5th finger x 1 Location: R palmar middle 5th finger x 1  Informed Consent: Discussed risks (infection, pain, bleeding, bruising, thinning of the skin, loss of skin pigment, lack of resolution, and recurrence of lesion) and benefits of the procedure, as well as the alternatives. Informed consent was obtained. Preparation: The area was prepared a standard fashion.  Procedure Details: An intralesional injection was performed with candida antigen. 0.15 cc in total were injected.  Total number of injections: 1  Plan:  The patient was instructed on post-op care. Recommend OTC analgesia as needed for pain.  Neoplasm of uncertain behavior of skin LLQA Epidermal / dermal shaving  Lesion diameter (cm):  0.6 Informed consent: discussed and consent obtained   Timeout: patient name, date of birth, surgical site, and procedure verified   Patient was prepped and draped in usual sterile fashion: area prepped with alcohol. Anesthesia: the lesion was anesthetized in a standard fashion   Anesthetic:  1% lidocaine w/ epinephrine 1-100,000 buffered w/ 8.4% NaHCO3 Instrument used: flexible razor blade   Hemostasis achieved with: pressure, aluminum chloride and electrodesiccation   Outcome: patient tolerated procedure well   Post-procedure details: wound care instructions given   Post-procedure details comment:  Ointment and small bandage applied  Specimen 1 - Surgical pathology Differential Diagnosis: D48.5 r/o dysplastic nevus Check Margins: No  Inflamed seborrheic keratosis Left Suprapubic Area Symptomatic, irritating, patient would like treated.  Destruction of lesion - Left Suprapubic Area Complexity: simple   Destruction method: cryotherapy   Informed consent: discussed and consent obtained   Timeout:  patient name, date of birth, surgical site, and procedure verified Lesion destroyed using liquid nitrogen: Yes   Region frozen until ice ball extended beyond lesion: Yes   Outcome: patient tolerated procedure well with no complications   Post-procedure details: wound care instructions given    Lentigines - Scattered tan macules - Due to sun exposure - Benign-appearing, observe - Recommend daily broad spectrum sunscreen SPF 30+ to sun-exposed areas, reapply every 2 hours as needed. - Call for any changes  Seborrheic Keratoses - Stuck-on, waxy, tan-brown papules and/or plaques  - Benign-appearing - Discussed benign etiology and prognosis. - Observe - Call for any changes  Melanocytic Nevi -  Tan-brown and/or pink-flesh-colored symmetric macules and papules - Benign appearing on exam today - Observation - Call clinic for new or changing moles - Recommend daily use of broad spectrum spf 30+ sunscreen to sun-exposed areas.   Hemangiomas - Red papules - Discussed benign nature - Observe - Call for any changes  Actinic Damage - Chronic condition, secondary to cumulative UV/sun exposure - diffuse scaly erythematous macules with underlying dyspigmentation - Recommend daily broad spectrum sunscreen SPF 30+ to sun-exposed areas, reapply every 2 hours as needed.  - Staying in the shade or wearing long sleeves, sun glasses (UVA+UVB protection) and wide brim hats (4-inch brim around the entire circumference of the hat) are also recommended for sun protection.  - Call for new or changing lesions.  History of Melanoma - No evidence of recurrence today - No lymphadenopathy - Recommend regular full body skin exams - Recommend daily broad spectrum sunscreen SPF 30+ to sun-exposed areas, reapply every 2 hours as needed.  - Call if any new or changing lesions are noted between office visits  History of Basal Cell Carcinoma of the Skin - No evidence of recurrence today - Recommend regular full body skin exams - Recommend daily broad spectrum sunscreen SPF 30+ to sun-exposed areas, reapply every 2 hours as needed.  - Call if any new or changing lesions are noted between office visits  Skin cancer screening performed today.  Return in about 3 months (around 08/05/2022) for TBSE - hx MM, BCC.  Luther Redo, CMA, am acting as scribe for Sarina Ser, MD . Documentation: I have reviewed the above documentation for accuracy  and completeness, and I agree with the above.  Sarina Ser, MD

## 2022-05-05 NOTE — Patient Instructions (Signed)
Due to recent changes in healthcare laws, you may see results of your pathology and/or laboratory studies on MyChart before the doctors have had a chance to review them. We understand that in some cases there may be results that are confusing or concerning to you. Please understand that not all results are received at the same time and often the doctors may need to interpret multiple results in order to provide you with the best plan of care or course of treatment. Therefore, we ask that you please give us 2 business days to thoroughly review all your results before contacting the office for clarification. Should we see a critical lab result, you will be contacted sooner.   If You Need Anything After Your Visit  If you have any questions or concerns for your doctor, please call our main line at [phone] and press option 4 to reach your doctor's medical assistant. If no one answers, please leave a voicemail as directed and we will return your call as soon as possible. Messages left after 4 pm will be answered the following business day.   You may also send us a message via MyChart. We typically respond to MyChart messages within 1-2 business days.  For prescription refills, please ask your pharmacy to contact our office. Our fax number is [phone].  If you have an urgent issue when the clinic is closed that cannot wait until the next business day, you can page your doctor at the number below.    Please note that while we do our best to be available for urgent issues outside of office hours, we are not available 24/7.   If you have an urgent issue and are unable to reach us, you may choose to seek medical care at your doctor's office, retail clinic, urgent care center, or emergency room.  If you have a medical emergency, please immediately call 911 or go to the emergency department.  Pager Numbers  - Dr. Kowalski: [phone]  - Dr. Moye: [phone]  - Dr. Stewart:  [phone]  In the event of inclement weather, please call our main line at [phone] for an update on the status of any delays or closures.  Dermatology Medication Tips: Please keep the boxes that topical medications come in in order to help keep track of the instructions about where and how to use these. Pharmacies typically print the medication instructions only on the boxes and not directly on the medication tubes.   If your medication is too expensive, please contact our office at [phone] option 4 or send us a message through MyChart.   We are unable to tell what your co-pay for medications will be in advance as this is different depending on your insurance coverage. However, we may be able to find a substitute medication at lower cost or fill out paperwork to get insurance to cover a needed medication.   If a prior authorization is required to get your medication covered by your insurance company, please allow us 1-2 business days to complete this process.  Drug prices often vary depending on where the prescription is filled and some pharmacies may offer cheaper prices.  The website www.goodrx.com contains coupons for medications through different pharmacies. The prices here do not account for what the cost may be with help from insurance (it may be cheaper with your insurance), but the website can give you the price if you did not use any insurance.  - You can print the associated coupon and take it with   your prescription to the pharmacy.  - You may also stop by our office during regular business hours and pick up a GoodRx coupon card.  - If you need your prescription sent electronically to a different pharmacy, notify our office through [facility] MyChart or by phone at [phone] option 4.     Si Usted Necesita Algo Despus de Su Visita  Tambin puede enviarnos un mensaje a travs de MyChart. Por lo general respondemos a los mensajes de MyChart en el transcurso de 1 a 2  das hbiles.  Para renovar recetas, por favor pida a su farmacia que se ponga en contacto con nuestra oficina. Nuestro nmero de fax es el [phone].  Si tiene un asunto urgente cuando la clnica est cerrada y que no puede esperar hasta el siguiente da hbil, puede llamar/localizar a su doctor(a) al nmero que aparece a continuacin.   Por favor, tenga en cuenta que aunque hacemos todo lo posible para estar disponibles para asuntos urgentes fuera del horario de oficina, no estamos disponibles las 24 horas del da, los 7 das de la semana.   Si tiene un problema urgente y no puede comunicarse con nosotros, puede optar por buscar atencin mdica  en el consultorio de su doctor(a), en una clnica privada, en un centro de atencin urgente o en una sala de emergencias.  Si tiene una emergencia mdica, por favor llame inmediatamente al 911 o vaya a la sala de emergencias.  Nmeros de bper  - Dr. Kowalski: [phone]  - Dra. Moye: [phone]  - Dra. Stewart: [phone]  En caso de inclemencias del tiempo, por favor llame a nuestra lnea principal al [phone] para una actualizacin sobre el estado de cualquier retraso o cierre.  Consejos para la medicacin en dermatologa: Por favor, guarde las cajas en las que vienen los medicamentos de uso tpico para ayudarle a seguir las instrucciones sobre dnde y cmo usarlos. Las farmacias generalmente imprimen las instrucciones del medicamento slo en las cajas y no directamente en los tubos del medicamento.   Si su medicamento es muy caro, por favor, pngase en contacto con nuestra oficina llamando al [phone] y presione la opcin 4 o envenos un mensaje a travs de MyChart.   No podemos decirle cul ser su copago por los medicamentos por adelantado ya que esto es diferente dependiendo de la cobertura de su seguro. Sin embargo, es posible que podamos encontrar un medicamento sustituto a menor costo o llenar un formulario para que el  seguro cubra el medicamento que se considera necesario.   Si se requiere una autorizacin previa para que su compaa de seguros cubra su medicamento, por favor permtanos de 1 a 2 das hbiles para completar este proceso.  Los precios de los medicamentos varan con frecuencia dependiendo del lugar de dnde se surte la receta y alguna farmacias pueden ofrecer precios ms baratos.  El sitio web www.goodrx.com tiene cupones para medicamentos de diferentes farmacias. Los precios aqu no tienen en cuenta lo que podra costar con la ayuda del seguro (puede ser ms barato con su seguro), pero el sitio web puede darle el precio si no utiliz ningn seguro.  - Puede imprimir el cupn correspondiente y llevarlo con su receta a la farmacia.  - Tambin puede pasar por nuestra oficina durante el horario de atencin regular y recoger una tarjeta de cupones de GoodRx.  - Si necesita que su receta se enve electrnicamente a una farmacia diferente, informe a nuestra oficina a travs de MyChart de [facility]   o por telfono llamando al [phone] y presione la opcin 4.  

## 2022-05-07 LAB — QUANTIFERON-TB GOLD PLUS
QuantiFERON Mitogen Value: >10 IU/mL
QuantiFERON Nil Value: 0 IU/mL
QuantiFERON TB1 Ag Value: 0.01 IU/mL
QuantiFERON TB2 Ag Value: 0 IU/mL
QuantiFERON-TB Gold Plus: NEGATIVE

## 2022-05-11 ENCOUNTER — Telehealth: Payer: Self-pay

## 2022-05-11 NOTE — Telephone Encounter (Signed)
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

## 2022-05-11 NOTE — Telephone Encounter (Signed)
Advised pt of bx result and lab results.  Advised pt I would leave a sample of Sotyktu '6mg'$  1 po qd at the front desk for her to start (Lot [id number], exp 01/2023).  Advised pt she would d/c Otezla.  Pt does have a 49mf/u for Psoriasis and Sotyktu f/u and then a 340m/u for TBSE and we would recheck Severe dysplastic at that visit./sh

## 2022-05-15 ENCOUNTER — Encounter: Payer: Self-pay | Admitting: Dermatology

## 2022-05-25 ENCOUNTER — Ambulatory Visit: Payer: 59 | Admitting: Dermatology

## 2022-05-26 ENCOUNTER — Ambulatory Visit (INDEPENDENT_AMBULATORY_CARE_PROVIDER_SITE_OTHER): Payer: 59 | Admitting: Obstetrics and Gynecology

## 2022-05-26 ENCOUNTER — Other Ambulatory Visit: Payer: Self-pay | Admitting: Nurse Practitioner

## 2022-05-26 ENCOUNTER — Encounter: Payer: Self-pay | Admitting: Obstetrics and Gynecology

## 2022-05-26 VITALS — BP 112/78 | HR 83 | Resp 18

## 2022-05-26 DIAGNOSIS — N951 Menopausal and female climacteric states: Secondary | ICD-10-CM

## 2022-05-26 DIAGNOSIS — Z5181 Encounter for therapeutic drug level monitoring: Secondary | ICD-10-CM | POA: Diagnosis not present

## 2022-05-26 MED ORDER — PAROXETINE HCL 10 MG PO TABS: 10.0000 mg | ORAL_TABLET | Freq: Every day | ORAL | 2 refills | Status: DC

## 2022-05-26 NOTE — Progress Notes (Unsigned)
GYNECOLOGY  VISIT   HPI: 56 y.o.   Widowed  Caucasian  female   [phone] with No LMP recorded. Patient has had a hysterectomy.   here for medication follow-up, Effexor XR.    Has been on this for 3 weeks.   Started for menopausal symptoms.   Hot flashes are improved, but having increased anxiety, which is constant.  She is not liking this.   Has 6 hours of sleep, but cannot go back to sleep once she wakes up.  She had protein S testing which was negative.   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  No change in his status.   GYNECOLOGIC HISTORY: No LMP recorded. Patient has had a hysterectomy. Contraception:  Hysterectomy Menopausal hormone therapy:  Effexor-XR Last mammogram:  03/20/2022 Neg BiRads 1 Last pap smear:   04/06/22 neg, HPV neg        OB History     Gravida  5   Para  2   Term      Preterm      AB  3   Living  2      SAB  3   IAB      Ectopic      Multiple      Live Births  2              Patient Active Problem List   Diagnosis Date Noted   Family history of protein S deficiency 04/23/2022   Gastroesophageal reflux disease 04/23/2022   Upper respiratory tract infection 01/06/2022   Sinus pressure 01/06/2022   Pneumonia of both lower lobes due to infectious organism 01/06/2022   Acute cough 10/06/2021   Acute non-recurrent pansinusitis 09/29/2021   Preventative health care 08/19/2021   Eustachian tube dysfunction, bilateral 05/13/2021   PND (post-nasal drip) 05/13/2021   Productive cough 04/28/2021   Acute non-recurrent ethmoidal sinusitis 04/28/2021   Sensation of fullness in both ears 04/28/2021   Acute appendicitis with localized peritonitis 02/21/2021   Chest pressure 01/22/2021   Breast asymmetry 01/22/2021   Breast pain 01/14/2021   Encounter for screening mammogram for breast cancer 12/31/2020   Prediabetes 12/30/2020   Elevated TSH 12/30/2020   Allergic rhinitis 12/30/2020   Overweight (BMI 25.0-29.9) 12/30/2020   Chronic  cough 10/02/2020   Vitamin D deficiency 05/31/2020   Palpitations 09/06/2018   Essential hypertension 09/10/2017   HLD (hyperlipidemia) 09/06/2017   Polyp of sigmoid colon    Vasomotor symptoms due to menopause 09/10/2016   History of nonmelanoma skin cancer 10/16/2015   Anxiety and depression 05/06/2015    Past Medical History:  Diagnosis Date   Arthritis    Basal cell carcinoma 06/26/2021   left shoulder, top of deltoid - [facility] 09/15/2021   Basal cell carcinoma 08/25/2021   Left distal ant thigh - [facility]   Basal cell carcinoma 08/25/2021   Left upper back paraspinal - [facility]   Basal cell carcinoma 08/25/2021   Right infrascapular - [facility]   Basal cell carcinoma 08/25/2021   Right mid back braline -[facility]   Chicken pox    Depression    Dysplastic nevus 05/05/2022   LLQA, severe atypia, margins free but close to margin, recheck in 3 months   GERD (gastroesophageal reflux disease)    High blood pressure    High cholesterol    History of gastroesophageal reflux (GERD)    History of headache    History of migraine    History of UTI    Melanoma  in situ [facility]) 11/24/2021   Left post shoulder - Excised 02/10/2022   Polyp of colon    PONV (postoperative nausea and vomiting)     Past Surgical History:  Procedure Laterality Date   BREAST REDUCTION SURGERY  2000   CESAREAN SECTION  1990   COLONOSCOPY WITH PROPOFOL N/A 01/22/2017   Procedure: COLONOSCOPY WITH PROPOFOL;  Surgeon: [name], MD;  Location: [place];  Service: Endoscopy;  Laterality: N/A;   PARTIAL HYSTERECTOMY  1997   POLYPECTOMY  01/22/2017   Procedure: POLYPECTOMY;  Surgeon: [name], MD;  Location: [place];  Service: Endoscopy;;   REDUCTION MAMMAPLASTY Bilateral 2000   BILAT   XI ROBOTIC LAPAROSCOPIC ASSISTED APPENDECTOMY N/A 02/21/2021   Procedure: XI ROBOTIC LAPAROSCOPIC ASSISTED APPENDECTOMY;  Surgeon: Herbert Pun, MD;  Location: [facility];  Service: General;  Laterality: N/A;     Current Outpatient Medications  Medication Sig Dispense Refill   albuterol (VENTOLIN HFA) 108 (90 Base) MCG/ACT inhaler Inhale 2 puffs into the lungs every 4 (four) hours as needed for wheezing or shortness of breath (cough, shortness of breath or wheezing.). 6.7 g 3   ascorbic acid (VITAMIN C) 500 MG tablet Take 500 mg by mouth daily.     aspirin EC 81 MG tablet Take 81 mg by mouth daily.     fluticasone (FLONASE) 50 MCG/ACT nasal spray Place 2 sprays into both nostrils daily. 16 g 6   hydrochlorothiazide (MICROZIDE) 12.5 MG capsule Take 1 capsule (12.5 mg total) by mouth daily. 90 capsule 1   MAGNESIUM PO Take by mouth.     Multiple Vitamin (MULTIVITAMIN WITH MINERALS) TABS tablet Take 1 tablet by mouth daily.     omeprazole (PRILOSEC) 20 MG capsule Take 1 capsule (20 mg total) by mouth daily. 90 capsule 1   OVER THE COUNTER MEDICATION Muscle cramp formula , juice plus Omega blend.     rosuvastatin (CRESTOR) 5 MG tablet Take 1 tablet (5 mg total) by mouth daily. 90 tablet 1   vitamin B-12 (CYANOCOBALAMIN) 1000 MCG tablet Take 1,000 mcg by mouth daily.     No current facility-administered medications for this visit.     ALLERGIES: Other and Percocet [oxycodone-acetaminophen]  Family History  Problem Relation Age of Onset   Arthritis Mother    Peripheral Artery Disease Mother    Arthritis Father    Hyperlipidemia Father    Stroke Father    High blood pressure Father    Diabetes Father    Protein S deficiency Father    Protein S deficiency Sister    Hodgkin's lymphoma Maternal Uncle    Early death Maternal Uncle 58   Cancer Maternal Grandmother        intestinal   Arthritis Maternal Grandmother    Stroke Maternal Grandmother    Birth defects Maternal Grandmother    High Cholesterol Maternal Grandmother    Arthritis Maternal Grandfather    Cancer Maternal Grandfather    Early death Maternal Grandfather    Emphysema Maternal Grandfather    Cancer Paternal Grandmother     Alcohol abuse Paternal Grandmother    Heart attack Paternal Grandmother    Breast cancer Neg Hx     Social History   Socioeconomic History   Marital status: Widowed    Spouse name: Not on file   Number of children: Not on file   Years of education: Not on file   Highest education level: Not on file  Occupational History   Not on  file  Tobacco Use   Smoking status: Former    Packs/day: 0.50    Years: 20.00    Total pack years: 10.00    Types: Cigarettes    Start date: 07/26/2013    Quit date: 2016    Years since quitting: 7.8   Smokeless tobacco: Never  Vaping Use   Vaping Use: Never used  Substance and Sexual Activity   Alcohol use: No    Comment: rare   Drug use: No   Sexual activity: Not Currently    Birth control/protection: Surgical    Comment: Hyst  Other Topics Concern   Not on file  Social History Narrative   Not on file   Social Determinants of Health   Financial Resource Strain: Not on file  Food Insecurity: Not on file  Transportation Needs: Not on file  Physical Activity: Not on file  Stress: Not on file  Social Connections: Not on file  Intimate Partner Violence: Not on file    Review of Systems  Psychiatric/Behavioral:  The patient is nervous/anxious.     PHYSICAL EXAMINATION:    There were no vitals taken for this visit.    General appearance: alert, cooperative and appears stated age Head: Normocephalic, without obvious abnormality, atraumatic Neck: no adenopathy, supple, symmetrical, trachea midline and thyroid normal to inspection and palpation Lungs: clear to auscultation bilaterally Breasts: normal appearance, no masses or tenderness, No nipple retraction or dimpling, No nipple discharge or bleeding, No axillary or supraclavicular adenopathy Heart: regular rate and rhythm Abdomen: soft, non-tender, no masses,  no organomegaly Extremities: extremities normal, atraumatic, no cyanosis or edema Skin: Skin color, texture, turgor normal. No  rashes or lesions Lymph nodes: Cervical, supraclavicular, and axillary nodes normal. No abnormal inguinal nodes palpated Neurologic: Grossly normal  Pelvic: External genitalia:  no lesions              Urethra:  normal appearing urethra with no masses, tenderness or lesions              Bartholins and Skenes: normal                 Vagina: normal appearing vagina with normal color and discharge, no lesions              Cervix: no lesions                Bimanual Exam:  Uterus:  normal size, contour, position, consistency, mobility, non-tender              Adnexa: no mass, fullness, tenderness              Rectal exam: {yes no:314532}.  Confirms.              Anus:  normal sphincter tone, no lesions  Chaperone was present for exam:  ***  ASSESSMENT  Protein S deficiency in family.  Patient is untested.  Possibly may not be a candidate for estrogens.  Menopausal vasomotor symptoms.   PLAN     An After Visit Summary was printed and given to the patient.  ______ minutes face to face time of which over 50% was spent in counseling.

## 2022-05-28 NOTE — Patient Instructions (Signed)
Paroxetine Tablets What is this medication? PAROXETINE (pa ROX e teen) treats depression, anxiety, obsessive-compulsive disorder (OCD), post-traumatic stress disorder (PTSD), and premenstrual dysphoric disorder (PMDD). It increases the amount of serotonin in the brain, a hormone that helps regulate mood. It belongs to a group of medications called SSRIs. This medicine may be used for other purposes; ask your health care provider or pharmacist if you have questions. COMMON BRAND NAME(S): Paxil, Pexeva What should I tell my care team before I take this medication? They need to know if you have any of these conditions: Bipolar disorder or a family history of bipolar disorder Bleeding disorders Glaucoma Heart disease Kidney disease Liver disease Low levels of sodium in the blood Seizures Suicidal thoughts, plans, or attempt; a previous suicide attempt by you or a family member Take MAOIs like Carbex, Eldepryl, Marplan, Nardil, and Parnate Take medications that treat or prevent blood clots Thyroid disease An unusual or allergic reaction to paroxetine, other medications, foods, dyes, or preservatives Pregnant or trying to get pregnant Breast-feeding How should I use this medication? Take this medication by mouth with a glass of water. Follow the directions on the prescription label. You can take it with or without food. Take your medication at regular intervals. Do not take your medication more often than directed. Do not stop taking this medication suddenly except upon the advice of your care team. Stopping this medication too quickly may cause serious side effects or your condition may worsen. A special MedGuide will be given to you by the pharmacist with each prescription and refill. Be sure to read this information carefully each time. Talk to your care team regarding the use of this medication in children. Special care may be needed. Overdosage: If you think you have taken too much of this  medicine contact a poison control center or emergency room at once. NOTE: This medicine is only for you. Do not share this medicine with others. What if I miss a dose? If you miss a dose, take it as soon as you can. If it is almost time for your next dose, take only that dose. Do not take double or extra doses. What may interact with this medication? Do not take this medication with any of the following: Linezolid MAOIs like Carbex, Eldepryl, Marplan, Nardil, and Parnate Methylene blue (injected into a vein) Pimozide Thioridazine This medication may also interact with the following: Alcohol Amphetamines Aspirin and aspirin-like medications Atomoxetine Certain medications for depression, anxiety, or psychotic disturbances Certain medications for irregular heart beat like propafenone, flecainide, encainide, and quinidine Certain medications for migraine headache like almotriptan, eletriptan, frovatriptan, naratriptan, rizatriptan, sumatriptan, zolmitriptan Cimetidine Digoxin Diuretics Fentanyl Fosamprenavir Furazolidone Isoniazid Lithium Medications that treat or prevent blood clots like warfarin, enoxaparin, and dalteparin Medications for sleep NSAIDs, medications for pain and inflammation, like ibuprofen or naproxen Phenobarbital Phenytoin Procarbazine Rasagiline Ritonavir Supplements like St. John's wort, kava kava, valerian Tamoxifen Tramadol Tryptophan This list may not describe all possible interactions. Give your health care provider a list of all the medicines, herbs, non-prescription drugs, or dietary supplements you use. Also tell them if you smoke, drink alcohol, or use illegal drugs. Some items may interact with your medicine. What should I watch for while using this medication? Tell your care team if your symptoms do not get better or if they get worse. Visit your care team for regular checks on your progress. Because it may take several weeks to see the full  effects of this medication, it is important  to continue your treatment as prescribed by your care team. Watch for new or worsening thoughts of suicide or depression. This includes sudden changes in mood, behaviors, or thoughts. These changes can happen at any time but are more common in the beginning of treatment or after a change in dose. Call your care team right away if you experience these thoughts or worsening depression. Manic episodes may happen in patients with bipolar disorder who take this medication. Watch for changes in feelings or behaviors such as feeling anxious, nervous, agitated, panicky, irritable, hostile, aggressive, impulsive, severely restless, overly excited and hyperactive, or trouble sleeping. These changes can happen at any time but are more common in the beginning of treatment or after a change in dose. Call your care team right away if you notice any of these symptoms. You may get drowsy or dizzy. Do not drive, use machinery, or do anything that needs mental alertness until you know how this medication affects you. Do not stand or sit up quickly, especially if you are an older patient. This reduces the risk of dizzy or fainting spells. Alcohol may interfere with the effect of this medication. Avoid alcoholic drinks. Your mouth may get dry. Chewing sugarless gum or sucking hard candy, and drinking plenty of water will help. Contact your care team if the problem does not go away or is severe. What side effects may I notice from receiving this medication? Side effects that you should report to your care team as soon as possible: Allergic reactions--skin rash, itching, hives, swelling of the face, lips, tongue, or throat Bleeding--bloody or black, tar-like stools, red or dark brown urine, vomiting blood or brown material that looks like coffee grounds, small, red or purple spots on skin, unusual bleeding or bruising Heart rhythm changes--fast or irregular heartbeat, dizziness,  feeling faint or lightheaded, chest pain, trouble breathing Low sodium level--muscle weakness, fatigue, dizziness, headache, confusion Serotonin syndrome--irritability, confusion, fast or irregular heartbeat, muscle stiffness, twitching muscles, sweating, high fever, seizures, chills, vomiting, diarrhea Sudden eye pain or change in vision such as blurry vision, seeing halos around lights, vision loss Thoughts of suicide or self-harm, worsening mood, feelings of depression Side effects that usually do not require medical attention (report to your care team if they continue or are bothersome): Change in sex drive or performance Diarrhea Excessive sweating Nausea Tremors or shaking Upset stomach This list may not describe all possible side effects. Call your doctor for medical advice about side effects. You may report side effects to FDA at 1-800-FDA-1088. Where should I keep my medication? Keep out of the reach of children and pets. Store at room temperature between 15 and 30 degrees C (59 and 86 degrees F). Keep container tightly closed. Throw away any unused medication after the expiration date. NOTE: This sheet is a summary. It may not cover all possible information. If you have questions about this medicine, talk to your doctor, pharmacist, or health care provider.  2023 Elsevier/Gold Standard (2020-06-26 00:00:00)

## 2022-06-09 ENCOUNTER — Ambulatory Visit (INDEPENDENT_AMBULATORY_CARE_PROVIDER_SITE_OTHER): Payer: 59 | Admitting: Dermatology

## 2022-06-09 DIAGNOSIS — L2081 Atopic neurodermatitis: Secondary | ICD-10-CM

## 2022-06-09 DIAGNOSIS — B078 Other viral warts: Secondary | ICD-10-CM

## 2022-06-09 DIAGNOSIS — T148XXA Other injury of unspecified body region, initial encounter: Secondary | ICD-10-CM

## 2022-06-09 DIAGNOSIS — Z79899 Other long term (current) drug therapy: Secondary | ICD-10-CM | POA: Diagnosis not present

## 2022-06-09 DIAGNOSIS — S30811A Abrasion of abdominal wall, initial encounter: Secondary | ICD-10-CM | POA: Diagnosis not present

## 2022-06-09 DIAGNOSIS — L820 Inflamed seborrheic keratosis: Secondary | ICD-10-CM | POA: Diagnosis not present

## 2022-06-09 MED ORDER — ADBRY 150 MG/ML ~~LOC~~ SOSY: 600.0000 mg | PREFILLED_SYRINGE | Freq: Once | SUBCUTANEOUS | 0 refills | Status: AC

## 2022-06-09 NOTE — Patient Instructions (Addendum)
Cryotherapy Aftercare  Wash gently with soap and water everyday.   Apply Vaseline and Band-Aid daily until healed.     Due to recent changes in healthcare laws, you may see results of your pathology and/or laboratory studies on MyChart before the doctors have had a chance to review them. We understand that in some cases there may be results that are confusing or concerning to you. Please understand that not all results are received at the same time and often the doctors may need to interpret multiple results in order to provide you with the best plan of care or course of treatment. Therefore, we ask that you please give us 2 business days to thoroughly review all your results before contacting the office for clarification. Should we see a critical lab result, you will be contacted sooner.   If You Need Anything After Your Visit  If you have any questions or concerns for your doctor, please call our main line at [phone] and press option 4 to reach your doctor's medical assistant. If no one answers, please leave a voicemail as directed and we will return your call as soon as possible. Messages left after 4 pm will be answered the following business day.   You may also send us a message via MyChart. We typically respond to MyChart messages within 1-2 business days.  For prescription refills, please ask your pharmacy to contact our office. Our fax number is [phone].  If you have an urgent issue when the clinic is closed that cannot wait until the next business day, you can page your doctor at the number below.    Please note that while we do our best to be available for urgent issues outside of office hours, we are not available 24/7.   If you have an urgent issue and are unable to reach us, you may choose to seek medical care at your doctor's office, retail clinic, urgent care center, or emergency room.  If you have a medical emergency, please immediately call 911 or go to the  emergency department.  Pager Numbers  - Dr. Kowalski: [phone]  - Dr. Moye: [phone]  - Dr. Stewart: [phone]  In the event of inclement weather, please call our main line at [phone] for an update on the status of any delays or closures.  Dermatology Medication Tips: Please keep the boxes that topical medications come in in order to help keep track of the instructions about where and how to use these. Pharmacies typically print the medication instructions only on the boxes and not directly on the medication tubes.   If your medication is too expensive, please contact our office at [phone] option 4 or send us a message through MyChart.   We are unable to tell what your co-pay for medications will be in advance as this is different depending on your insurance coverage. However, we may be able to find a substitute medication at lower cost or fill out paperwork to get insurance to cover a needed medication.   If a prior authorization is required to get your medication covered by your insurance company, please allow us 1-2 business days to complete this process.  Drug prices often vary depending on where the prescription is filled and some pharmacies may offer cheaper prices.  The website www.goodrx.com contains coupons for medications through different pharmacies. The prices here do not account for what the cost may be with help from insurance (it may be cheaper with your insurance), but the website can   give you the price if you did not use any insurance.  - You can print the associated coupon and take it with your prescription to the pharmacy.  - You may also stop by our office during regular business hours and pick up a GoodRx coupon card.  - If you need your prescription sent electronically to a different pharmacy, notify our office through [facility] MyChart or by phone at [phone] option 4.     Si Usted Necesita Algo Despus de Su Visita  Tambin puede  enviarnos un mensaje a travs de MyChart. Por lo general respondemos a los mensajes de MyChart en el transcurso de 1 a 2 das hbiles.  Para renovar recetas, por favor pida a su farmacia que se ponga en contacto con nuestra oficina. Nuestro nmero de fax es el [phone].  Si tiene un asunto urgente cuando la clnica est cerrada y que no puede esperar hasta el siguiente da hbil, puede llamar/localizar a su doctor(a) al nmero que aparece a continuacin.   Por favor, tenga en cuenta que aunque hacemos todo lo posible para estar disponibles para asuntos urgentes fuera del horario de oficina, no estamos disponibles las 24 horas del da, los 7 das de la semana.   Si tiene un problema urgente y no puede comunicarse con nosotros, puede optar por buscar atencin mdica  en el consultorio de su doctor(a), en una clnica privada, en un centro de atencin urgente o en una sala de emergencias.  Si tiene una emergencia mdica, por favor llame inmediatamente al 911 o vaya a la sala de emergencias.  Nmeros de bper  - Dr. Kowalski: [phone]  - Dra. Moye: [phone]  - Dra. Stewart: [phone]  En caso de inclemencias del tiempo, por favor llame a nuestra lnea principal al [phone] para una actualizacin sobre el estado de cualquier retraso o cierre.  Consejos para la medicacin en dermatologa: Por favor, guarde las cajas en las que vienen los medicamentos de uso tpico para ayudarle a seguir las instrucciones sobre dnde y cmo usarlos. Las farmacias generalmente imprimen las instrucciones del medicamento slo en las cajas y no directamente en los tubos del medicamento.   Si su medicamento es muy caro, por favor, pngase en contacto con nuestra oficina llamando al [phone] y presione la opcin 4 o envenos un mensaje a travs de MyChart.   No podemos decirle cul ser su copago por los medicamentos por adelantado ya que esto es diferente dependiendo de la cobertura de su seguro.  Sin embargo, es posible que podamos encontrar un medicamento sustituto a menor costo o llenar un formulario para que el seguro cubra el medicamento que se considera necesario.   Si se requiere una autorizacin previa para que su compaa de seguros cubra su medicamento, por favor permtanos de 1 a 2 das hbiles para completar este proceso.  Los precios de los medicamentos varan con frecuencia dependiendo del lugar de dnde se surte la receta y alguna farmacias pueden ofrecer precios ms baratos.  El sitio web www.goodrx.com tiene cupones para medicamentos de diferentes farmacias. Los precios aqu no tienen en cuenta lo que podra costar con la ayuda del seguro (puede ser ms barato con su seguro), pero el sitio web puede darle el precio si no utiliz ningn seguro.  - Puede imprimir el cupn correspondiente y llevarlo con su receta a la farmacia.  - Tambin puede pasar por nuestra oficina durante el horario de atencin regular y recoger una tarjeta de cupones de GoodRx.  -   Si necesita que su receta se enve electrnicamente a una farmacia diferente, informe a nuestra oficina a travs de MyChart de Judy Huang o por telfono llamando al [phone] y presione la opcin 4.  

## 2022-06-09 NOTE — Progress Notes (Signed)
Follow-Up Visit   Subjective  Judy Huang is a 56 y.o. female who presents for the following: Follow-up (4 weeks f/u on psoriasis on her hands, feet and inframammary, pt stopped Sotyktu 1 week ago because it made her break out in bumps all over her body. ) and Warts (Recheck a wart on the right palmer 5th finger, past treatment LN2 and Candida injection no help. ).  The following portions of the chart were reviewed this encounter and updated as appropriate:   Tobacco  Allergies  Meds  Problems  Med Hx  Surg Hx  Fam Hx     Review of Systems:  No other skin or systemic complaints except as noted in HPI or Assessment and Plan.  Objective  Well appearing patient in no apparent distress; mood and affect are within normal limits.  A focused examination was performed including right 5th finger, abdomen, hands, feet, inframammary. Relevant physical exam findings are noted in the Assessment and Plan.  right palmer 5th finger Verrucous papules -- Discussed viral etiology and contagion.   Left Suprapubic Area Stuck-on, waxy, tan-brown papule --Discussed benign etiology and prognosis.   right abdomen Resolving pink patch   B/L hands, feet and inframammary Well-demarcated erythematous papules/plaques with silvery scale.    Assessment & Plan  Other viral warts right palmer 5th finger Viral Wart (HPV) Counseling  Discussed viral / HPV (Human Papilloma Virus) etiology and risk of spread /infectivity to other areas of body as well as to other people.  Multiple treatments and methods may be required to clear warts and it is possible treatment may not be successful.  Treatment risks include discoloration; scarring and there is still potential for wart recurrence.   Plan on shave removal at the next office visit  Inflamed seborrheic keratosis Left Suprapubic Area Persist ISK   Symptomatic, irritating, patient would like treated.  Destruction of lesion - Left Suprapubic  Area Complexity: simple   Destruction method: cryotherapy   Informed consent: discussed and consent obtained   Timeout:  patient name, date of birth, surgical site, and procedure verified Lesion destroyed using liquid nitrogen: Yes   Region frozen until ice ball extended beyond lesion: Yes   Outcome: patient tolerated procedure well with no complications   Post-procedure details: wound care instructions given    Abrasion right abdomen Cleansed skin with Puracyn spray  Applied Mupirocin ointment and bandaid   Atopic neurodermatitis B/L hands, feet and inframammary Atopic dermatitis with overlap of Psoriasis  Chronic and persistent condition with duration or expected duration over one year. Condition is bothersome/symptomatic for patient. Currently flared.    Sotyku, Otzela and [name] didn't help, [name] not covered by [employer]. Reviewed labs from 04/29/22  Atopic dermatitis (eczema) is a chronic, relapsing, pruritic condition that can significantly affect quality of life. It is often associated with allergic rhinitis and/or asthma and can require treatment with topical medications, phototherapy, or in severe cases biologic injectable medication ([name]; [name]) or Oral JAK inhibitors.  Psoriasis is a chronic non-curable, but treatable genetic/hereditary disease that may have other systemic features affecting other organ systems such as joints (Psoriatic Arthritis). It is associated with an increased risk of inflammatory bowel disease, heart disease, non-alcoholic fatty liver disease, and depression.     Tralokinumab [place]ane) is a treatment given by injection for adults with moderate-to-severe atopic dermatitis. Goal is control of skin condition, not cure. It is given as 4 injections at the first dose followed by 2 injections ever 2 weeks thereafter.  Potential side effects include allergic reaction, injection site reactions and conjunctivitis (inflammation of the eyes).  The use  of [name] requires long term medication management, including periodic office visits.   Start [name]. Loading dose today - 600 mg. Then (2) 150 mg/mL (300 mg total) injected every 2 weeks.    Injected (2) 150 mg/mL [name] into right upper arm and (2) 150 mg/mL [name] into left upper arm. Pt tolerated well.    Lot [id number] Exp 03/21/2023 NDC 01561-537-94  Tralokinumab-ldrm ([name]) 150 MG/ML SOSY - B/L hands, feet and inframammary Inject 4 mLs (600 mg total) into the skin once for 1 dose. On day 1.  History of Dysplastic Nevi-severe atypia 05/05/2022 LLQA  - No evidence of recurrence today - Recommend regular full body skin exams - Recommend daily broad spectrum sunscreen SPF 30+ to sun-exposed areas, reapply every 2 hours as needed.  - Call if any new or changing lesions are noted between office visits]  Return in about 2 weeks (around 06/23/2022) for Robertson, wart .  IMarye Round, CMA, am acting as scribe for Sarina Ser, MD .  Documentation: I have reviewed the above documentation for accuracy and completeness, and I agree with the above.  Sarina Ser, MD

## 2022-06-10 ENCOUNTER — Ambulatory Visit: Payer: 59 | Admitting: Dermatology

## 2022-06-15 ENCOUNTER — Encounter: Payer: Self-pay | Admitting: Obstetrics and Gynecology

## 2022-06-17 ENCOUNTER — Other Ambulatory Visit: Payer: Self-pay | Admitting: Obstetrics and Gynecology

## 2022-06-18 ENCOUNTER — Ambulatory Visit: Payer: 59 | Admitting: Dermatology

## 2022-06-18 NOTE — Telephone Encounter (Signed)
Seen on 05/26/2022  You prescribed 30 tablets with 2 refills. 6 week check up scheduled on 07/06/22 Note attached on Rx " requesting 90 day supply"

## 2022-06-23 NOTE — Progress Notes (Signed)
GYNECOLOGY  VISIT   HPI: 56 y.o.   Widowed  Caucasian  female   [phone] with No LMP recorded. Patient has had a hysterectomy.   here for   6 week f/u. Pt still wants to discuss medications.  She stopped Effexor due to anxiety.   She started Paxil on 05/26/22, and then stopped on 06/15/22.  States she is crying all the time since she started on the Paxil.   Having hot flashes.   Not happy.  Not  destructive to herself or others.   She was treated in the past for depression when her mother was ill.   She tested negative for protein S deficiency.   Taking care of her father and working.  Has anniversary events coming up at the end of December/begin of Jan.  Work is stressful.  GYNECOLOGIC HISTORY: No LMP recorded. Patient has had a hysterectomy. Contraception:  hysterectomy Menopausal hormone therapy:  n/a Last mammogram:  03/20/22, Breast Density Category B, BI-RADS CATEGORY 1 Negative Last pap smear:   years ago        OB History     Gravida  5   Para  2   Term      Preterm      AB  3   Living  2      SAB  3   IAB      Ectopic      Multiple      Live Births  2              Patient Active Problem List   Diagnosis Date Noted   Family history of protein S deficiency 04/23/2022   Gastroesophageal reflux disease 04/23/2022   Upper respiratory tract infection 01/06/2022   Sinus pressure 01/06/2022   Pneumonia of both lower lobes due to infectious organism 01/06/2022   Acute cough 10/06/2021   Acute non-recurrent pansinusitis 09/29/2021   Preventative health care 08/19/2021   Eustachian tube dysfunction, bilateral 05/13/2021   PND (post-nasal drip) 05/13/2021   Productive cough 04/28/2021   Acute non-recurrent ethmoidal sinusitis 04/28/2021   Sensation of fullness in both ears 04/28/2021   Acute appendicitis with localized peritonitis 02/21/2021   Chest pressure 01/22/2021   Breast asymmetry 01/22/2021   Breast pain 01/14/2021   Encounter for  screening mammogram for breast cancer 12/31/2020   Prediabetes 12/30/2020   Elevated TSH 12/30/2020   Allergic rhinitis 12/30/2020   Overweight (BMI 25.0-29.9) 12/30/2020   Chronic cough 10/02/2020   Vitamin D deficiency 05/31/2020   Palpitations 09/06/2018   Essential hypertension 09/10/2017   HLD (hyperlipidemia) 09/06/2017   Polyp of sigmoid colon    Vasomotor symptoms due to menopause 09/10/2016   History of nonmelanoma skin cancer 10/16/2015   Anxiety and depression 05/06/2015    Past Medical History:  Diagnosis Date   Arthritis    Basal cell carcinoma 06/26/2021   left shoulder, top of deltoid - [facility] 09/15/2021   Basal cell carcinoma 08/25/2021   Left distal ant thigh - [facility]   Basal cell carcinoma 08/25/2021   Left upper back paraspinal - [facility]   Basal cell carcinoma 08/25/2021   Right infrascapular - [facility]   Basal cell carcinoma 08/25/2021   Right mid back braline -[facility]   Chicken pox    Depression    Dysplastic nevus 05/05/2022   LLQA, severe atypia, margins free but close to margin, recheck in 3 months   GERD (gastroesophageal reflux disease)    High blood pressure  High cholesterol    History of gastroesophageal reflux (GERD)    History of headache    History of migraine    History of UTI    Melanoma in situ ([place]) 11/24/2021   Left post shoulder - Excised 02/10/2022   Polyp of colon    PONV (postoperative nausea and vomiting)     Past Surgical History:  Procedure Laterality Date   BREAST REDUCTION SURGERY  2000   CESAREAN SECTION  1990   COLONOSCOPY WITH PROPOFOL N/A 01/22/2017   Procedure: COLONOSCOPY WITH PROPOFOL;  Surgeon: [name], MD;  Location: [place];  Service: Endoscopy;  Laterality: N/A;   PARTIAL HYSTERECTOMY  1997   POLYPECTOMY  01/22/2017   Procedure: POLYPECTOMY;  Surgeon: [name], MD;  Location: [facility];  Service: Endoscopy;;   REDUCTION MAMMAPLASTY Bilateral 2000   BILAT   XI ROBOTIC LAPAROSCOPIC ASSISTED  APPENDECTOMY N/A 02/21/2021   Procedure: XI ROBOTIC LAPAROSCOPIC ASSISTED APPENDECTOMY;  Surgeon: Herbert Pun, MD;  Location: [facility];  Service: General;  Laterality: N/A;    Current Outpatient Medications  Medication Sig Dispense Refill   albuterol (VENTOLIN HFA) 108 (90 Base) MCG/ACT inhaler Inhale 2 puffs into the lungs every 4 (four) hours as needed for wheezing or shortness of breath (cough, shortness of breath or wheezing.). 6.7 g 3   ascorbic acid (VITAMIN C) 500 MG tablet Take 500 mg by mouth daily.     aspirin EC 81 MG tablet Take 81 mg by mouth daily.     fluticasone (FLONASE) 50 MCG/ACT nasal spray Place 2 sprays into both nostrils daily. 16 g 6   hydrochlorothiazide (MICROZIDE) 12.5 MG capsule Take 1 capsule (12.5 mg total) by mouth daily. 90 capsule 1   MAGNESIUM PO Take by mouth.     Multiple Vitamin (MULTIVITAMIN WITH MINERALS) TABS tablet Take 1 tablet by mouth daily.     omeprazole (PRILOSEC) 20 MG capsule Take 1 capsule (20 mg total) by mouth daily. 90 capsule 1   OVER THE COUNTER MEDICATION Muscle cramp formula , juice plus Omega blend.     PARoxetine (PAXIL) 10 MG tablet Take 1 tablet (10 mg total) by mouth daily. 30 tablet 2   rosuvastatin (CRESTOR) 5 MG tablet Take 1 tablet (5 mg total) by mouth daily. 90 tablet 1   Tralokinumab-ldrm ([name]) 150 MG/ML SOSY Inject 2 mLs (300 mg total) into the skin every 14 (fourteen) days. Starting on day 15 for maintenance. 4 mL 0   vitamin B-12 (CYANOCOBALAMIN) 1000 MCG tablet Take 1,000 mcg by mouth daily.     No current facility-administered medications for this visit.     ALLERGIES: Other and Percocet [oxycodone-acetaminophen]  Family History  Problem Relation Age of Onset   Arthritis Mother    Peripheral Artery Disease Mother    Arthritis Father    Hyperlipidemia Father    Stroke Father    High blood pressure Father    Diabetes Father    Protein S deficiency Father    Protein S deficiency Sister    Hodgkin's  lymphoma Maternal Uncle    Early death Maternal Uncle 79   Cancer Maternal Grandmother        intestinal   Arthritis Maternal Grandmother    Stroke Maternal Grandmother    Birth defects Maternal Grandmother    High Cholesterol Maternal Grandmother    Arthritis Maternal Grandfather    Cancer Maternal Grandfather    Early death Maternal Grandfather    Emphysema Maternal Grandfather  Cancer Paternal Grandmother    Alcohol abuse Paternal Grandmother    Heart attack Paternal Grandmother    Breast cancer Neg Hx     Social History   Socioeconomic History   Marital status: Widowed    Spouse name: Not on file   Number of children: Not on file   Years of education: Not on file   Highest education level: Not on file  Occupational History   Not on file  Tobacco Use   Smoking status: Former    Packs/day: 0.50    Years: 20.00    Total pack years: 10.00    Types: Cigarettes    Start date: 07/26/2013    Quit date: 2016    Years since quitting: 7.9   Smokeless tobacco: Never  Vaping Use   Vaping Use: Never used  Substance and Sexual Activity   Alcohol use: No    Comment: rare   Drug use: No   Sexual activity: Not Currently    Birth control/protection: Surgical    Comment: Hyst  Other Topics Concern   Not on file  Social History Narrative   Not on file   Social Determinants of Health   Financial Resource Strain: Not on file  Food Insecurity: Not on file  Transportation Needs: Not on file  Physical Activity: Not on file  Stress: Not on file  Social Connections: Not on file  Intimate Partner Violence: Not on file    Review of Systems  All other systems reviewed and are negative.   PHYSICAL EXAMINATION:    BP 122/82 (BP Location: Left Arm, Patient Position: Sitting, Cuff Size: Normal)   Pulse 94   Ht '5\' 4"'$  (1.626 m)   Wt 173 lb (78.5 kg)   SpO2 97%   BMI 29.70 kg/m     General appearance: alert, cooperative and appears stated age   ASSESSMENT  Menopausal  symptoms.  Mood change.  FH clotting disorder.  Patient has tested negative for Protein S deficiency.   PLAN  We discussed gabapentin, ERT, Veozah, and herbal options.  Risks and benefits reviewed.  She opts for gabapentin.  Will start at 100 mg and increase every night by 100 mg up to 300 mg.   Brochure on menopause to patient.  We also discussed Wellbutrin, but will not start this at this time. HO on  Counseling.  Fu in 6 weeks.    An After Visit Summary was printed and given to the patient.  34 min  total time was spent for this patient encounter, including preparation, face-to-face counseling with the patient, coordination of care, and documentation of the encounter.

## 2022-06-25 ENCOUNTER — Ambulatory Visit (INDEPENDENT_AMBULATORY_CARE_PROVIDER_SITE_OTHER): Payer: 59 | Admitting: Dermatology

## 2022-06-25 DIAGNOSIS — Z79899 Other long term (current) drug therapy: Secondary | ICD-10-CM | POA: Diagnosis not present

## 2022-06-25 DIAGNOSIS — B079 Viral wart, unspecified: Secondary | ICD-10-CM

## 2022-06-25 DIAGNOSIS — L209 Atopic dermatitis, unspecified: Secondary | ICD-10-CM | POA: Diagnosis not present

## 2022-06-25 DIAGNOSIS — D489 Neoplasm of uncertain behavior, unspecified: Secondary | ICD-10-CM

## 2022-06-25 DIAGNOSIS — L409 Psoriasis, unspecified: Secondary | ICD-10-CM

## 2022-06-25 MED ORDER — ADBRY 150 MG/ML ~~LOC~~ SOSY: 300.0000 mg | PREFILLED_SYRINGE | SUBCUTANEOUS | 0 refills | Status: DC

## 2022-06-25 MED ORDER — ADBRY 150 MG/ML ~~LOC~~ SOSY: 300.0000 mg | PREFILLED_SYRINGE | SUBCUTANEOUS | 4 refills | Status: DC

## 2022-06-25 NOTE — Patient Instructions (Addendum)
Biopsy Wound Care Instructions  Leave the original bandage on for 24 hours if possible.  If the bandage becomes soaked or soiled before that time, it is OK to remove it and examine the wound.  A small amount of post-operative bleeding is normal.  If excessive bleeding occurs, remove the bandage, place gauze over the site and apply continuous pressure (no peeking) over the area for 30 minutes. If this does not work, please call our clinic as soon as possible or page your doctor if it is after hours.   Once a day, cleanse the wound with soap and water. It is fine to shower. If a thick crust develops you may use a Q-tip dipped into dilute hydrogen peroxide (mix 1:1 with water) to dissolve it.  Hydrogen peroxide can slow the healing process, so use it only as needed.    After washing, apply petroleum jelly (Vaseline) or an antibiotic ointment if your doctor prescribed one for you, followed by a bandage.    For best healing, the wound should be covered with a layer of ointment at all times. If you are not able to keep the area covered with a bandage to hold the ointment in place, this may mean re-applying the ointment several times a day.  Continue this wound care until the wound has healed and is no longer open.   Itching and mild discomfort is normal during the healing process. However, if you develop pain or severe itching, please call our office.   If you have any discomfort, you can take Tylenol (acetaminophen) or ibuprofen as directed on the bottle. (Please do not take these if you have an allergy to them or cannot take them for another reason).  Some redness, tenderness and white or yellow material in the wound is normal healing.  If the area becomes very sore and red, or develops a thick yellow-green material (pus), it may be infected; please notify us.    If you have stitches, return to clinic as directed to have the stitches removed. You will continue wound care for 2-3 days after the stitches  are removed.   Wound healing continues for up to one year following surgery. It is not unusual to experience pain in the scar from time to time during the interval.  If the pain becomes severe or the scar thickens, you should notify the office.    A slight amount of redness in a scar is expected for the first six months.  After six months, the redness will fade and the scar will soften and fade.  The color difference becomes less noticeable with time.  If there are any problems, return for a post-op surgery check at your earliest convenience.  To improve the appearance of the scar, you can use silicone scar gel, cream, or sheets (such as Mederma or Serica) every night for up to one year. These are available over the counter (without a prescription).  Please call our office at [phone] for any questions or concerns.     Due to recent changes in healthcare laws, you may see results of your pathology and/or laboratory studies on MyChart before the doctors have had a chance to review them. We understand that in some cases there may be results that are confusing or concerning to you. Please understand that not all results are received at the same time and often the doctors may need to interpret multiple results in order to provide you with the best plan of care or course   of treatment. Therefore, we ask that you please give us 2 business days to thoroughly review all your results before contacting the office for clarification. Should we see a critical lab result, you will be contacted sooner.   If You Need Anything After Your Visit  If you have any questions or concerns for your doctor, please call our main line at [phone] and press option 4 to reach your doctor's medical assistant. If no one answers, please leave a voicemail as directed and we will return your call as soon as possible. Messages left after 4 pm will be answered the following business day.   You may also send us a message via  MyChart. We typically respond to MyChart messages within 1-2 business days.  For prescription refills, please ask your pharmacy to contact our office. Our fax number is [phone].  If you have an urgent issue when the clinic is closed that cannot wait until the next business day, you can page your doctor at the number below.    Please note that while we do our best to be available for urgent issues outside of office hours, we are not available 24/7.   If you have an urgent issue and are unable to reach us, you may choose to seek medical care at your doctor's office, retail clinic, urgent care center, or emergency room.  If you have a medical emergency, please immediately call 911 or go to the emergency department.  Pager Numbers  - Dr. Kowalski: [phone]  - Dr. Moye: [phone]  - Dr. Stewart: [phone]  In the event of inclement weather, please call our main line at [phone] for an update on the status of any delays or closures.  Dermatology Medication Tips: Please keep the boxes that topical medications come in in order to help keep track of the instructions about where and how to use these. Pharmacies typically print the medication instructions only on the boxes and not directly on the medication tubes.   If your medication is too expensive, please contact our office at [phone] option 4 or send us a message through MyChart.   We are unable to tell what your co-pay for medications will be in advance as this is different depending on your insurance coverage. However, we may be able to find a substitute medication at lower cost or fill out paperwork to get insurance to cover a needed medication.   If a prior authorization is required to get your medication covered by your insurance company, please allow us 1-2 business days to complete this process.  Drug prices often vary depending on where the prescription is filled and some pharmacies may offer cheaper  prices.  The website www.goodrx.com contains coupons for medications through different pharmacies. The prices here do not account for what the cost may be with help from insurance (it may be cheaper with your insurance), but the website can give you the price if you did not use any insurance.  - You can print the associated coupon and take it with your prescription to the pharmacy.  - You may also stop by our office during regular business hours and pick up a GoodRx coupon card.  - If you need your prescription sent electronically to a different pharmacy, notify our office through [facility] MyChart or by phone at [phone] option 4.     Si Usted Necesita Algo Despus de Su Visita  Tambin puede enviarnos un mensaje a travs de MyChart. Por lo general respondemos a los   mensajes de MyChart en el transcurso de 1 a 2 das hbiles.  Para renovar recetas, por favor pida a su farmacia que se ponga en contacto con nuestra oficina. Nuestro nmero de fax es el [phone].  Si tiene un asunto urgente cuando la clnica est cerrada y que no puede esperar hasta el siguiente da hbil, puede llamar/localizar a su doctor(a) al nmero que aparece a continuacin.   Por favor, tenga en cuenta que aunque hacemos todo lo posible para estar disponibles para asuntos urgentes fuera del horario de oficina, no estamos disponibles las 24 horas del da, los 7 das de la semana.   Si tiene un problema urgente y no puede comunicarse con nosotros, puede optar por buscar atencin mdica  en el consultorio de su doctor(a), en una clnica privada, en un centro de atencin urgente o en una sala de emergencias.  Si tiene una emergencia mdica, por favor llame inmediatamente al 911 o vaya a la sala de emergencias.  Nmeros de bper  - Dr. Kowalski: [phone]  - Dra. Moye: [phone]  - Dra. Stewart: [phone]  En caso de inclemencias del tiempo, por favor llame a nuestra lnea principal al [phone]  para una actualizacin sobre el estado de cualquier retraso o cierre.  Consejos para la medicacin en dermatologa: Por favor, guarde las cajas en las que vienen los medicamentos de uso tpico para ayudarle a seguir las instrucciones sobre dnde y cmo usarlos. Las farmacias generalmente imprimen las instrucciones del medicamento slo en las cajas y no directamente en los tubos del medicamento.   Si su medicamento es muy caro, por favor, pngase en contacto con nuestra oficina llamando al [phone] y presione la opcin 4 o envenos un mensaje a travs de MyChart.   No podemos decirle cul ser su copago por los medicamentos por adelantado ya que esto es diferente dependiendo de la cobertura de su seguro. Sin embargo, es posible que podamos encontrar un medicamento sustituto a menor costo o llenar un formulario para que el seguro cubra el medicamento que se considera necesario.   Si se requiere una autorizacin previa para que su compaa de seguros cubra su medicamento, por favor permtanos de 1 a 2 das hbiles para completar este proceso.  Los precios de los medicamentos varan con frecuencia dependiendo del lugar de dnde se surte la receta y alguna farmacias pueden ofrecer precios ms baratos.  El sitio web www.goodrx.com tiene cupones para medicamentos de diferentes farmacias. Los precios aqu no tienen en cuenta lo que podra costar con la ayuda del seguro (puede ser ms barato con su seguro), pero el sitio web puede darle el precio si no utiliz ningn seguro.  - Puede imprimir el cupn correspondiente y llevarlo con su receta a la farmacia.  - Tambin puede pasar por nuestra oficina durante el horario de atencin regular y recoger una tarjeta de cupones de GoodRx.  - Si necesita que su receta se enve electrnicamente a una farmacia diferente, informe a nuestra oficina a travs de MyChart de [place] o por telfono llamando al [phone] y presione la opcin 4.  

## 2022-06-25 NOTE — Progress Notes (Signed)
Follow-Up Visit   Subjective  Judy Huang is a 56 y.o. female who presents for the following: Psoriasis (Patient here for follow up on psoriasis / atopic derm at hands. Reports has improved since [name] injections started a month ago. ) and Other (Patient also here for follow up on growth at right 5th finger she would like removed. ).  The following portions of the chart were reviewed this encounter and updated as appropriate:  Tobacco  Allergies  Meds  Problems  Med Hx  Surg Hx  Fam Hx     Review of Systems: No other skin or systemic complaints except as noted in HPI or Assessment and Plan.  Objective  Well appearing patient in no apparent distress; mood and affect are within normal limits.  A focused examination was performed including right 5th finger, b/l hands. Relevant physical exam findings are noted in the Assessment and Plan.  right 5th finger 0.7 cm pink papule      Assessment & Plan  Atopic dermatitis, B/L hands, feet and inframammary Atopic dermatitis with overlap of Psoriasis    Chronic and persistent condition with duration or expected duration over one year. Condition is bothersome/symptomatic for patient.   Improved. but not at goal.    Judy Huang and Judy Huang didn't help, Judy Huang not covered by patient's insurance.    Patient started on [name] loading dose last month 05/2022 Atopic dermatitis (eczema) is a chronic, relapsing, pruritic condition that can significantly affect quality of life. It is often associated with allergic rhinitis and/or asthma and can require treatment with topical medications, phototherapy, or in severe cases biologic injectable medication ([name]; [name]) or Oral JAK inhibitors.  Psoriasis is a chronic non-curable, but treatable genetic/hereditary disease that may have other systemic features affecting other organ systems such as joints (Psoriatic Arthritis). It is associated with an increased risk of inflammatory bowel disease,  heart disease, non-alcoholic fatty liver disease, and depression.      Judy Huang) is a treatment given by injection for adults with moderate-to-severe atopic dermatitis. Goal is control of skin condition, not cure. It is given as 4 injections at the first dose followed by 2 injections ever 2 weeks thereafter.   Potential side effects include allergic reaction, injection site reactions and conjunctivitis (inflammation of the eyes).  The use of [name] requires long term medication management, including periodic office visits.    Continue [name].  (2) 150 mg/mL (300 mg total) inject every 2 weeks.    Injected (2) 150 mg/mL [name] into right upper arm and left upper arm. Pt tolerated well.    [place] 16109-604-54 Lot [id number] Exp 03/2023  Judy-ldrm ([name]) 150 MG/ML SOSY - B/L hands, feet and inframammary Inject 2 mLs (300 mg total) into the skin every 14 (fourteen) days. Starting on day 15 for maintenance. Judy-ldrm ([name]) 150 MG/ML SOSY - B/L hands, feet and inframammary Inject 2 mLs (300 mg total) into the skin every 14 (fourteen) days. Starting on day 15 for maintenance.  Neoplasm of uncertain behavior right 5th finger Epidermal / dermal shaving  Lesion diameter (cm):  0.7 Informed consent: discussed and consent obtained   Timeout: patient name, date of birth, surgical site, and procedure verified   Procedure prep:  Patient was prepped and draped in usual sterile fashion Prep type:  Isopropyl alcohol Anesthesia: the lesion was anesthetized in a standard fashion   Anesthetic:  1% lidocaine w/ epinephrine 1-100,000 buffered w/ 8.4% NaHCO3 Instrument used: flexible razor blade   Hemostasis achieved  with: pressure, aluminum chloride and electrodesiccation   Outcome: patient tolerated procedure well   Post-procedure details: sterile dressing applied and wound care instructions given   Dressing type: bandage and petrolatum    Specimen 1 - Surgical  pathology Differential Diagnosis: Wart vs cyst Check Margins: No Wart vs cyst  Return for 2 week adbry injection and follow up on right 5th finger.  Judy Huang, Judy Huang, am acting as scribe for Judy Ser, MD. Documentation: I have reviewed the above documentation for accuracy and completeness, and I agree with the above.  Judy Ser, MD

## 2022-06-28 ENCOUNTER — Encounter: Payer: Self-pay | Admitting: Dermatology

## 2022-07-05 ENCOUNTER — Encounter: Payer: Self-pay | Admitting: Dermatology

## 2022-07-05 MED ORDER — ADBRY 150 MG/ML ~~LOC~~ SOSY: 300.0000 mg | PREFILLED_SYRINGE | SUBCUTANEOUS | 0 refills | Status: DC

## 2022-07-06 ENCOUNTER — Ambulatory Visit (INDEPENDENT_AMBULATORY_CARE_PROVIDER_SITE_OTHER): Payer: 59 | Admitting: Obstetrics and Gynecology

## 2022-07-06 ENCOUNTER — Other Ambulatory Visit: Payer: Self-pay | Admitting: Nurse Practitioner

## 2022-07-06 ENCOUNTER — Telehealth: Payer: Self-pay | Admitting: Nurse Practitioner

## 2022-07-06 ENCOUNTER — Encounter: Payer: Self-pay | Admitting: Obstetrics and Gynecology

## 2022-07-06 VITALS — BP 122/82 | HR 94 | Ht 64.0 in | Wt 173.0 lb

## 2022-07-06 DIAGNOSIS — R69 Illness, unspecified: Secondary | ICD-10-CM | POA: Diagnosis not present

## 2022-07-06 DIAGNOSIS — N951 Menopausal and female climacteric states: Secondary | ICD-10-CM

## 2022-07-06 DIAGNOSIS — H938X3 Other specified disorders of ear, bilateral: Secondary | ICD-10-CM

## 2022-07-06 DIAGNOSIS — R4586 Emotional lability: Secondary | ICD-10-CM | POA: Diagnosis not present

## 2022-07-06 MED ORDER — FLUTICASONE PROPIONATE 50 MCG/ACT NA SUSP: 2.0000 | Freq: Every day | NASAL | 6 refills | Status: DC

## 2022-07-06 MED ORDER — GABAPENTIN 100 MG PO CAPS: ORAL_CAPSULE | ORAL | 1 refills | Status: DC

## 2022-07-06 NOTE — Telephone Encounter (Signed)
Medication sent to the requested pharmacy

## 2022-07-06 NOTE — Patient Instructions (Signed)
Gabapentin Capsules or Tablets What is this medication? GABAPENTIN (GA ba pen tin) treats nerve pain. It may also be used to prevent and control seizures in people with epilepsy. It works by calming overactive nerves in your body. This medicine may be used for other purposes; ask your health care provider or pharmacist if you have questions. COMMON BRAND NAME(S): Active-PAC with Gabapentin, Gabarone, Gralise, Neurontin What should I tell my care team before I take this medication? They need to know if you have any of these conditions: Alcohol or substance use disorder Kidney disease Lung or breathing disease Suicidal thoughts, plans, or attempt; a previous suicide attempt by you or a family member An unusual or allergic reaction to gabapentin, other medications, foods, dyes, or preservatives Pregnant or trying to get pregnant Breast-feeding How should I use this medication? Take this medication by mouth with a glass of water. Follow the directions on the prescription label. You can take it with or without food. If it upsets your stomach, take it with food. Take your medication at regular intervals. Do not take it more often than directed. Do not stop taking except on your care team's advice. If you are directed to break the 600 or 800 mg tablets in half as part of your dose, the extra half tablet should be used for the next dose. If you have not used the extra half tablet within 28 days, it should be thrown away. A special MedGuide will be given to you by the pharmacist with each prescription and refill. Be sure to read this information carefully each time. Talk to your care team about the use of this medication in children. While this medication may be prescribed for children as young as 3 years for selected conditions, precautions do apply. Overdosage: If you think you have taken too much of this medicine contact a poison control center or emergency room at once. NOTE: This medicine is only for  you. Do not share this medicine with others. What if I miss a dose? If you miss a dose, take it as soon as you can. If it is almost time for your next dose, take only that dose. Do not take double or extra doses. What may interact with this medication? Alcohol Antihistamines for allergy, cough, and cold Certain medications for anxiety or sleep Certain medications for depression like amitriptyline, fluoxetine, sertraline Certain medications for seizures like phenobarbital, primidone Certain medications for stomach problems General anesthetics like halothane, isoflurane, methoxyflurane, propofol Local anesthetics like lidocaine, pramoxine, tetracaine Medications that relax muscles for surgery Opioid medications for pain Phenothiazines like chlorpromazine, mesoridazine, prochlorperazine, thioridazine This list may not describe all possible interactions. Give your health care provider a list of all the medicines, herbs, non-prescription drugs, or dietary supplements you use. Also tell them if you smoke, drink alcohol, or use illegal drugs. Some items may interact with your medicine. What should I watch for while using this medication? Visit your care team for regular checks on your progress. You may want to keep a record at home of how you feel your condition is responding to treatment. You may want to share this information with your care team at each visit. You should contact your care team if your seizures get worse or if you have any new types of seizures. Do not stop taking this medication or any of your seizure medications unless instructed by your care team. Stopping your medication suddenly can increase your seizures or their severity. This medication may cause serious skin   reactions. They can happen weeks to months after starting the medication. Contact your care team right away if you notice fevers or flu-like symptoms with a rash. The rash may be red or purple and then turn into blisters or  peeling of the skin. Or, you might notice a red rash with swelling of the face, lips or lymph nodes in your neck or under your arms. Wear a medical identification bracelet or chain if you are taking this medication for seizures. Carry a card that lists all your medications. This medication may affect your coordination, reaction time, or judgment. Do not drive or operate machinery until you know how this medication affects you. Sit up or stand slowly to reduce the risk of dizzy or fainting spells. Drinking alcohol with this medication can increase the risk of these side effects. Your mouth may get dry. Chewing sugarless gum or sucking hard candy, and drinking plenty of water may help. Watch for new or worsening thoughts of suicide or depression. This includes sudden changes in mood, behaviors, or thoughts. These changes can happen at any time but are more common in the beginning of treatment or after a change in dose. Call your care team right away if you experience these thoughts or worsening depression. If you become pregnant while using this medication, you may enroll in the [place] American Antiepileptic Drug Pregnancy Registry by calling 1-888-233-2334. This registry collects information about the safety of antiepileptic medication use during pregnancy. What side effects may I notice from receiving this medication? Side effects that you should report to your care team as soon as possible: Allergic reactions or angioedema--skin rash, itching, hives, swelling of the face, eyes, lips, tongue, arms, or legs, trouble swallowing or breathing Rash, fever, and swollen lymph nodes Thoughts of suicide or self harm, worsening mood, feelings of depression Trouble breathing Unusual changes in mood or behavior in children after use such as difficulty concentrating, hostility, or restlessness Side effects that usually do not require medical attention (report to your care team if they continue or are  bothersome): Dizziness Drowsiness Nausea Swelling of ankles, feet, or hands Vomiting This list may not describe all possible side effects. Call your doctor for medical advice about side effects. You may report side effects to FDA at 1-800-FDA-1088. Where should I keep my medication? Keep out of reach of children and pets. Store at room temperature between 15 and 30 degrees C (59 and 86 degrees F). Get rid of any unused medication after the expiration date. This medication may cause accidental overdose and death if taken by other adults, children, or pets. To get rid of medications that are no longer needed or have expired: Take the medication to a medication take-back program. Check with your pharmacy or law enforcement to find a location. If you cannot return the medication, check the label or package insert to see if the medication should be thrown out in the garbage or flushed down the toilet. If you are not sure, ask your care team. If it is safe to put it in the trash, empty the medication out of the container. Mix the medication with cat litter, dirt, coffee grounds, or other unwanted substance. Seal the mixture in a bag or container. Put it in the trash. NOTE: This sheet is a summary. It may not cover all possible information. If you have questions about this medicine, talk to your doctor, pharmacist, or health care provider.  2023 Elsevier/Gold Standard (2021-01-06 00:00:00)  

## 2022-07-06 NOTE — Telephone Encounter (Signed)
Patient would like to know if medicationfluticasone [facility]) 50 MCG/ACT nasal spray  can be called in to [id number] [place] [place] [address]: [phone] Fax: [phone]    ? [facility] [address] [place]   ,however she doesn't use that pharmacy anymore.

## 2022-07-09 ENCOUNTER — Ambulatory Visit (INDEPENDENT_AMBULATORY_CARE_PROVIDER_SITE_OTHER): Payer: 59 | Admitting: Dermatology

## 2022-07-09 ENCOUNTER — Telehealth: Payer: Self-pay

## 2022-07-09 ENCOUNTER — Encounter: Payer: Self-pay | Admitting: Dermatology

## 2022-07-09 VITALS — BP 122/76 | HR 74

## 2022-07-09 DIAGNOSIS — Z79899 Other long term (current) drug therapy: Secondary | ICD-10-CM

## 2022-07-09 DIAGNOSIS — L209 Atopic dermatitis, unspecified: Secondary | ICD-10-CM | POA: Diagnosis not present

## 2022-07-09 DIAGNOSIS — Z5189 Encounter for other specified aftercare: Secondary | ICD-10-CM | POA: Diagnosis not present

## 2022-07-09 MED ORDER — ADBRY 150 MG/ML ~~LOC~~ SOSY: 300.0000 mg | PREFILLED_SYRINGE | SUBCUTANEOUS | 0 refills | Status: AC

## 2022-07-09 NOTE — Progress Notes (Signed)
   Follow-Up Visit   Subjective  Judy Huang is a 56 y.o. female who presents for the following: Eczema (Hands, feet, inframammary. Improving on [name]. Started 06/09/2022. Pleased with results on skin. C/O joint pain in elbows since starting [name]). The patient has spots, moles and lesions to be evaluated, some may be new or changing and the patient has concerns that these could be cancer.  The following portions of the chart were reviewed this encounter and updated as appropriate:  Tobacco  Allergies  Meds  Problems  Med Hx  Surg Hx  Fam Hx     Review of Systems: No other skin or systemic complaints except as noted in HPI or Assessment and Plan.  Objective  Well appearing patient in no apparent distress; mood and affect are within normal limits.  A focused examination was performed including hands. Relevant physical exam findings are noted in the Assessment and Plan.  B/L Hands, feet, inframammary Mild xerosis at hands  Left Palmar Middle 5th Finger Ulcerated healing biopsy site   Assessment & Plan  Atopic dermatitis,  B/L Hands, feet, inframammary Atopic dermatitis with overlap of Psoriasis    Chronic and persistent condition with duration or expected duration over one year. Condition is bothersome/symptomatic for patient.  Improved. but not at goal.    [name] and [name] didn't help, [name] not covered by patient's insurance.    Patient started on [name] loading dose last month 05/2022. She has noticed some joint pain in elbows, but wants to continue [name].  If joint pain persists or worsens we will discuss whether she wants to continue [name]. Atopic dermatitis (eczema) is a chronic, relapsing, pruritic condition that can significantly affect quality of life. It is often associated with allergic rhinitis and/or asthma and can require treatment with topical medications, phototherapy, or in severe cases biologic injectable medication ([name]; [name]) or Oral JAK  inhibitors.  Psoriasis is a chronic non-curable, but treatable genetic/hereditary disease that may have other systemic features affecting other organ systems such as joints (Psoriatic Arthritis). It is associated with an increased risk of inflammatory bowel disease, heart disease, non-alcoholic fatty liver disease, and depression.      Tralokinumab [place]al Plane) is a treatment given by injection for adults with moderate-to-severe atopic dermatitis. Goal is control of skin condition, not cure. It is given as 4 injections at the first dose followed by 2 injections ever 2 weeks thereafter.   Potential side effects include allergic reaction, injection site reactions and conjunctivitis (inflammation of the eyes).  The use of [name] requires long term medication management, including periodic office visits.    Continue [name].  (2) 150 mg/mL (300 mg total) inject every 2 weeks.  Injected (2) 150 mg/mL [name] into right upper arm and left upper arm. Pt tolerated well.    [place] 88416-606-30 Lot [id number] Exp 03/2023  Related Medications Tralokinumab-ldrm ([name]) 150 MG/ML SOSY Inject 2 mLs (300 mg total) into the skin every 14 (fourteen) days. Starting on day 15 for maintenance.  Visit for wound check where wart removed in past visit Left Palmar Middle 5th Finger Continue wound care as directed. No evidence of infection.  Healing as expected.  Return in about 2 weeks (around 07/23/2022) for Atopic Dermatitis, [name] injection.  I, Emelia Salisbury, CMA, am acting as scribe for Sarina Ser, MD. Documentation: I have reviewed the above documentation for accuracy and completeness, and I agree with the above.  Sarina Ser, MD

## 2022-07-09 NOTE — Patient Instructions (Addendum)
Continue [name] every 2 weeks.   Due to recent changes in healthcare laws, you may see results of your pathology and/or laboratory studies on MyChart before the doctors have had a chance to review them. We understand that in some cases there may be results that are confusing or concerning to you. Please understand that not all results are received at the same time and often the doctors may need to interpret multiple results in order to provide you with the best plan of care or course of treatment. Therefore, we ask that you please give [name] 2 business days to thoroughly review all your results before contacting the office for clarification. Should we see a critical lab result, you will be contacted sooner.   If You Need Anything After Your Visit  If you have any questions or concerns for your doctor, please call our main line at [phone] and press option 4 to reach your doctor's medical assistant. If no one answers, please leave a voicemail as directed and we will return your call as soon as possible. Messages left after 4 pm will be answered the following business day.   You may also send [name] a message via [place]. We typically respond to MyChart messages within 1-2 business days.  For prescription refills, please ask your pharmacy to contact our office. Our fax number is [phone].  If you have an urgent issue when the clinic is closed that cannot wait until the next business day, you can page your doctor at the number below.    Please note that while we do our best to be available for urgent issues outside of office hours, we are not available 24/7.   If you have an urgent issue and are unable to reach [name], you may choose to seek medical care at your doctor's office, retail clinic, urgent care center, or emergency room.  If you have a medical emergency, please immediately call 911 or go to the emergency department.  Pager Numbers  - Dr. Nehemiah Massed: [phone]  - Dr. Laurence Ferrari:  [phone]  - Dr. Nicole Kindred: [phone]  In the event of inclement weather, please call our main line at [phone] for an update on the status of any delays or closures.  Dermatology Medication Tips: Please keep the boxes that topical medications come in in order to help keep track of the instructions about where and how to use these. Pharmacies typically print the medication instructions only on the boxes and not directly on the medication tubes.   If your medication is too expensive, please contact our office at [phone] option 4 or send [name] a message through [place].   We are unable to tell what your co-pay for medications will be in advance as this is different depending on your insurance coverage. However, we may be able to find a substitute medication at lower cost or fill out paperwork to get insurance to cover a needed medication.   If a prior authorization is required to get your medication covered by your insurance company, please allow [name] 1-2 business days to complete this process.  Drug prices often vary depending on where the prescription is filled and some pharmacies may offer cheaper prices.  The website www.goodrx.com contains coupons for medications through different pharmacies. The prices here do not account for what the cost may be with help from insurance (it may be cheaper with your insurance), but the website can give you the price if you did not use any insurance.  - You can print  the associated coupon and take it with your prescription to the pharmacy.  - You may also stop by our office during regular business hours and pick up a GoodRx coupon card.  - If you need your prescription sent electronically to a different pharmacy, notify our office through [facility] or by phone at [phone] option 4.     Si Usted Necesita Algo Despus de Su Visita  Tambin puede enviarnos un mensaje a travs de Pharmacist, community. Por lo general respondemos a los mensajes de  MyChart en el transcurso de 1 a 2 das hbiles.  Para renovar recetas, por favor pida a su farmacia que se ponga en contacto con nuestra oficina. Harland Dingwall de fax es [place] [phone].  Si tiene un asunto urgente cuando la clnica est cerrada y que no puede esperar hasta el siguiente da hbil, puede llamar/localizar a su doctor(a) al nmero que aparece a continuacin.   Por favor, tenga en cuenta que aunque hacemos todo lo posible para estar disponibles para asuntos urgentes fuera del horario de [place], no estamos disponibles las 24 horas del da, los 7 das de la [place].   Si tiene un problema urgente y no puede comunicarse con nosotros, puede optar por buscar atencin mdica  en el consultorio de su doctor(a), en una clnica privada, en un centro de atencin urgente o en una sala de emergencias.  Si tiene Engineering geologist, por favor llame inmediatamente al 911 o vaya a la sala de emergencias.  Nmeros de bper  - Dr. Nehemiah Massed: [phone]  - Dra. Moye: [phone]  - Dra. Nicole Kindred: [phone]  En caso de inclemencias del [place], por favor llame a Johnsie Kindred principal al [phone] para una actualizacin sobre el [place] de cualquier retraso o cierre.  Consejos para la medicacin en dermatologa: Por favor, guarde las cajas en las que vienen los medicamentos de uso tpico para ayudarle a seguir las instrucciones sobre dnde y cmo usarlos. Las farmacias generalmente imprimen las instrucciones del medicamento slo en las cajas y no directamente en los tubos del [place].   Si su medicamento es muy caro, por favor, pngase en contacto con Zigmund Daniel llamando al [phone] y presione la opcin 4 o envenos un mensaje a travs de Pharmacist, community.   No podemos decirle cul ser su copago por los medicamentos por adelantado ya que esto es diferente dependiendo de la cobertura de su seguro. Sin embargo, es posible que podamos encontrar un medicamento sustituto a Contractor un formulario para que el seguro cubra el medicamento que se considera necesario.   Si se requiere una autorizacin previa para que su compaa de seguros [place] su medicamento, por favor permtanos de 1 a 2 das hbiles para completar este proceso.  Los precios de los medicamentos varan con frecuencia dependiendo del Environmental consultant de dnde se surte la receta y alguna farmacias pueden ofrecer precios ms baratos.  El sitio web www.goodrx.com tiene cupones para medicamentos de Airline pilot. Los precios aqu no tienen en cuenta lo que podra costar con la ayuda del seguro (puede ser ms barato con su seguro), pero el sitio web puede darle el precio si no utiliz Research scientist (physical sciences).  - Puede imprimir el cupn correspondiente y llevarlo con su receta a la farmacia.  - Tambin puede pasar por nuestra oficina durante el horario de atencin regular y Charity fundraiser una tarjeta de cupones de GoodRx.  - Si necesita que su receta se enve electrnicamente a Chiropodist, informe a nuestra oficina  a travs de MyChart [place] o por telfono llamando al [phone] y presione la opcin 4.

## 2022-07-09 NOTE — Telephone Encounter (Signed)
Patients PA for Judy Huang is currently being worked on. Patient has a labeled box in fridge for her next injection.

## 2022-07-15 ENCOUNTER — Other Ambulatory Visit (HOSPITAL_COMMUNITY): Payer: Self-pay

## 2022-07-15 ENCOUNTER — Telehealth: Payer: Self-pay | Admitting: Nurse Practitioner

## 2022-07-15 NOTE — Telephone Encounter (Signed)
Noted  

## 2022-07-15 NOTE — Telephone Encounter (Signed)
Pt called wanted PCP to know that insurance has fax a pre authorization for her medication

## 2022-07-16 ENCOUNTER — Telehealth: Payer: Self-pay

## 2022-07-16 ENCOUNTER — Other Ambulatory Visit (HOSPITAL_COMMUNITY): Payer: Self-pay

## 2022-07-16 NOTE — Telephone Encounter (Signed)
Insurance is currently denying [name] prior authorization due to patient has not tried and failed a medium to high potency steroid within the last year.

## 2022-07-18 ENCOUNTER — Encounter: Payer: Self-pay | Admitting: Dermatology

## 2022-07-22 MED ORDER — CLOBETASOL PROPIONATE 0.05 % EX CREA: 1.0000 | TOPICAL_CREAM | Freq: Every day | CUTANEOUS | 0 refills | Status: DC

## 2022-07-22 NOTE — Telephone Encounter (Signed)
Patient advised of information per Dr. Nehemiah Massed and RX sent in. Patient advised to keep January follow up. aw

## 2022-07-23 ENCOUNTER — Ambulatory Visit: Payer: 59

## 2022-08-03 ENCOUNTER — Other Ambulatory Visit (INDEPENDENT_AMBULATORY_CARE_PROVIDER_SITE_OTHER): Payer: 59

## 2022-08-03 DIAGNOSIS — E7800 Pure hypercholesterolemia, unspecified: Secondary | ICD-10-CM | POA: Diagnosis not present

## 2022-08-03 LAB — HEPATIC FUNCTION PANEL
ALT: 22 U/L (ref 0–35)
AST: 18 U/L (ref 0–37)
Albumin: 4.2 g/dL (ref 3.5–5.2)
Alkaline Phosphatase: 65 U/L (ref 39–117)
Bilirubin, Direct: 0.1 mg/dL (ref 0.0–0.3)
Total Bilirubin: 0.3 mg/dL (ref 0.2–1.2)
Total Protein: 7 g/dL (ref 6.0–8.3)

## 2022-08-03 LAB — LIPID PANEL
Cholesterol: 136 mg/dL (ref 0–200)
HDL: 34.5 mg/dL — ABNORMAL LOW (ref >39.00)
LDL Cholesterol: 74 mg/dL (ref 0–99)
NonHDL: 101.75
Total CHOL/HDL Ratio: 4
Triglycerides: 141 mg/dL (ref 0.0–149.0)
VLDL: 28.2 mg/dL (ref 0.0–40.0)

## 2022-08-10 NOTE — Progress Notes (Deleted)
GYNECOLOGY  VISIT   HPI: 57 y.o.   Widowed  Caucasian  female   [phone] with No LMP recorded. Patient has had a hysterectomy.   here for   6 week recheck  GYNECOLOGIC HISTORY: No LMP recorded. Patient has had a hysterectomy. Contraception:  hysterectomy Menopausal hormone therapy:  n/a Last mammogram:  03/20/22 Breast Density Category B, BI-RADS CATEGORY 1 Neg Last pap smear:   04/06/22 neg: HR HPV neg        OB History     Gravida  5   Para  2   Term      Preterm      AB  3   Living  2      SAB  3   IAB      Ectopic      Multiple      Live Births  2              Patient Active Problem List   Diagnosis Date Noted   Family history of protein S deficiency 04/23/2022   Gastroesophageal reflux disease 04/23/2022   Upper respiratory tract infection 01/06/2022   Sinus pressure 01/06/2022   Pneumonia of both lower lobes due to infectious organism 01/06/2022   Acute cough 10/06/2021   Acute non-recurrent pansinusitis 09/29/2021   Preventative health care 08/19/2021   Eustachian tube dysfunction, bilateral 05/13/2021   PND (post-nasal drip) 05/13/2021   Productive cough 04/28/2021   Acute non-recurrent ethmoidal sinusitis 04/28/2021   Sensation of fullness in both ears 04/28/2021   Acute appendicitis with localized peritonitis 02/21/2021   Chest pressure 01/22/2021   Breast asymmetry 01/22/2021   Breast pain 01/14/2021   Encounter for screening mammogram for breast cancer 12/31/2020   Prediabetes 12/30/2020   Elevated TSH 12/30/2020   Allergic rhinitis 12/30/2020   Overweight (BMI 25.0-29.9) 12/30/2020   Chronic cough 10/02/2020   Vitamin D deficiency 05/31/2020   Palpitations 09/06/2018   Essential hypertension 09/10/2017   HLD (hyperlipidemia) 09/06/2017   Polyp of sigmoid colon    Vasomotor symptoms due to menopause 09/10/2016   History of nonmelanoma skin cancer 10/16/2015   Anxiety and depression 05/06/2015    Past Medical History:  Diagnosis  Date   Arthritis    Basal cell carcinoma 06/26/2021   left shoulder, top of deltoid - [facility] 09/15/2021   Basal cell carcinoma 08/25/2021   Left distal ant thigh - [facility]   Basal cell carcinoma 08/25/2021   Left upper back paraspinal - [facility]   Basal cell carcinoma 08/25/2021   Right infrascapular - [facility]   Basal cell carcinoma 08/25/2021   Right mid back braline -[facility]   Chicken pox    Depression    Dysplastic nevus 05/05/2022   LLQA, severe atypia, margins free but close to margin, recheck in 3 months   GERD (gastroesophageal reflux disease)    High blood pressure    High cholesterol    History of gastroesophageal reflux (GERD)    History of headache    History of migraine    History of UTI    Melanoma in situ ([place]) 11/24/2021   Left post shoulder - Excised 02/10/2022   Polyp of colon    PONV (postoperative nausea and vomiting)     Past Surgical History:  Procedure Laterality Date   BREAST REDUCTION SURGERY  2000   CESAREAN SECTION  1990   COLONOSCOPY WITH PROPOFOL N/A 01/22/2017   Procedure: COLONOSCOPY WITH PROPOFOL;  Surgeon: [name], MD;  Location: [facility];  Service: Endoscopy;  Laterality: N/A;   PARTIAL HYSTERECTOMY  1997   POLYPECTOMY  01/22/2017   Procedure: POLYPECTOMY;  Surgeon: [name], MD;  Location: [place];  Service: Endoscopy;;   REDUCTION MAMMAPLASTY Bilateral 2000   BILAT   XI ROBOTIC LAPAROSCOPIC ASSISTED APPENDECTOMY N/A 02/21/2021   Procedure: XI ROBOTIC LAPAROSCOPIC ASSISTED APPENDECTOMY;  Surgeon: Herbert Pun, MD;  Location: [facility];  Service: General;  Laterality: N/A;    Current Outpatient Medications  Medication Sig Dispense Refill   clobetasol cream (TEMOVATE) [id number] % Apply 1 Application topically daily. Of affected areas PRN. Avoid face, groin and underarms. 30 g 0   albuterol (VENTOLIN HFA) 108 (90 Base) MCG/ACT inhaler Inhale 2 puffs into the lungs every 4 (four) hours as needed for wheezing or shortness of breath  (cough, shortness of breath or wheezing.). 6.7 g 3   ascorbic acid (VITAMIN C) 500 MG tablet Take 500 mg by mouth daily.     aspirin EC 81 MG tablet Take 81 mg by mouth daily.     fluticasone (FLONASE) 50 MCG/ACT nasal spray Place 2 sprays into both nostrils daily. 16 g 6   gabapentin (NEURONTIN) 100 MG capsule Take one capsule (100 mg) at night for 3 nights.  Then increase to two capsules (200 mg) at night for 3 nights.  Then increase to to three capsules (300 mg) at night. 90 capsule 1   hydrochlorothiazide (MICROZIDE) 12.5 MG capsule Take 1 capsule (12.5 mg total) by mouth daily. 90 capsule 1   MAGNESIUM PO Take by mouth.     Multiple Vitamin (MULTIVITAMIN WITH MINERALS) TABS tablet Take 1 tablet by mouth daily.     omeprazole (PRILOSEC) 20 MG capsule Take 1 capsule (20 mg total) by mouth daily. 90 capsule 1   OVER THE COUNTER MEDICATION Muscle cramp formula , juice plus Omega blend.     PARoxetine (PAXIL) 10 MG tablet Take 1 tablet (10 mg total) by mouth daily. 30 tablet 2   rosuvastatin (CRESTOR) 5 MG tablet Take 1 tablet (5 mg total) by mouth daily. 90 tablet 1   Tralokinumab-ldrm ([name]) 150 MG/ML SOSY Inject 2 mLs (300 mg total) into the skin every 14 (fourteen) days. Starting on day 15 for maintenance. 4 mL 0   vitamin B-12 (CYANOCOBALAMIN) 1000 MCG tablet Take 1,000 mcg by mouth daily.     No current facility-administered medications for this visit.     ALLERGIES: Other and Percocet [oxycodone-acetaminophen]  Family History  Problem Relation Age of Onset   Arthritis Mother    Peripheral Artery Disease Mother    Arthritis Father    Hyperlipidemia Father    Stroke Father    High blood pressure Father    Diabetes Father    Protein S deficiency Father    Protein S deficiency Sister    Hodgkin's lymphoma Maternal Uncle    Early death Maternal Uncle 67   Cancer Maternal Grandmother        intestinal   Arthritis Maternal Grandmother    Stroke Maternal Grandmother    Birth  defects Maternal Grandmother    High Cholesterol Maternal Grandmother    Arthritis Maternal Grandfather    Cancer Maternal Grandfather    Early death Maternal Grandfather    Emphysema Maternal Grandfather    Cancer Paternal Grandmother    Alcohol abuse Paternal Grandmother    Heart attack Paternal Grandmother    Breast cancer Neg Hx     Social History  Socioeconomic History   Marital status: Widowed    Spouse name: Not on file   Number of children: Not on file   Years of education: Not on file   Highest education level: Not on file  Occupational History   Not on file  Tobacco Use   Smoking status: Former    Packs/day: 0.50    Years: 20.00    Total pack years: 10.00    Types: Cigarettes    Start date: 07/26/2013    Quit date: 2016    Years since quitting: 8.0   Smokeless tobacco: Never  Vaping Use   Vaping Use: Never used  Substance and Sexual Activity   Alcohol use: No    Comment: rare   Drug use: No   Sexual activity: Not Currently    Birth control/protection: Surgical    Comment: Hyst  Other Topics Concern   Not on file  Social History Narrative   Not on file   Social Determinants of Health   Financial Resource Strain: Not on file  Food Insecurity: Not on file  Transportation Needs: Not on file  Physical Activity: Not on file  Stress: Not on file  Social Connections: Not on file  Intimate Partner Violence: Not on file    Review of Systems  PHYSICAL EXAMINATION:    There were no vitals taken for this visit.    General appearance: alert, cooperative and appears stated age Head: Normocephalic, without obvious abnormality, atraumatic Neck: no adenopathy, supple, symmetrical, trachea midline and thyroid normal to inspection and palpation Lungs: clear to auscultation bilaterally Breasts: normal appearance, no masses or tenderness, No nipple retraction or dimpling, No nipple discharge or bleeding, No axillary or supraclavicular adenopathy Heart: regular  rate and rhythm Abdomen: soft, non-tender, no masses,  no organomegaly Extremities: extremities normal, atraumatic, no cyanosis or edema Skin: Skin color, texture, turgor normal. No rashes or lesions Lymph nodes: Cervical, supraclavicular, and axillary nodes normal. No abnormal inguinal nodes palpated Neurologic: Grossly normal  Pelvic: External genitalia:  no lesions              Urethra:  normal appearing urethra with no masses, tenderness or lesions              Bartholins and Skenes: normal                 Vagina: normal appearing vagina with normal color and discharge, no lesions              Cervix: no lesions                Bimanual Exam:  Uterus:  normal size, contour, position, consistency, mobility, non-tender              Adnexa: no mass, fullness, tenderness              Rectal exam: {yes no:314532}.  Confirms.              Anus:  normal sphincter tone, no lesions  Chaperone was present for exam:  ***  ASSESSMENT     PLAN     An After Visit Summary was printed and given to the patient.  ______ minutes face to face time of which over 50% was spent in counseling.

## 2022-08-12 ENCOUNTER — Ambulatory Visit: Payer: 59 | Admitting: Dermatology

## 2022-08-12 ENCOUNTER — Encounter: Payer: 59 | Admitting: Dermatology

## 2022-08-12 VITALS — BP 122/76 | HR 73

## 2022-08-12 DIAGNOSIS — Z86018 Personal history of other benign neoplasm: Secondary | ICD-10-CM

## 2022-08-12 DIAGNOSIS — Z85820 Personal history of malignant melanoma of skin: Secondary | ICD-10-CM

## 2022-08-12 DIAGNOSIS — D229 Melanocytic nevi, unspecified: Secondary | ICD-10-CM | POA: Diagnosis not present

## 2022-08-12 DIAGNOSIS — L578 Other skin changes due to chronic exposure to nonionizing radiation: Secondary | ICD-10-CM

## 2022-08-12 DIAGNOSIS — Z1283 Encounter for screening for malignant neoplasm of skin: Secondary | ICD-10-CM

## 2022-08-12 DIAGNOSIS — Z79899 Other long term (current) drug therapy: Secondary | ICD-10-CM | POA: Diagnosis not present

## 2022-08-12 DIAGNOSIS — L2089 Other atopic dermatitis: Secondary | ICD-10-CM

## 2022-08-12 DIAGNOSIS — L820 Inflamed seborrheic keratosis: Secondary | ICD-10-CM | POA: Diagnosis not present

## 2022-08-12 DIAGNOSIS — L814 Other melanin hyperpigmentation: Secondary | ICD-10-CM | POA: Diagnosis not present

## 2022-08-12 DIAGNOSIS — L821 Other seborrheic keratosis: Secondary | ICD-10-CM

## 2022-08-12 NOTE — Patient Instructions (Addendum)
Cryotherapy Aftercare  Wash gently with soap and water everyday.   Apply Vaseline and Band-Aid daily until healed.     Due to recent changes in healthcare laws, you may see results of your pathology and/or laboratory studies on MyChart before the doctors have had a chance to review them. We understand that in some cases there may be results that are confusing or concerning to you. Please understand that not all results are received at the same time and often the doctors may need to interpret multiple results in order to provide you with the best plan of care or course of treatment. Therefore, we ask that you please give us 2 business days to thoroughly review all your results before contacting the office for clarification. Should we see a critical lab result, you will be contacted sooner.   If You Need Anything After Your Visit  If you have any questions or concerns for your doctor, please call our main line at [phone] and press option 4 to reach your doctor's medical assistant. If no one answers, please leave a voicemail as directed and we will return your call as soon as possible. Messages left after 4 pm will be answered the following business day.   You may also send us a message via MyChart. We typically respond to MyChart messages within 1-2 business days.  For prescription refills, please ask your pharmacy to contact our office. Our fax number is [phone].  If you have an urgent issue when the clinic is closed that cannot wait until the next business day, you can page your doctor at the number below.    Please note that while we do our best to be available for urgent issues outside of office hours, we are not available 24/7.   If you have an urgent issue and are unable to reach us, you may choose to seek medical care at your doctor's office, retail clinic, urgent care center, or emergency room.  If you have a medical emergency, please immediately call 911 or go to the  emergency department.  Pager Numbers  - Dr. Kowalski: [phone]  - Dr. Moye: [phone]  - Dr. Stewart: [phone]  In the event of inclement weather, please call our main line at [phone] for an update on the status of any delays or closures.  Dermatology Medication Tips: Please keep the boxes that topical medications come in in order to help keep track of the instructions about where and how to use these. Pharmacies typically print the medication instructions only on the boxes and not directly on the medication tubes.   If your medication is too expensive, please contact our office at [phone] option 4 or send us a message through MyChart.   We are unable to tell what your co-pay for medications will be in advance as this is different depending on your insurance coverage. However, we may be able to find a substitute medication at lower cost or fill out paperwork to get insurance to cover a needed medication.   If a prior authorization is required to get your medication covered by your insurance company, please allow us 1-2 business days to complete this process.  Drug prices often vary depending on where the prescription is filled and some pharmacies may offer cheaper prices.  The website www.goodrx.com contains coupons for medications through different pharmacies. The prices here do not account for what the cost may be with help from insurance (it may be cheaper with your insurance), but the website can   give you the price if you did not use any insurance.  - You can print the associated coupon and take it with your prescription to the pharmacy.  - You may also stop by our office during regular business hours and pick up a GoodRx coupon card.  - If you need your prescription sent electronically to a different pharmacy, notify our office through [facility] MyChart or by phone at [phone] option 4.     Si Usted Necesita Algo Despus de Su Visita  Tambin puede  enviarnos un mensaje a travs de MyChart. Por lo general respondemos a los mensajes de MyChart en el transcurso de 1 a 2 das hbiles.  Para renovar recetas, por favor pida a su farmacia que se ponga en contacto con nuestra oficina. Nuestro nmero de fax es el [phone].  Si tiene un asunto urgente cuando la clnica est cerrada y que no puede esperar hasta el siguiente da hbil, puede llamar/localizar a su doctor(a) al nmero que aparece a continuacin.   Por favor, tenga en cuenta que aunque hacemos todo lo posible para estar disponibles para asuntos urgentes fuera del horario de oficina, no estamos disponibles las 24 horas del da, los 7 das de la semana.   Si tiene un problema urgente y no puede comunicarse con nosotros, puede optar por buscar atencin mdica  en el consultorio de su doctor(a), en una clnica privada, en un centro de atencin urgente o en una sala de emergencias.  Si tiene una emergencia mdica, por favor llame inmediatamente al 911 o vaya a la sala de emergencias.  Nmeros de bper  - Dr. Kowalski: [phone]  - Dra. Moye: [phone]  - Dra. Stewart: [phone]  En caso de inclemencias del tiempo, por favor llame a nuestra lnea principal al [phone] para una actualizacin sobre el estado de cualquier retraso o cierre.  Consejos para la medicacin en dermatologa: Por favor, guarde las cajas en las que vienen los medicamentos de uso tpico para ayudarle a seguir las instrucciones sobre dnde y cmo usarlos. Las farmacias generalmente imprimen las instrucciones del medicamento slo en las cajas y no directamente en los tubos del medicamento.   Si su medicamento es muy caro, por favor, pngase en contacto con nuestra oficina llamando al [phone] y presione la opcin 4 o envenos un mensaje a travs de MyChart.   No podemos decirle cul ser su copago por los medicamentos por adelantado ya que esto es diferente dependiendo de la cobertura de su seguro.  Sin embargo, es posible que podamos encontrar un medicamento sustituto a menor costo o llenar un formulario para que el seguro cubra el medicamento que se considera necesario.   Si se requiere una autorizacin previa para que su compaa de seguros cubra su medicamento, por favor permtanos de 1 a 2 das hbiles para completar este proceso.  Los precios de los medicamentos varan con frecuencia dependiendo del lugar de dnde se surte la receta y alguna farmacias pueden ofrecer precios ms baratos.  El sitio web www.goodrx.com tiene cupones para medicamentos de diferentes farmacias. Los precios aqu no tienen en cuenta lo que podra costar con la ayuda del seguro (puede ser ms barato con su seguro), pero el sitio web puede darle el precio si no utiliz ningn seguro.  - Puede imprimir el cupn correspondiente y llevarlo con su receta a la farmacia.  - Tambin puede pasar por nuestra oficina durante el horario de atencin regular y recoger una tarjeta de cupones de GoodRx.  -   Si necesita que su receta se enve electrnicamente a una farmacia diferente, informe a nuestra oficina a travs de MyChart de [place] o por telfono llamando al [phone] y presione la opcin 4.  

## 2022-08-12 NOTE — Progress Notes (Unsigned)
Follow-Up Visit   Subjective  Judy Huang is a 57 y.o. female who presents for the following: Annual Exam. Hx of Melanoma on the left post shoulder- removed 11/24/2021, hx of BCC, hx of Dysplastic nevus The patient presents for Total-Body Skin Exam (TBSE) for skin cancer screening and mole check.  The patient has spots, moles and lesions to be evaluated, some may be new or changing and the patient has concerns that these could be cancer.   The following portions of the chart were reviewed this encounter and updated as appropriate:   Tobacco  Allergies  Meds  Problems  Med Hx  Surg Hx  Fam Hx     Review of Systems:  No other skin or systemic complaints except as noted in HPI or Assessment and Plan.  Objective  Well appearing patient in no apparent distress; mood and affect are within normal limits.  A full examination was performed including scalp, head, eyes, ears, nose, lips, neck, chest, axillae, abdomen, back, buttocks, bilateral upper extremities, bilateral lower extremities, hands, feet, fingers, toes, fingernails, and toenails. All findings within normal limits unless otherwise noted below.  inframammary x 25 (25) Stuck-on, waxy, tan-brown irritated papules-- Discussed benign etiology and prognosis.   hands, inframammary Pink dry patches    Assessment & Plan  Inflamed seborrheic keratosis (25) inframammary x 25 Symptomatic, irritating, patient would like treated.  Destruction of lesion - inframammary x 25 Complexity: simple   Destruction method: cryotherapy   Informed consent: discussed and consent obtained   Timeout:  patient name, date of birth, surgical site, and procedure verified Lesion destroyed using liquid nitrogen: Yes   Region frozen until ice ball extended beyond lesion: Yes   Outcome: patient tolerated procedure well with no complications   Post-procedure details: wound care instructions given    Other atopic dermatitis hands, inframammary Atopic  dermatitis with overlap of Psoriasis  Chronic and persistent condition with duration or expected duration over one year. Condition is bothersome/symptomatic for patient.  Improved. but not at goal.    Atopic dermatitis (eczema) is a chronic, relapsing, pruritic condition that can significantly affect quality of life. It is often associated with allergic rhinitis and/or asthma and can require treatment with topical medications, phototherapy, or in severe cases biologic injectable medication ([name]; [name]) or Oral JAK inhibitors.   Sotyku, Otzela and [name] didn't help, [name] and [name] not covered by [employer]. [name] helped clear her hands.   She had experienced some joint pain that may have been associated with [name] use. She has discontinued [name] and only using topicals.  Continue Clobetasol cream apply to affected skin 5 days a week PRN only Topical steroids (such as triamcinolone, fluocinolone, fluocinonide, mometasone, clobetasol, halobetasol, betamethasone, hydrocortisone) can cause thinning and lightening of the skin if they are used for too long in the same area. Your physician has selected the right strength medicine for your problem and area affected on the body. Please use your medication only as directed by your physician to prevent side effects.    Lentigines - Scattered tan macules - Due to sun exposure - Benign-appearing, observe - Recommend daily broad spectrum sunscreen SPF 30+ to sun-exposed areas, reapply every 2 hours as needed. - Call for any changes  Seborrheic Keratoses - Stuck-on, waxy, tan-brown papules and/or plaques  - Benign-appearing - Discussed benign etiology and prognosis. - Observe - Call for any changes  Melanocytic Nevi - Tan-brown and/or pink-flesh-colored symmetric macules and papules - Benign appearing on exam today -  Observation - Call clinic for new or changing moles - Recommend daily use of broad spectrum spf 30+ sunscreen to  sun-exposed areas.   Hemangiomas - Red papules - Discussed benign nature - Observe - Call for any changes  Actinic Damage - Chronic condition, secondary to cumulative UV/sun exposure - diffuse scaly erythematous macules with underlying dyspigmentation - Recommend daily broad spectrum sunscreen SPF 30+ to sun-exposed areas, reapply every 2 hours as needed.  - Staying in the shade or wearing long sleeves, sun glasses (UVA+UVB protection) and wide brim hats (4-inch brim around the entire circumference of the hat) are also recommended for sun protection.  - Call for new or changing lesions.  History of Dysplastic Nevi LLQA  - No evidence of recurrence today - Recommend regular full body skin exams - Recommend daily broad spectrum sunscreen SPF 30+ to sun-exposed areas, reapply every 2 hours as needed.  - Call if any new or changing lesions are noted between office visits   History of Basal Cell Carcinoma of the Skin Multiple see history  - No evidence of recurrence today - Recommend regular full body skin exams - Recommend daily broad spectrum sunscreen SPF 30+ to sun-exposed areas, reapply every 2 hours as needed.  - Call if any new or changing lesions are noted between office visits   History of Melanoma in Situ Left post shoulder 11/24/2021 - No evidence of recurrence today - Recommend regular full body skin exams - Recommend daily broad spectrum sunscreen SPF 30+ to sun-exposed areas, reapply every 2 hours as needed.  - Call if any new or changing lesions are noted between office visits  Skin cancer screening performed today.   Return in about 3 months (around 11/11/2022) for TBSE, hx of Melanoma, hx of BCC, Hx of Dysplastic nevus .  IMarye Round, CMA, am acting as scribe for Sarina Ser, MD .  Documentation: I have reviewed the above documentation for accuracy and completeness, and I agree with the above.  Sarina Ser, MD

## 2022-08-14 ENCOUNTER — Other Ambulatory Visit: Payer: Self-pay | Admitting: Nurse Practitioner

## 2022-08-14 DIAGNOSIS — I10 Essential (primary) hypertension: Secondary | ICD-10-CM

## 2022-08-17 ENCOUNTER — Ambulatory Visit: Payer: 59 | Admitting: Obstetrics and Gynecology

## 2022-08-19 ENCOUNTER — Encounter: Payer: Self-pay | Admitting: Dermatology

## 2022-08-20 ENCOUNTER — Ambulatory Visit: Payer: 59 | Admitting: Obstetrics and Gynecology

## 2022-08-24 ENCOUNTER — Ambulatory Visit: Payer: 59 | Admitting: Obstetrics and Gynecology

## 2022-09-08 NOTE — Progress Notes (Signed)
GYNECOLOGY  VISIT   HPI: 57 y.o.   Widowed  Caucasian  female   [phone] with No LMP recorded. Patient has had a hysterectomy.   here for  6 week recheck.  She wants to try Paxil again.   Effexor caused anxiety.   Paxil caused her to feel sad but she had no hot flashes.   She started on Gabapentin after her visit 07/06/22.  Hot flashes are somewhat improved on Gabapentin 200 mg.  300 mg dosage did not improve hot flashes further, so she went back down to 200 mg.  Feels anxiety and stress are improved on the Gabapentin.   GYNECOLOGIC HISTORY: No LMP recorded. Patient has had a hysterectomy. Contraception:  hysterectomy Menopausal hormone therapy:  n/a Last mammogram:  03/20/22 Breast Density Category B BI-RADS CATEGORY 1 Neg Last pap smear:   04/06/22 neg: HR HPV neg        OB History     Gravida  5   Para  2   Term      Preterm      AB  3   Living  2      SAB  3   IAB      Ectopic      Multiple      Live Births  2              Patient Active Problem List   Diagnosis Date Noted   Family history of protein S deficiency 04/23/2022   Gastroesophageal reflux disease 04/23/2022   Upper respiratory tract infection 01/06/2022   Sinus pressure 01/06/2022   Pneumonia of both lower lobes due to infectious organism 01/06/2022   Acute cough 10/06/2021   Acute non-recurrent pansinusitis 09/29/2021   Preventative health care 08/19/2021   Eustachian tube dysfunction, bilateral 05/13/2021   PND (post-nasal drip) 05/13/2021   Productive cough 04/28/2021   Acute non-recurrent ethmoidal sinusitis 04/28/2021   Sensation of fullness in both ears 04/28/2021   Acute appendicitis with localized peritonitis 02/21/2021   Chest pressure 01/22/2021   Breast asymmetry 01/22/2021   Breast pain 01/14/2021   Encounter for screening mammogram for breast cancer 12/31/2020   Prediabetes 12/30/2020   Elevated TSH 12/30/2020   Allergic rhinitis 12/30/2020   Overweight (BMI  25.0-29.9) 12/30/2020   Chronic cough 10/02/2020   Vitamin D deficiency 05/31/2020   Palpitations 09/06/2018   Essential hypertension 09/10/2017   HLD (hyperlipidemia) 09/06/2017   Polyp of sigmoid colon    Vasomotor symptoms due to menopause 09/10/2016   History of nonmelanoma skin cancer 10/16/2015   Anxiety and depression 05/06/2015    Past Medical History:  Diagnosis Date   Arthritis    Basal cell carcinoma 06/26/2021   left shoulder, top of deltoid - [facility] 09/15/2021   Basal cell carcinoma 08/25/2021   Left distal ant thigh - [facility]   Basal cell carcinoma 08/25/2021   Left upper back paraspinal - [facility]   Basal cell carcinoma 08/25/2021   Right infrascapular - [facility]   Basal cell carcinoma 08/25/2021   Right mid back braline -[facility]   Chicken pox    Depression    Dysplastic nevus 05/05/2022   LLQA, severe atypia, margins free but close to margin, recheck in 3 months   GERD (gastroesophageal reflux disease)    High blood pressure    High cholesterol    History of gastroesophageal reflux (GERD)    History of headache    History of migraine    History of UTI  Melanoma in situ ([place]) 11/24/2021   Left post shoulder - Excised 02/10/2022   Polyp of colon    PONV (postoperative nausea and vomiting)     Past Surgical History:  Procedure Laterality Date   BREAST REDUCTION SURGERY  2000   CESAREAN SECTION  1990   COLONOSCOPY WITH PROPOFOL N/A 01/22/2017   Procedure: COLONOSCOPY WITH PROPOFOL;  Surgeon: [name], MD;  Location: [place];  Service: Endoscopy;  Laterality: N/A;   PARTIAL HYSTERECTOMY  1997   POLYPECTOMY  01/22/2017   Procedure: POLYPECTOMY;  Surgeon: [name], MD;  Location: [place];  Service: Endoscopy;;   REDUCTION MAMMAPLASTY Bilateral 2000   BILAT   XI ROBOTIC LAPAROSCOPIC ASSISTED APPENDECTOMY N/A 02/21/2021   Procedure: XI ROBOTIC LAPAROSCOPIC ASSISTED APPENDECTOMY;  Surgeon: Herbert Pun, MD;  Location: [facility];  Service:  General;  Laterality: N/A;    Current Outpatient Medications  Medication Sig Dispense Refill   albuterol (VENTOLIN HFA) 108 (90 Base) MCG/ACT inhaler Inhale 2 puffs into the lungs every 4 (four) hours as needed for wheezing or shortness of breath (cough, shortness of breath or wheezing.). 6.7 g 3   ascorbic acid (VITAMIN C) 500 MG tablet Take 500 mg by mouth daily.     aspirin EC 81 MG tablet Take 81 mg by mouth daily.     clobetasol cream (TEMOVATE) [id number] % Apply 1 Application topically daily. Of affected areas PRN. Avoid face, groin and underarms. 30 g 0   fluticasone (FLONASE) 50 MCG/ACT nasal spray Place 2 sprays into both nostrils daily. 16 g 6   gabapentin (NEURONTIN) 100 MG capsule Take one capsule (100 mg) at night for 3 nights.  Then increase to two capsules (200 mg) at night for 3 nights.  Then increase to to three capsules (300 mg) at night. 90 capsule 1   hydrochlorothiazide (MICROZIDE) 12.5 MG capsule TAKE 1 CAPSULE BY MOUTH EVERY DAY 90 capsule 2   MAGNESIUM PO Take by mouth.     Multiple Vitamin (MULTIVITAMIN WITH MINERALS) TABS tablet Take 1 tablet by mouth daily.     omeprazole (PRILOSEC) 20 MG capsule Take 1 capsule (20 mg total) by mouth daily. 90 capsule 1   OVER THE COUNTER MEDICATION Muscle cramp formula , juice plus Omega blend.     rosuvastatin (CRESTOR) 5 MG tablet Take 1 tablet (5 mg total) by mouth daily. 90 tablet 1   Tralokinumab-ldrm ([name]) 150 MG/ML SOSY Inject 2 mLs (300 mg total) into the skin every 14 (fourteen) days. Starting on day 15 for maintenance. 4 mL 0   vitamin B-12 (CYANOCOBALAMIN) 1000 MCG tablet Take 1,000 mcg by mouth daily.     No current facility-administered medications for this visit.     ALLERGIES: Other and Percocet [oxycodone-acetaminophen]  Family History  Problem Relation Age of Onset   Arthritis Mother    Peripheral Artery Disease Mother    Arthritis Father    Hyperlipidemia Father    Stroke Father    High blood pressure Father     Diabetes Father    Protein S deficiency Father    Protein S deficiency Sister    Hodgkin's lymphoma Maternal Uncle    Early death Maternal Uncle 35   Cancer Maternal Grandmother        intestinal   Arthritis Maternal Grandmother    Stroke Maternal Grandmother    Birth defects Maternal Grandmother    High Cholesterol Maternal Grandmother    Arthritis Maternal Grandfather    Cancer  Maternal Grandfather    Early death Maternal Grandfather    Emphysema Maternal Grandfather    Cancer Paternal Grandmother    Alcohol abuse Paternal Grandmother    Heart attack Paternal Grandmother    Breast cancer Neg Hx     Social History   Socioeconomic History   Marital status: Widowed    Spouse name: Not on file   Number of children: Not on file   Years of education: Not on file   Highest education level: Not on file  Occupational History   Not on file  Tobacco Use   Smoking status: Former    Packs/day: 0.50    Years: 20.00    Total pack years: 10.00    Types: Cigarettes    Start date: 07/26/2013    Quit date: 2016    Years since quitting: 8.1   Smokeless tobacco: Never  Vaping Use   Vaping Use: Never used  Substance and Sexual Activity   Alcohol use: No    Comment: rare   Drug use: No   Sexual activity: Not Currently    Birth control/protection: Surgical    Comment: Hyst  Other Topics Concern   Not on file  Social History Narrative   Not on file   Social Determinants of Health   Financial Resource Strain: Not on file  Food Insecurity: Not on file  Transportation Needs: Not on file  Physical Activity: Not on file  Stress: Not on file  Social Connections: Not on file  Intimate Partner Violence: Not on file    Review of Systems  All other systems reviewed and are negative.   PHYSICAL EXAMINATION:    BP 124/84 (BP Location: Left Arm, Patient Position: Sitting, Cuff Size: Normal)   Pulse 78   Ht '5\' 4"'$  (1.626 m)   Wt 170 lb (77.1 kg)   SpO2 97%   BMI 29.18 kg/m      General appearance: alert, cooperative and appears stated age  ASSESSMENT  Menopausal symptoms.   Encounter for medication monitoring.   PLAN  Continue Gabapentin 200 mg q hs.  #180, RF one. Rx for Paxil 10 mg, take 1/2 tablet daily.  #45, RF one.  She will watch for signs of depression and appetite stimulation.  Fu for annual exam Sept 2024 and prn.    An After Visit Summary was printed and given to the patient.  26 min  total time was spent for this patient encounter, including preparation, face-to-face counseling with the patient, coordination of care, and documentation of the encounter.

## 2022-09-22 ENCOUNTER — Encounter: Payer: Self-pay | Admitting: Obstetrics and Gynecology

## 2022-09-22 ENCOUNTER — Ambulatory Visit (INDEPENDENT_AMBULATORY_CARE_PROVIDER_SITE_OTHER): Payer: 59 | Admitting: Obstetrics and Gynecology

## 2022-09-22 VITALS — BP 124/84 | HR 78 | Ht 64.0 in | Wt 170.0 lb

## 2022-09-22 DIAGNOSIS — N951 Menopausal and female climacteric states: Secondary | ICD-10-CM

## 2022-09-22 DIAGNOSIS — Z5181 Encounter for therapeutic drug level monitoring: Secondary | ICD-10-CM

## 2022-09-22 MED ORDER — GABAPENTIN 100 MG PO CAPS: ORAL_CAPSULE | ORAL | 1 refills | Status: DC

## 2022-09-22 MED ORDER — PAROXETINE HCL 10 MG PO TABS: ORAL_TABLET | ORAL | 1 refills | Status: DC

## 2022-09-25 ENCOUNTER — Other Ambulatory Visit: Payer: Self-pay | Admitting: Nurse Practitioner

## 2022-09-25 DIAGNOSIS — E7800 Pure hypercholesterolemia, unspecified: Secondary | ICD-10-CM

## 2022-10-08 ENCOUNTER — Other Ambulatory Visit: Payer: Self-pay

## 2022-10-08 ENCOUNTER — Encounter: Payer: Self-pay | Admitting: Obstetrics and Gynecology

## 2022-10-08 ENCOUNTER — Other Ambulatory Visit: Payer: Self-pay | Admitting: Obstetrics and Gynecology

## 2022-10-08 DIAGNOSIS — R062 Wheezing: Secondary | ICD-10-CM

## 2022-10-08 DIAGNOSIS — R059 Cough, unspecified: Secondary | ICD-10-CM

## 2022-10-08 NOTE — Telephone Encounter (Signed)
Albuterol (VENTOLIN HFA) 108 (90 Base) MCG/ACT inhaler  Last visit 04/23/2022 Next visit Per Matt come back in 1 year for CPE

## 2022-10-08 NOTE — Progress Notes (Signed)
Patient has discontinued Paxil.

## 2022-10-09 MED ORDER — ALBUTEROL SULFATE HFA 108 (90 BASE) MCG/ACT IN AERS: 2.0000 | INHALATION_SPRAY | RESPIRATORY_TRACT | 1 refills | Status: DC | PRN

## 2022-10-16 ENCOUNTER — Other Ambulatory Visit: Payer: Self-pay | Admitting: Nurse Practitioner

## 2022-10-19 NOTE — Telephone Encounter (Signed)
Patient called in to follow up on this refill request 

## 2022-10-29 ENCOUNTER — Encounter: Payer: Self-pay | Admitting: Nurse Practitioner

## 2022-10-29 ENCOUNTER — Ambulatory Visit (INDEPENDENT_AMBULATORY_CARE_PROVIDER_SITE_OTHER): Payer: 59 | Admitting: Nurse Practitioner

## 2022-10-29 VITALS — BP 130/70 | HR 75 | Temp 98.5°F | Resp 16 | Ht 64.0 in | Wt 167.1 lb

## 2022-10-29 DIAGNOSIS — J0110 Acute frontal sinusitis, unspecified: Secondary | ICD-10-CM | POA: Diagnosis not present

## 2022-10-29 DIAGNOSIS — E663 Overweight: Secondary | ICD-10-CM

## 2022-10-29 DIAGNOSIS — K2900 Acute gastritis without bleeding: Secondary | ICD-10-CM

## 2022-10-29 MED ORDER — AMOXICILLIN-POT CLAVULANATE 875-125 MG PO TABS: 1.0000 | ORAL_TABLET | Freq: Two times a day (BID) | ORAL | 0 refills | Status: AC

## 2022-10-29 MED ORDER — OMEPRAZOLE 20 MG PO CPDR: 20.0000 mg | DELAYED_RELEASE_CAPSULE | Freq: Two times a day (BID) | ORAL | 1 refills | Status: DC

## 2022-10-29 MED ORDER — PHENTERMINE HCL 15 MG PO CAPS: 15.0000 mg | ORAL_CAPSULE | ORAL | 0 refills | Status: DC

## 2022-10-29 NOTE — Assessment & Plan Note (Signed)
History of the same.  Given length of illness 2 weeks will treat with Augmentin 875-125 mg twice daily.

## 2022-10-29 NOTE — Assessment & Plan Note (Signed)
Patient working lifestyle modifications.  Does have controlled hypertension and hyperlipidemia.  Will try patient on phentermine 15 mg every morning.  She will follow-up in 1 month she can be on medication 3 months no more

## 2022-10-29 NOTE — Assessment & Plan Note (Signed)
Patient currently on omeprazole 20 mg.  Does have some epigastric tenderness may have some gastritis on top of exacerbating GERD will change omeprazole 20 mg daily to omeprazole 20 mg twice daily.  This is not beneficial consider doing Carafate at that juncture.  Patient has medication at home that she will use versus any other new prescription

## 2022-10-29 NOTE — Progress Notes (Signed)
Acute Office Visit  Subjective:     Patient ID: Judy Huang, female    DOB: 26-Aug-1965, 57 y.o.   MRN: [id number]  Chief Complaint  Patient presents with   Sinus Problem    When she is using Flonase it is burning the inside of the nose X 2 weeks   Headache    Over the right eye     Patient is in today for sick symptoms with a history of HTN, sinusitis, GERD, prediabetes, hld  Symptoms started approx 2 weeks ago States that it stared with sinuses and pain in there right sinuses. States that she felt a a crawling sensation  Covid vaccine: no up to date Flu vaccine: not up to date Sick contacts Treatments: Flonase and inhaler. States that the Flonase is burring the sinus     Stomach issue: states that she will eat and feels poor after she eats and some stomach discomfort. States that if she stays with salads it is not as bad. States that she has a burning in the upper stomach. This has been over the past 6 month   Overweight: Patient states she has been working on her eating habits going to the gym and working on the [employer] style but has yet to lose any weight.  States that examiner had some phentermine 37.5 mg that she used briefly for approximately 2 weeks tolerated well and did lose approximately 6 to 7 pounds and has maintained that for the last month or so without medication use. Review of Systems  Constitutional:  Positive for malaise/fatigue. Negative for chills and fever.       Appetite is decreased  Fluid intake is good   HENT:  Positive for ear pain (right) and sinus pain.        Rhinitis   Respiratory:  Positive for cough and sputum production. Negative for shortness of breath.   Gastrointestinal:  Positive for abdominal pain and nausea. Negative for constipation, diarrhea and vomiting.  Musculoskeletal:  Negative for joint pain and myalgias.  Neurological:  Positive for headaches.        Objective:    BP 130/70   Pulse 75   Temp 98.5 F  (36.9 C)   Resp 16   Ht 5\' 4"  (1.626 m)   Wt 167 lb 2 oz (75.8 kg)   SpO2 95%   BMI 28.69 kg/m  BP Readings from Last 3 Encounters:  10/29/22 130/70  09/22/22 124/84  08/12/22 122/76   Wt Readings from Last 3 Encounters:  10/29/22 167 lb 2 oz (75.8 kg)  09/22/22 170 lb (77.1 kg)  07/06/22 173 lb (78.5 kg)      Physical Exam Vitals and nursing note reviewed.  Constitutional:      Appearance: Normal appearance.  HENT:     Right Ear: Ear canal and external ear normal.     Left Ear: Ear canal and external ear normal.     Nose:     Right Sinus: Frontal sinus tenderness present. No maxillary sinus tenderness.     Left Sinus: No maxillary sinus tenderness or frontal sinus tenderness.     Mouth/Throat:     Mouth: Mucous membranes are moist.     Pharynx: Oropharynx is clear.  Cardiovascular:     Rate and Rhythm: Normal rate and regular rhythm.     Heart sounds: Normal heart sounds.  Pulmonary:     Effort: Pulmonary effort is normal.     Breath sounds: Normal breath  sounds.  Abdominal:     General: Bowel sounds are normal. There is no distension.     Palpations: There is no mass.     Tenderness: There is abdominal tenderness in the epigastric area.     Hernia: No hernia is present.    Lymphadenopathy:     Cervical: No cervical adenopathy.  Neurological:     Mental Status: She is alert.     No results found for any visits on 10/29/22.      Assessment & Plan:   Problem List Items Addressed This Visit       Respiratory   Acute non-recurrent frontal sinusitis    History of the same.  Given length of illness 2 weeks will treat with Augmentin 875-125 mg twice daily.      Relevant Medications   amoxicillin-clavulanate (AUGMENTIN) 875-125 MG tablet     Digestive   Acute superficial gastritis without hemorrhage    Patient currently on omeprazole 20 mg.  Does have some epigastric tenderness may have some gastritis on top of exacerbating GERD will change omeprazole  20 mg daily to omeprazole 20 mg twice daily.  This is not beneficial consider doing Carafate at that juncture.  Patient has medication at home that she will use versus any other new prescription      Relevant Medications   omeprazole (PRILOSEC) 20 MG capsule     Other   Overweight - Primary    Patient working lifestyle modifications.  Does have controlled hypertension and hyperlipidemia.  Will try patient on phentermine 15 mg every morning.  She will follow-up in 1 month she can be on medication 3 months no more      Relevant Medications   phentermine 15 MG capsule    Meds ordered this encounter  Medications   amoxicillin-clavulanate (AUGMENTIN) 875-125 MG tablet    Sig: Take 1 tablet by mouth 2 (two) times daily for 7 days.    Dispense:  14 tablet    Refill:  0    Order Specific Question:   Supervising Provider    Answer:   Roxy Manns A [[id number]]   phentermine 15 MG capsule    Sig: Take 1 capsule (15 mg total) by mouth every morning.    Dispense:  30 capsule    Refill:  0    Order Specific Question:   Supervising Provider    Answer:   Roxy Manns A [[id number]]   omeprazole (PRILOSEC) 20 MG capsule    Sig: Take 1 capsule (20 mg total) by mouth 2 (two) times daily before a meal.    Dispense:  90 capsule    Refill:  1    Order Specific Question:   Supervising Provider    Answer:   Roxy Manns A [[id number]]    Return in about 4 weeks (around 11/26/2022) for BP recheck and weight recheck.  Audria Nine, NP

## 2022-10-29 NOTE — Patient Instructions (Signed)
Nice to see you today I have sent in an antibiotic and the weight loss medication You can take the omeprazole 20mg  twice a ay 30 mins before a meal Follow up in 1 month with me, sooner if you need me

## 2022-11-11 ENCOUNTER — Ambulatory Visit: Payer: 59 | Admitting: Dermatology

## 2022-11-11 VITALS — BP 126/70

## 2022-11-11 DIAGNOSIS — L814 Other melanin hyperpigmentation: Secondary | ICD-10-CM

## 2022-11-11 DIAGNOSIS — Z85820 Personal history of malignant melanoma of skin: Secondary | ICD-10-CM

## 2022-11-11 DIAGNOSIS — L609 Nail disorder, unspecified: Secondary | ICD-10-CM | POA: Diagnosis not present

## 2022-11-11 DIAGNOSIS — Z79899 Other long term (current) drug therapy: Secondary | ICD-10-CM | POA: Diagnosis not present

## 2022-11-11 DIAGNOSIS — Z86018 Personal history of other benign neoplasm: Secondary | ICD-10-CM

## 2022-11-11 DIAGNOSIS — Z7189 Other specified counseling: Secondary | ICD-10-CM

## 2022-11-11 DIAGNOSIS — L578 Other skin changes due to chronic exposure to nonionizing radiation: Secondary | ICD-10-CM | POA: Diagnosis not present

## 2022-11-11 DIAGNOSIS — L2089 Other atopic dermatitis: Secondary | ICD-10-CM

## 2022-11-11 DIAGNOSIS — Z85828 Personal history of other malignant neoplasm of skin: Secondary | ICD-10-CM | POA: Diagnosis not present

## 2022-11-11 DIAGNOSIS — Z1283 Encounter for screening for malignant neoplasm of skin: Secondary | ICD-10-CM | POA: Diagnosis not present

## 2022-11-11 MED ORDER — CLOBETASOL PROPIONATE 0.05 % EX CREA: 1.0000 | TOPICAL_CREAM | Freq: Two times a day (BID) | CUTANEOUS | 3 refills | Status: DC

## 2022-11-11 MED ORDER — OPZELURA 1.5 % EX CREA: 1.0000 | TOPICAL_CREAM | CUTANEOUS | 6 refills | Status: DC

## 2022-11-11 NOTE — Patient Instructions (Signed)
Due to recent changes in healthcare laws, you may see results of your pathology and/or laboratory studies on MyChart before the doctors have had a chance to review them. We understand that in some cases there may be results that are confusing or concerning to you. Please understand that not all results are received at the same time and often the doctors may need to interpret multiple results in order to provide you with the best plan of care or course of treatment. Therefore, we ask that you please give us 2 business days to thoroughly review all your results before contacting the office for clarification. Should we see a critical lab result, you will be contacted sooner.   If You Need Anything After Your Visit  If you have any questions or concerns for your doctor, please call our main line at [phone] and press option 4 to reach your doctor's medical assistant. If no one answers, please leave a voicemail as directed and we will return your call as soon as possible. Messages left after 4 pm will be answered the following business day.   You may also send us a message via MyChart. We typically respond to MyChart messages within 1-2 business days.  For prescription refills, please ask your pharmacy to contact our office. Our fax number is [phone].  If you have an urgent issue when the clinic is closed that cannot wait until the next business day, you can page your doctor at the number below.    Please note that while we do our best to be available for urgent issues outside of office hours, we are not available 24/7.   If you have an urgent issue and are unable to reach us, you may choose to seek medical care at your doctor's office, retail clinic, urgent care center, or emergency room.  If you have a medical emergency, please immediately call 911 or go to the emergency department.  Pager Numbers  - Dr. Kowalski: [phone]  - Dr. Moye: [phone]  - Dr. Stewart:  [phone]  In the event of inclement weather, please call our main line at [phone] for an update on the status of any delays or closures.  Dermatology Medication Tips: Please keep the boxes that topical medications come in in order to help keep track of the instructions about where and how to use these. Pharmacies typically print the medication instructions only on the boxes and not directly on the medication tubes.   If your medication is too expensive, please contact our office at [phone] option 4 or send us a message through MyChart.   We are unable to tell what your co-pay for medications will be in advance as this is different depending on your insurance coverage. However, we may be able to find a substitute medication at lower cost or fill out paperwork to get insurance to cover a needed medication.   If a prior authorization is required to get your medication covered by your insurance company, please allow us 1-2 business days to complete this process.  Drug prices often vary depending on where the prescription is filled and some pharmacies may offer cheaper prices.  The website www.goodrx.com contains coupons for medications through different pharmacies. The prices here do not account for what the cost may be with help from insurance (it may be cheaper with your insurance), but the website can give you the price if you did not use any insurance.  - You can print the associated coupon and take it with   your prescription to the pharmacy.  - You may also stop by our office during regular business hours and pick up a GoodRx coupon card.  - If you need your prescription sent electronically to a different pharmacy, notify our office through [facility] MyChart or by phone at [phone] option 4.     Si Usted Necesita Algo Despus de Su Visita  Tambin puede enviarnos un mensaje a travs de MyChart. Por lo general respondemos a los mensajes de MyChart en el transcurso de 1 a 2  das hbiles.  Para renovar recetas, por favor pida a su farmacia que se ponga en contacto con nuestra oficina. Nuestro nmero de fax es el [phone].  Si tiene un asunto urgente cuando la clnica est cerrada y que no puede esperar hasta el siguiente da hbil, puede llamar/localizar a su doctor(a) al nmero que aparece a continuacin.   Por favor, tenga en cuenta que aunque hacemos todo lo posible para estar disponibles para asuntos urgentes fuera del horario de oficina, no estamos disponibles las 24 horas del da, los 7 das de la semana.   Si tiene un problema urgente y no puede comunicarse con nosotros, puede optar por buscar atencin mdica  en el consultorio de su doctor(a), en una clnica privada, en un centro de atencin urgente o en una sala de emergencias.  Si tiene una emergencia mdica, por favor llame inmediatamente al 911 o vaya a la sala de emergencias.  Nmeros de bper  - Dr. Kowalski: [phone]  - Dra. Moye: [phone]  - Dra. Stewart: [phone]  En caso de inclemencias del tiempo, por favor llame a nuestra lnea principal al [phone] para una actualizacin sobre el estado de cualquier retraso o cierre.  Consejos para la medicacin en dermatologa: Por favor, guarde las cajas en las que vienen los medicamentos de uso tpico para ayudarle a seguir las instrucciones sobre dnde y cmo usarlos. Las farmacias generalmente imprimen las instrucciones del medicamento slo en las cajas y no directamente en los tubos del medicamento.   Si su medicamento es muy caro, por favor, pngase en contacto con nuestra oficina llamando al [phone] y presione la opcin 4 o envenos un mensaje a travs de MyChart.   No podemos decirle cul ser su copago por los medicamentos por adelantado ya que esto es diferente dependiendo de la cobertura de su seguro. Sin embargo, es posible que podamos encontrar un medicamento sustituto a menor costo o llenar un formulario para que el  seguro cubra el medicamento que se considera necesario.   Si se requiere una autorizacin previa para que su compaa de seguros cubra su medicamento, por favor permtanos de 1 a 2 das hbiles para completar este proceso.  Los precios de los medicamentos varan con frecuencia dependiendo del lugar de dnde se surte la receta y alguna farmacias pueden ofrecer precios ms baratos.  El sitio web www.goodrx.com tiene cupones para medicamentos de diferentes farmacias. Los precios aqu no tienen en cuenta lo que podra costar con la ayuda del seguro (puede ser ms barato con su seguro), pero el sitio web puede darle el precio si no utiliz ningn seguro.  - Puede imprimir el cupn correspondiente y llevarlo con su receta a la farmacia.  - Tambin puede pasar por nuestra oficina durante el horario de atencin regular y recoger una tarjeta de cupones de GoodRx.  - Si necesita que su receta se enve electrnicamente a una farmacia diferente, informe a nuestra oficina a travs de MyChart de    o por telfono llamando al [phone] y presione la opcin 4.  

## 2022-11-11 NOTE — Progress Notes (Signed)
Follow-Up Visit   Subjective  Judy Huang is a 57 y.o. female who presents for the following: Skin Cancer Screening and Full Body Skin Exam, hx of Melanoma IS L post shoulder, hx of BCCs, Hx of Dysplastic nevus, Atopic derm with Psoriasis overlap hands flaring, clobetasol cr The patient presents for Total-Body Skin Exam (TBSE) for skin cancer screening and mole check. The patient has spots, moles and lesions to be evaluated, some may be new or changing and the patient has concerns that these could be cancer.  The following portions of the chart were reviewed this encounter and updated as appropriate: medications, allergies, medical history  Review of Systems:  No other skin or systemic complaints except as noted in HPI or Assessment and Plan.  Objective  Well appearing patient in no apparent distress; mood and affect are within normal limits.  A full examination was performed including scalp, head, eyes, ears, nose, lips, neck, chest, axillae, abdomen, back, buttocks, bilateral upper extremities, bilateral lower extremities, hands, feet, fingers, toes, fingernails, and toenails. All findings within normal limits unless otherwise noted below.   Relevant physical exam findings are noted in the Assessment and Plan.   Assessment & Plan   LENTIGINES, SEBORRHEIC KERATOSES, HEMANGIOMAS - Benign normal skin lesions - Benign-appearing - Call for any changes  MELANOCYTIC NEVI - Tan-brown and/or pink-flesh-colored symmetric macules and papules - Benign appearing on exam today - Observation - Call clinic for new or changing moles - Recommend daily use of broad spectrum spf 30+ sunscreen to sun-exposed areas.   ACTINIC DAMAGE - Chronic condition, secondary to cumulative UV/sun exposure - diffuse scaly erythematous macules with underlying dyspigmentation - Recommend daily broad spectrum sunscreen SPF 30+ to sun-exposed areas, reapply every 2 hours as needed.  - Staying in the shade or  wearing long sleeves, sun glasses (UVA+UVB protection) and wide brim hats (4-inch brim around the entire circumference of the hat) are also recommended for sun protection.  - Call for new or changing lesions.  SKIN CANCER SCREENING PERFORMED TODAY.  ATOPIC DERMATITIS with overlap of Psoriasis  Chronic and persistent condition with duration or expected duration over one year. Condition is bothersome/symptomatic for patient.  Improved. but not at goal.  Atopic dermatitis (eczema) is a chronic, relapsing, pruritic condition that can significantly affect quality of life. It is often associated with allergic rhinitis and/or asthma and can require treatment with topical medications, phototherapy, or in severe cases biologic injectable medication ([name]; [name]) or Oral JAK inhibitors.  Sotyku, Otzela and [name] didn't help, [name] and [name] not covered by [employer]. [name] helped clear her hands.   She had experienced some joint pain that may have been associated with [name] use. She has discontinued [name] and only using topicals.  Exam: pinkness and peeling of palms, R foot  Treatment Plan: Start Opzelura qd/bid aa hands  Cont Clobetasol cr 3d/wk aa hands prn flares, avoid f/g/a Recommend gentle skin care.   HISTORY OF MELANOMA IN SITU - No evidence of recurrence today - Recommend regular full body skin exams - Recommend daily broad spectrum sunscreen SPF 30+ to sun-exposed areas, reapply every 2 hours as needed.  - Call if any new or changing lesions are noted between office visits  - L post shoulder, excised 02/10/22  HISTORY OF BASAL CELL CARCINOMA OF THE SKIN - No evidence of recurrence today - Recommend regular full body skin exams - Recommend daily broad spectrum sunscreen SPF 30+ to sun-exposed areas, reapply every 2 hours as  needed.  - Call if any new or changing lesions are noted between office visits  - L shoulder top of shoulder, L distal ant thigh, L upper back  paraspinal, R infrascapula, R mid back braline  HISTORY OF DYSPLASTIC NEVUS No evidence of recurrence today Recommend regular full body skin exams Recommend daily broad spectrum sunscreen SPF 30+ to sun-exposed areas, reapply every 2 hours as needed.  Call if any new or changing lesions are noted between office visits  - LLQA  NAIL PROBLEM Secondary to traumatic injury/door fell on toe 79yrs ago vs Tinea Unguium  Exam: dystrophic nail, R great toenail Treatment Plan: Recommend nail molecular study when nail grows out   Return in about 3 months (around 02/10/2023) for TBSE, Hx of Melanoma IS, Hx of BCC, Hx of Dysplastic nevi, recheck/culture R great toenail.  I, Ardis Rowan, RMA, am acting as scribe for Armida Sans, MD .  Documentation: I have reviewed the above documentation for accuracy and completeness, and I agree with the above.  Armida Sans, MD

## 2022-11-17 ENCOUNTER — Encounter: Payer: Self-pay | Admitting: Dermatology

## 2022-11-18 ENCOUNTER — Other Ambulatory Visit: Payer: Self-pay

## 2022-11-18 MED ORDER — OPZELURA 1.5 % EX CREA: 1.0000 | TOPICAL_CREAM | CUTANEOUS | 6 refills | Status: DC

## 2022-11-18 NOTE — Progress Notes (Signed)
Patient left msg requesting RX to be transferred to [facility]. aw

## 2022-11-27 ENCOUNTER — Ambulatory Visit (INDEPENDENT_AMBULATORY_CARE_PROVIDER_SITE_OTHER): Payer: 59 | Admitting: Nurse Practitioner

## 2022-11-27 ENCOUNTER — Encounter: Payer: Self-pay | Admitting: Nurse Practitioner

## 2022-11-27 VITALS — BP 128/70 | HR 82 | Temp 99.1°F | Resp 16 | Ht 64.0 in | Wt 162.1 lb

## 2022-11-27 DIAGNOSIS — E663 Overweight: Secondary | ICD-10-CM | POA: Diagnosis not present

## 2022-11-27 DIAGNOSIS — K2900 Acute gastritis without bleeding: Secondary | ICD-10-CM | POA: Diagnosis not present

## 2022-11-27 DIAGNOSIS — E785 Hyperlipidemia, unspecified: Secondary | ICD-10-CM

## 2022-11-27 DIAGNOSIS — I10 Essential (primary) hypertension: Secondary | ICD-10-CM | POA: Diagnosis not present

## 2022-11-27 MED ORDER — PHENTERMINE HCL 37.5 MG PO CAPS
37.5000 mg | ORAL_CAPSULE | ORAL | 0 refills | Status: DC
Start: 2022-11-27 — End: 2022-12-23

## 2022-11-27 NOTE — Patient Instructions (Signed)
Nice to see you today I increased the phentermine to 37.5mg  daily Follow up with me in 1 month, sooner if you need me

## 2022-11-27 NOTE — Assessment & Plan Note (Addendum)
Patient has had approximately a 5 pound weight decrease while on phentermine.  Will increase the dose as it seems more effective to 37.5 mg daily follow-up 1 month

## 2022-11-27 NOTE — Assessment & Plan Note (Signed)
Symptoms have improved with omeprazole 20 mg twice daily will continue for the next 2 months and then titrate down to omeprazole 20 mg daily

## 2022-11-27 NOTE — Assessment & Plan Note (Signed)
Comorbidities reviewed by weight.  Patient is working on lifestyle modifications and using phentermine.  She does have help to being able to come off of cholesterol medicine and blood pressure medicine.

## 2022-11-27 NOTE — Progress Notes (Signed)
Established Patient Office Visit  Subjective   Patient ID: Judy Huang, female    DOB: 04-30-1966  Age: 57 y.o. MRN: [id number]  Chief Complaint  Patient presents with   Weight Check   Hypertension    Hypertension Pertinent negatives include no chest pain, headaches, palpitations or shortness of breath.    Obesity: At last office visit we did review lifestyle with patient.  States that she has been working on her eating habits and going to the gym.  She has tried a Mediterranean style eating plan and was unable to lose weight states she had used phentermine 37.5 mg for couple weeks and tolerated well She lost a couple pounds per her report 6 to 7 pounds was able to maintain the weight loss after running out of the medication.  Patient was started on phentermine last office visit and is here for recheck.  Of note patient is hypertensive and currently medicated for same. States that she is still going to the gym and states that she is feeling better. States that she is trying to get to 145.  Stomach issue: Patient was started on omeprazole 20 mg twice daily last office visit doing with her stomach issues.  Patient was previously on omeprazole 20 mg daily.    Review of Systems  Constitutional:  Negative for chills and fever.  Respiratory:  Negative for shortness of breath.   Cardiovascular:  Negative for chest pain and palpitations.  Neurological:  Negative for dizziness and headaches.  Psychiatric/Behavioral:  The patient has insomnia (baseline).       Objective:     BP 128/70   Pulse 82   Temp 99.1 F (37.3 C)   Resp 16   Ht 5\' 4"  (1.626 m)   Wt 162 lb 2 oz (73.5 kg)   SpO2 96%   BMI 27.83 kg/m  BP Readings from Last 3 Encounters:  11/27/22 128/70  11/11/22 126/70  10/29/22 130/70   Wt Readings from Last 3 Encounters:  11/27/22 162 lb 2 oz (73.5 kg)  10/29/22 167 lb 2 oz (75.8 kg)  09/22/22 170 lb (77.1 kg)      Physical Exam Vitals and nursing note  reviewed.  Constitutional:      Appearance: Normal appearance.  Cardiovascular:     Rate and Rhythm: Normal rate and regular rhythm.     Heart sounds: Normal heart sounds.  Pulmonary:     Effort: Pulmonary effort is normal.     Breath sounds: Normal breath sounds.  Abdominal:     General: Bowel sounds are normal. There is no distension.     Palpations: There is no mass.     Tenderness: There is no abdominal tenderness.     Hernia: No hernia is present.  Neurological:     Mental Status: She is alert.      No results found for any visits on 11/27/22.    The 10-year ASCVD risk score (Arnett DK, et al., 2019) is: 7.6%    Assessment & Plan:   Problem List Items Addressed This Visit       Cardiovascular and Mediastinum   Essential hypertension    Currently well-controlled on hydrochlorothiazide 12.5 mg daily.        Digestive   Acute superficial gastritis without hemorrhage    Symptoms have improved with omeprazole 20 mg twice daily will continue for the next 2 months and then titrate down to omeprazole 20 mg daily  Other   HLD (hyperlipidemia)    Comorbidities reviewed by weight.  Patient is working on lifestyle modifications and using phentermine.  She does have help to being able to come off of cholesterol medicine and blood pressure medicine.      Overweight - Primary    Patient has had approximately a 5 pound weight decrease while on phentermine.  Will increase the dose as it seems more effective to 37.5 mg daily follow-up 1 month      Relevant Medications   phentermine 37.5 MG capsule    Return in about 4 weeks (around 12/25/2022) for Weight recheck .    Audria Nine, NP

## 2022-11-27 NOTE — Assessment & Plan Note (Signed)
Currently well-controlled on hydrochlorothiazide 12.5 mg daily.

## 2022-12-23 ENCOUNTER — Encounter: Payer: Self-pay | Admitting: Nurse Practitioner

## 2022-12-23 ENCOUNTER — Ambulatory Visit (INDEPENDENT_AMBULATORY_CARE_PROVIDER_SITE_OTHER): Payer: 59 | Admitting: Nurse Practitioner

## 2022-12-23 VITALS — BP 118/82 | HR 87 | Temp 97.7°F | Resp 16 | Ht 64.0 in | Wt 157.2 lb

## 2022-12-23 DIAGNOSIS — E785 Hyperlipidemia, unspecified: Secondary | ICD-10-CM | POA: Diagnosis not present

## 2022-12-23 DIAGNOSIS — E663 Overweight: Secondary | ICD-10-CM

## 2022-12-23 DIAGNOSIS — Z6826 Body mass index (BMI) 26.0-26.9, adult: Secondary | ICD-10-CM | POA: Diagnosis not present

## 2022-12-23 DIAGNOSIS — I10 Essential (primary) hypertension: Secondary | ICD-10-CM

## 2022-12-23 MED ORDER — PHENTERMINE HCL 37.5 MG PO CAPS
37.5000 mg | ORAL_CAPSULE | ORAL | 0 refills | Status: DC
Start: 2022-12-23 — End: 2023-04-28

## 2022-12-23 NOTE — Patient Instructions (Signed)
Nice to see you today We will hold the cholesterol medication until I check your labs at your physical. I want you to continue the blood pressure medication for now.  Follow up in approx 4 months for physical and labs

## 2022-12-23 NOTE — Assessment & Plan Note (Signed)
Patient has lost approximate 10 pounds being on phentermine 37.5 mg for 2 months will continue for 1 more month.  Continue lifestyle modifications inclusive of diet and exercise.

## 2022-12-23 NOTE — Assessment & Plan Note (Signed)
Patient like to come off rosuvastatin 5 mg.  We will put medication on hold at this juncture recheck lipids in her physical which is approximately 4 months see how lifestyle modifications are doing.

## 2022-12-23 NOTE — Assessment & Plan Note (Signed)
Patient is requesting to come off of blood pressure medication.  We will hold her on the same dose for now and to make sure that the weight and lifestyle changes are sustainable.  Previous visit next office visit was reviewed patient's physical

## 2022-12-23 NOTE — Progress Notes (Signed)
   Established Patient Office Visit  Subjective   Patient ID: Judy Huang, female    DOB: [birth year]  Age: 57 y.o. MRN: [id number]  Chief Complaint  Patient presents with   Weight Check    HPI  Obesity: Patient has been seen by me on 10/29/2022 and 11/27/2022 patient was placed on phentermine 15 mg in the beginning and increased to 37.5 mg thereafter.  Patient is here for recheck.  Patient has lost approximately 5 pounds since last office a total of 10 pounds and starting phentermine.  States that she has banana and yougurt for breakfast and lunch is a salad. Dinner is what she wants and is home cooked.  States that she is doing pilates at home. She uses the app. She has a stationary bike. She will walk a miles 5 times a week     Review of Systems  Constitutional:  Negative for chills and fever.  Respiratory:  Negative for shortness of breath.   Cardiovascular:  Negative for chest pain.  Neurological:  Negative for headaches.      Objective:     BP 118/82   Pulse 87   Temp 97.7 F (36.5 C)   Resp 16   Ht 5\' 4"  (1.626 m)   Wt 157 lb 4 oz (71.3 kg)   SpO2 96%   BMI 26.99 kg/m  BP Readings from Last 3 Encounters:  12/23/22 118/82  11/27/22 128/70  11/11/22 126/70   Wt Readings from Last 3 Encounters:  12/23/22 157 lb 4 oz (71.3 kg)  11/27/22 162 lb 2 oz (73.5 kg)  10/29/22 167 lb 2 oz (75.8 kg)      Physical Exam Vitals and nursing note reviewed.  Constitutional:      Appearance: Normal appearance.  Cardiovascular:     Rate and Rhythm: Normal rate and regular rhythm.     Heart sounds: Normal heart sounds.  Pulmonary:     Effort: Pulmonary effort is normal.     Breath sounds: Normal breath sounds.  Neurological:     Mental Status: She is alert.      No results found for any visits on 12/23/22.    The 10-year ASCVD risk score (Arnett DK, et al., 2019) is: 6.5%    Assessment & Plan:   Problem List Items Addressed This Visit        Cardiovascular and Mediastinum   Essential hypertension - Primary    Patient is requesting to come off of blood pressure medication.  We will hold her on the same dose for now and to make sure that the weight and lifestyle changes are sustainable.  Previous visit next office visit was reviewed patient's physical        Other   HLD (hyperlipidemia)    Patient like to come off rosuvastatin 5 mg.  We will put medication on hold at this juncture recheck lipids in her physical which is approximately 4 months see how lifestyle modifications are doing.      Overweight    Patient has lost approximate 10 pounds being on phentermine 37.5 mg for 2 months will continue for 1 more month.  Continue lifestyle modifications inclusive of diet and exercise.      Relevant Medications   phentermine 37.5 MG capsule    Return in about 4 months (around 04/25/2023) for CPE and Labs.    Audria Nine, NP

## 2022-12-31 ENCOUNTER — Ambulatory Visit: Payer: 59 | Admitting: Nurse Practitioner

## 2023-02-08 DIAGNOSIS — N951 Menopausal and female climacteric states: Secondary | ICD-10-CM | POA: Diagnosis not present

## 2023-02-08 DIAGNOSIS — Z825 Family history of asthma and other chronic lower respiratory diseases: Secondary | ICD-10-CM | POA: Diagnosis not present

## 2023-02-08 DIAGNOSIS — I10 Essential (primary) hypertension: Secondary | ICD-10-CM | POA: Diagnosis not present

## 2023-02-08 DIAGNOSIS — J45909 Unspecified asthma, uncomplicated: Secondary | ICD-10-CM | POA: Diagnosis not present

## 2023-02-08 DIAGNOSIS — Z809 Family history of malignant neoplasm, unspecified: Secondary | ICD-10-CM | POA: Diagnosis not present

## 2023-02-08 DIAGNOSIS — Z85820 Personal history of malignant melanoma of skin: Secondary | ICD-10-CM | POA: Diagnosis not present

## 2023-02-08 DIAGNOSIS — Z8249 Family history of ischemic heart disease and other diseases of the circulatory system: Secondary | ICD-10-CM | POA: Diagnosis not present

## 2023-02-08 DIAGNOSIS — K219 Gastro-esophageal reflux disease without esophagitis: Secondary | ICD-10-CM | POA: Diagnosis not present

## 2023-02-08 DIAGNOSIS — E785 Hyperlipidemia, unspecified: Secondary | ICD-10-CM | POA: Diagnosis not present

## 2023-02-08 DIAGNOSIS — L409 Psoriasis, unspecified: Secondary | ICD-10-CM | POA: Diagnosis not present

## 2023-02-18 ENCOUNTER — Ambulatory Visit: Payer: 59 | Admitting: Dermatology

## 2023-02-18 VITALS — BP 143/86 | HR 86 | Temp 80.0°F

## 2023-02-18 DIAGNOSIS — Z86006 Personal history of melanoma in-situ: Secondary | ICD-10-CM

## 2023-02-18 DIAGNOSIS — D492 Neoplasm of unspecified behavior of bone, soft tissue, and skin: Secondary | ICD-10-CM

## 2023-02-18 DIAGNOSIS — L821 Other seborrheic keratosis: Secondary | ICD-10-CM

## 2023-02-18 DIAGNOSIS — L578 Other skin changes due to chronic exposure to nonionizing radiation: Secondary | ICD-10-CM

## 2023-02-18 DIAGNOSIS — Z86018 Personal history of other benign neoplasm: Secondary | ICD-10-CM

## 2023-02-18 DIAGNOSIS — L820 Inflamed seborrheic keratosis: Secondary | ICD-10-CM | POA: Diagnosis not present

## 2023-02-18 DIAGNOSIS — L814 Other melanin hyperpigmentation: Secondary | ICD-10-CM

## 2023-02-18 DIAGNOSIS — L304 Erythema intertrigo: Secondary | ICD-10-CM | POA: Diagnosis not present

## 2023-02-18 DIAGNOSIS — Z7189 Other specified counseling: Secondary | ICD-10-CM

## 2023-02-18 DIAGNOSIS — W908XXA Exposure to other nonionizing radiation, initial encounter: Secondary | ICD-10-CM

## 2023-02-18 DIAGNOSIS — L700 Acne vulgaris: Secondary | ICD-10-CM | POA: Diagnosis not present

## 2023-02-18 DIAGNOSIS — L2089 Other atopic dermatitis: Secondary | ICD-10-CM

## 2023-02-18 DIAGNOSIS — D229 Melanocytic nevi, unspecified: Secondary | ICD-10-CM

## 2023-02-18 DIAGNOSIS — Z85828 Personal history of other malignant neoplasm of skin: Secondary | ICD-10-CM

## 2023-02-18 DIAGNOSIS — Z1283 Encounter for screening for malignant neoplasm of skin: Secondary | ICD-10-CM

## 2023-02-18 DIAGNOSIS — Z85820 Personal history of malignant melanoma of skin: Secondary | ICD-10-CM

## 2023-02-18 DIAGNOSIS — L720 Epidermal cyst: Secondary | ICD-10-CM

## 2023-02-18 DIAGNOSIS — D1801 Hemangioma of skin and subcutaneous tissue: Secondary | ICD-10-CM

## 2023-02-18 DIAGNOSIS — Z79899 Other long term (current) drug therapy: Secondary | ICD-10-CM

## 2023-02-18 MED ORDER — MUPIROCIN 2 % EX OINT: TOPICAL_OINTMENT | CUTANEOUS | 0 refills | Status: AC

## 2023-02-18 MED ORDER — TRETINOIN 0.025 % EX CREA: TOPICAL_CREAM | Freq: Every day | CUTANEOUS | 2 refills | Status: DC

## 2023-02-18 MED ORDER — OPZELURA 1.5 % EX CREA: 1.0000 | TOPICAL_CREAM | CUTANEOUS | 6 refills | Status: AC

## 2023-02-18 MED ORDER — KETOCONAZOLE 2 % EX CREA: 1.0000 | TOPICAL_CREAM | Freq: Two times a day (BID) | CUTANEOUS | 0 refills | Status: DC

## 2023-02-18 NOTE — Progress Notes (Signed)
Follow-Up Visit   Subjective  Judy Huang is a 57 y.o. female who presents for the following: Skin Cancer Screening and Full Body Skin Exam, hx of Melanoma in situ. The patient presents for Total-Body Skin Exam (TBSE) for skin cancer screening and mole check. The patient has spots, moles and lesions to be evaluated, some may be new or changing and the patient may have concern these could be cancer.  The following portions of the chart were reviewed this encounter and updated as appropriate: medications, allergies, medical history  Review of Systems:  No other skin or systemic complaints except as noted in HPI or Assessment and Plan.  Objective  Well appearing patient in no apparent distress; mood and affect are within normal limits.  A full examination was performed including scalp, head, eyes, ears, nose, lips, neck, chest, axillae, abdomen, back, buttocks, bilateral upper extremities, bilateral lower extremities, hands, feet, fingers, toes, fingernails, and toenails. All findings within normal limits unless otherwise noted below.   Relevant physical exam findings are noted in the Assessment and Plan.  face x 17, right thigh x 1 (18) Stuck-on, waxy, tan-brown papules--Discussed benign etiology and prognosis.   mid back bra line left paraspinal 1.5 cm Red papule        Assessment & Plan   SKIN CANCER SCREENING PERFORMED TODAY.  ACTINIC DAMAGE - Chronic condition, secondary to cumulative UV/sun exposure - diffuse scaly erythematous macules with underlying dyspigmentation - Recommend daily broad spectrum sunscreen SPF 30+ to sun-exposed areas, reapply every 2 hours as needed.  - Staying in the shade or wearing long sleeves, sun glasses (UVA+UVB protection) and wide brim hats (4-inch brim around the entire circumference of the hat) are also recommended for sun protection.  - Call for new or changing lesions.  LENTIGINES, SEBORRHEIC KERATOSES, HEMANGIOMAS - Benign normal skin  lesions - Benign-appearing - Call for any changes  MELANOCYTIC NEVI - Tan-brown and/or pink-flesh-colored symmetric macules and papules - Benign appearing on exam today - Observation - Call clinic for new or changing moles - Recommend daily use of broad spectrum spf 30+ sunscreen to sun-exposed areas.   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 Condition is bothersome/symptomatic for patient. Currently flared.  Treatment Plan: Start Retin A 0.25% cream apply to face at bedtime   May consider Isotretinoin in the future -  discussed   INTERTRIGO Exam Erythematous macerated patches inframammary  Chronic and persistent condition with duration or expected duration over one year. Condition is symptomatic/ bothersome to patient. Not currently at goal.  Intertrigo is a chronic recurrent rash that occurs in skin fold areas that may be associated with friction; heat; moisture; yeast; fungus; and bacteria.  It is exacerbated by increased movement / activity; sweating; and higher atmospheric temperature.  Treatment Plan Start Ketoconazole cream apply beneath breast once a day    Atopic dermatitis,  B/L Hands, feet, inframammary Atopic dermatitis with overlap of Psoriasis    Chronic and persistent condition with duration or expected duration over one year. Condition is bothersome/symptomatic for patient.  Improved. but not at goal.    Judy Huang and [name] didn't help, [name] not covered by patient's insurance.    Start Opzelura cream apply to affected skin once a day    HISTORY OF DYSPLASTIC NEVUS No evidence of recurrence today Recommend regular full body skin exams Recommend daily broad spectrum sunscreen SPF 30+ to sun-exposed areas, reapply every  2 hours as needed.  Call if any new or changing lesions are noted between office visits   HISTORY OF BASAL CELL CARCINOMA OF THE  SKIN L shoulder top of shoulder, L distal ant thigh, L upper back paraspinal, R infrascapula, R mid back braline  - No evidence of recurrence today - Recommend regular full body skin exams - Recommend daily broad spectrum sunscreen SPF 30+ to sun-exposed areas, reapply every 2 hours as needed.  - Call if any new or changing lesions are noted between office visits   HISTORY OF MELANOMA IN SITU Left post shoulder 11/24/2021 - No evidence of recurrence today - Recommend regular full body skin exams - Recommend daily broad spectrum sunscreen SPF 30+ to sun-exposed areas, reapply every 2 hours as needed.  - Call if any new or changing lesions are noted between office visits   Inflamed seborrheic keratosis (18) face x 17, right thigh x 1  Symptomatic, irritating, patient would like treated.   Destruction of lesion - face x 17, right thigh x 1 (18) Complexity: simple   Destruction method: cryotherapy   Informed consent: discussed and consent obtained   Timeout:  patient name, date of birth, surgical site, and procedure verified Lesion destroyed using liquid nitrogen: Yes   Region frozen until ice ball extended beyond lesion: Yes   Outcome: patient tolerated procedure well with no complications   Post-procedure details: wound care instructions given    Neoplasm of skin mid back bra line left paraspinal  Skin excision  Total excision diameter (cm):  1.5 Informed consent: discussed and consent obtained   Timeout: patient name, date of birth, surgical site, and procedure verified   Procedure prep:  Patient was prepped and draped in usual sterile fashion Prep type:  Isopropyl alcohol and povidone-iodine Anesthesia: the lesion was anesthetized in a standard fashion   Anesthetic:  1% lidocaine w/ epinephrine 1-100,000 buffered w/ 8.4% NaHCO3 Instrument used: #15 blade   Hemostasis achieved with: pressure   Hemostasis achieved with comment:  Electrocautery Outcome: patient tolerated  procedure well with no complications   Post-procedure details: sterile dressing applied and wound care instructions given   Dressing type: bandage and pressure dressing (Mupirocin)    Specimen 1 - Surgical pathology Differential Diagnosis: R/O BCC vs Cyst 2 specimens in container  Check Margins: No  Skin cancer screening  Actinic skin damage  History of basal cell carcinoma  History of malignant melanoma  History of dysplastic nevus  Lentigo  Melanocytic nevus, unspecified location  Other atopic dermatitis  Intertrigo  Acne vulgaris  Medication management   Return in about 6 months (around 08/21/2023) for TBSE, hx of Melanoma in situ .  IAngelique Holm, CMA, am acting as scribe for Armida Sans, MD .   Documentation: I have reviewed the above documentation for accuracy and completeness, and I agree with the above.  Armida Sans, MD

## 2023-02-18 NOTE — Patient Instructions (Addendum)
Wound Care Instructions  Cleanse wound gently with soap and water once a day then pat dry with clean gauze. Apply a thin coat of Petrolatum (petroleum jelly, "Vaseline") over the wound (unless you have an allergy to this). We recommend that you use a new, sterile tube of Vaseline. Do not pick or remove scabs. Do not remove the yellow or white "healing tissue" from the base of the wound.  Cover the wound with fresh, clean, nonstick gauze and secure with paper tape. You may use Band-Aids in place of gauze and tape if the wound is small enough, but would recommend trimming much of the tape off as there is often too much. Sometimes Band-Aids can irritate the skin.  You should call the office for your biopsy report after 1 week if you have not already been contacted.  If you experience any problems, such as abnormal amounts of bleeding, swelling, significant bruising, significant pain, or evidence of infection, please call the office immediately.  FOR ADULT SURGERY PATIENTS: If you need something for pain relief you may take 1 extra strength Tylenol (acetaminophen) AND 2 Ibuprofen (200mg  each) together every 4 hours as needed for pain. (do not take these if you are allergic to them or if you have a reason you should not take them.) Typically, you may only need pain medication for 1 to 3 days.         Cryotherapy Aftercare  Wash gently with soap and water everyday.   Apply Vaseline and Band-Aid daily until healed.     Due to recent changes in healthcare laws, you may see results of your pathology and/or laboratory studies on MyChart before the doctors have had a chance to review them. We understand that in some cases there may be results that are confusing or concerning to you. Please understand that not all results are received at the same time and often the doctors may need to interpret multiple results in order to provide you with the best plan of care or course of treatment. Therefore, we ask  that you please give [name] 2 business days to thoroughly review all your results before contacting the office for clarification. Should we see a critical lab result, you will be contacted sooner.   If You Need Anything After Your Visit  If you have any questions or concerns for your doctor, please call our main line at [phone] and press option 4 to reach your doctor's medical assistant. If no one answers, please leave a voicemail as directed and we will return your call as soon as possible. Messages left after 4 pm will be answered the following business day.   You may also send [name] a message via MyChart. We typically respond to MyChart messages within 1-2 business days.  For prescription refills, please ask your pharmacy to contact our office. Our fax number is [phone].  If you have an urgent issue when the clinic is closed that cannot wait until the next business day, you can page your doctor at the number below.    Please note that while we do our best to be available for urgent issues outside of office hours, we are not available 24/7.   If you have an urgent issue and are unable to reach [name], you may choose to seek medical care at your doctor's office, retail clinic, urgent care center, or emergency room.  If you have a medical emergency, please immediately call 911 or go to the emergency department.  Pager Numbers  -  Dr. Gwen Pounds: [phone]  - Dr. Roseanne Reno: [phone]  In the event of inclement weather, please call our main line at [phone] for an update on the status of any delays or closures.  Dermatology Medication Tips: Please keep the boxes that topical medications come in in order to help keep track of the instructions about where and how to use these. Pharmacies typically print the medication instructions only on the boxes and not directly on the medication tubes.   If your medication is too expensive, please contact our office at [phone] option 4 or send  [name] a message through MyChart.   We are unable to tell what your co-pay for medications will be in advance as this is different depending on your insurance coverage. However, we may be able to find a substitute medication at lower cost or fill out paperwork to get insurance to cover a needed medication.   If a prior authorization is required to get your medication covered by your insurance company, please allow [name] 1-2 business days to complete this process.  Drug prices often vary depending on where the prescription is filled and some pharmacies may offer cheaper prices.  The website www.goodrx.com contains coupons for medications through different pharmacies. The prices here do not account for what the cost may be with help from insurance (it may be cheaper with your insurance), but the website can give you the price if you did not use any insurance.  - You can print the associated coupon and take it with your prescription to the pharmacy.  - You may also stop by our office during regular business hours and pick up a GoodRx coupon card.  - If you need your prescription sent electronically to a different pharmacy, notify our office through [facility] or by phone at [phone] option 4.     Si Usted Necesita Algo Despus de Su Visita  Tambin puede enviarnos un mensaje a travs de Clinical cytogeneticist. Por lo general respondemos a los mensajes de MyChart en el transcurso de 1 a 2 das hbiles.  Para renovar recetas, por favor pida a su farmacia que se ponga en contacto con nuestra oficina. Judy Huang de fax es [place] [phone].  Si tiene un asunto urgente cuando la clnica est cerrada y que no puede esperar hasta el siguiente da hbil, puede llamar/localizar a su doctor(a) al nmero que aparece a continuacin.   Por favor, tenga en cuenta que aunque hacemos todo lo posible para estar disponibles para asuntos urgentes fuera del horario de [place], no estamos disponibles las 24 horas del da,  los 7 [address]  [address] de la [place].   Si tiene un problema urgente y no puede comunicarse con nosotros, puede optar por buscar atencin mdica  en el consultorio de su doctor(a), en una clnica privada, en un centro de atencin urgente o en una sala de emergencias.  Si tiene Engineer, drilling, por favor llame inmediatamente al 911 o vaya a la sala de emergencias.  Nmeros de bper  - Dr. Gwen Pounds: [phone]  - Dra. Roseanne Reno: [phone]  En caso de inclemencias del [place], por favor llame a Lacy Duverney principal al [phone] para una actualizacin sobre el [place] de cualquier retraso o cierre.  Consejos para la medicacin en dermatologa: Por favor, guarde las cajas en las que vienen los medicamentos de uso tpico para ayudarle a seguir las instrucciones sobre dnde y cmo usarlos. Las farmacias generalmente imprimen las instrucciones del medicamento slo en las cajas y no directamente en los tubos  del medicamento.   Si su medicamento es muy caro, por favor, pngase en contacto con Rolm Gala llamando al [phone] y presione la opcin 4 o envenos un mensaje a travs de Clinical cytogeneticist.   No podemos decirle cul ser su copago por los medicamentos por adelantado ya que esto es diferente dependiendo de la cobertura de su seguro. Sin embargo, es posible que podamos encontrar un medicamento sustituto a Audiological scientist un formulario para que el seguro cubra el medicamento que se considera necesario.   Si se requiere una autorizacin previa para que su compaa de seguros [place] su medicamento, por favor permtanos de 1 a 2 das hbiles para completar [address]  Los precios de los medicamentos varan con frecuencia dependiendo del Environmental consultant de dnde se surte la receta y alguna farmacias pueden ofrecer precios ms baratos.  El sitio web www.goodrx.com tiene cupones para medicamentos de Health and safety inspector. Los precios aqu no tienen en cuenta lo que podra costar con la ayuda del seguro  (puede ser ms barato con su seguro), pero el sitio web puede darle el precio si no utiliz Tourist information centre manager.  - Puede imprimir el cupn correspondiente y llevarlo con su receta a la farmacia.  - Tambin puede pasar por nuestra oficina durante el horario de atencin regular y Education officer, museum una tarjeta de cupones de GoodRx.  - Si necesita que su receta se enve electrnicamente a una farmacia diferente, informe a nuestra oficina a travs de MyChart de [place] o por telfono llamando al [phone] y presione la opcin 4.

## 2023-02-23 ENCOUNTER — Encounter: Payer: Self-pay | Admitting: Dermatology

## 2023-03-02 ENCOUNTER — Telehealth: Payer: Self-pay

## 2023-03-02 NOTE — Telephone Encounter (Signed)
Advised pt of bx results/sh ?

## 2023-03-02 NOTE — Telephone Encounter (Signed)
-----   Message from Armida Sans sent at 03/01/2023  6:11 PM EDT ----- Diagnosis Skin (M), mid back bra line left paraspinal EPIDERMOID CYST, INFLAMED AND DISRUPTED  Benign inflamed ruptured cyst No further treatment needed May recur Recheck next visit

## 2023-03-31 NOTE — Progress Notes (Deleted)
57 y.o. G20P0032 Widowed Caucasian female here for annual exam.    PCP:     No LMP recorded. Patient has had a hysterectomy.           Sexually active: {yes no:314532}  The current method of family planning is status post hysterectomy.    Exercising: {yes no:314532}  {types:19826} Smoker:  former  Health Maintenance: Pap:  04/06/22 neg: HR HPV neg History of abnormal Pap:  yes, Hx of cryotherapy years ago and states she had hysterectomy due to pre-cancerous cells on cervix  MMG:  03/20/22 Breast Density Cat B, BI-RADS CAT 1 neg Colonoscopy:  01/22/17 BMD:   n/a  Result  n/a TDaP:  09/06/17 Gardasil:   no HIV: n/a Hep C: n/a Screening Labs:  Hb today: ***, Urine today: ***   reports that she quit smoking about 8 years ago. Her smoking use included cigarettes. She started smoking about 9 years ago. She has a 10 pack-year smoking history. She has never used smokeless tobacco. She reports that she does not drink alcohol and does not use drugs.  Past Medical History:  Diagnosis Date   Arthritis    Basal cell carcinoma 06/26/2021   left shoulder, top of deltoid - [facility] 09/15/2021   Basal cell carcinoma 08/25/2021   Left distal ant thigh - [facility]   Basal cell carcinoma 08/25/2021   Left upper back paraspinal - [facility]   Basal cell carcinoma 08/25/2021   Right infrascapular - [facility]   Basal cell carcinoma 08/25/2021   Right mid back braline -[facility]   Chicken pox    Depression    Dysplastic nevus 05/05/2022   LLQA, severe atypia, margins free but close to margin, recheck in 3 months   GERD (gastroesophageal reflux disease)    High blood pressure    High cholesterol    History of gastroesophageal reflux (GERD)    History of headache    History of migraine    History of UTI    Melanoma in situ (HCC) 11/24/2021   Left post shoulder - Excised 02/10/2022   Polyp of colon    PONV (postoperative nausea and vomiting)     Past Surgical History:  Procedure Laterality Date   BREAST REDUCTION SURGERY   2000   CESAREAN SECTION  1990   COLONOSCOPY WITH PROPOFOL N/A 01/22/2017   Procedure: COLONOSCOPY WITH PROPOFOL;  Surgeon: Midge Minium, MD;  Location: [facility];  Service: Endoscopy;  Laterality: N/A;   PARTIAL HYSTERECTOMY  1997   POLYPECTOMY  01/22/2017   Procedure: POLYPECTOMY;  Surgeon: Midge Minium, MD;  Location[facility];  Service: Endoscopy;;   REDUCTION MAMMAPLASTY Bilateral 2000   BILAT   XI ROBOTIC LAPAROSCOPIC ASSISTED APPENDECTOMY N/A 02/21/2021   Procedure: XI ROBOTIC LAPAROSCOPIC ASSISTED APPENDECTOMY;  Surgeon: Carolan Shiver, MD;  Location: [facility];  Service: General;  Laterality: N/A;    Current Outpatient Medications  Medication Sig Dispense Refill   albuterol (VENTOLIN HFA) 108 (90 Base) MCG/ACT inhaler Inhale 2 puffs into the lungs every 4 (four) hours as needed for wheezing or shortness of breath (cough, shortness of breath or wheezing.). 6.7 g 1   ascorbic acid (VITAMIN C) 500 MG tablet Take 500 mg by mouth daily.     aspirin EC 81 MG tablet Take 81 mg by mouth daily.     clobetasol cream (TEMOVATE) 0.05 % Apply 1 Application topically daily. Of affected areas PRN. Avoid face, groin and underarms. 30 g 0   clobetasol cream (TEMOVATE)  0.05 % Apply 1 Application topically 2 (two) times daily. Apply to hands and feet 3 days a week for eczema, prn flares, avoid face, groin, axilla 45 g 3   fluticasone (FLONASE) 50 MCG/ACT nasal spray Place 2 sprays into both nostrils daily. 16 g 6   gabapentin (NEURONTIN) 100 MG capsule Take two capsules (200 mg) at night. 180 capsule 1   hydrochlorothiazide (MICROZIDE) 12.5 MG capsule TAKE 1 CAPSULE BY MOUTH EVERY DAY 90 capsule 2   ketoconazole (NIZORAL) 2 % cream Apply 1 Application topically 2 (two) times daily. 15 g 0   MAGNESIUM PO Take by mouth.     Multiple Vitamin (MULTIVITAMIN WITH MINERALS) TABS tablet Take 1 tablet by mouth daily.     mupirocin ointment (BACTROBAN) 2 % Apply to skin qd-bid 22 g 0    omeprazole (PRILOSEC) 20 MG capsule Take 1 capsule (20 mg total) by mouth 2 (two) times daily before a meal. 90 capsule 1   OVER THE COUNTER MEDICATION Muscle cramp formula , juice plus Omega blend.     phentermine 37.5 MG capsule Take 1 capsule (37.5 mg total) by mouth every morning. 30 capsule 0   rosuvastatin (CRESTOR) 5 MG tablet TAKE 1 TABLET (5 MG TOTAL) BY MOUTH DAILY. 30 tablet 5   Ruxolitinib Phosphate (OPZELURA) 1.5 % CREA Apply 1 Application topically as directed. Qd to bid aa hands and feet, prn eczema flares 60 g 6   Tralokinumab-ldrm ([name]) 150 MG/ML SOSY Inject 2 mLs (300 mg total) into the skin every 14 (fourteen) days. Starting on day 15 for maintenance. 4 mL 0   tretinoin (RETIN-A) 0.025 % cream Apply topically at bedtime. 45 g 2   vitamin B-12 (CYANOCOBALAMIN) 1000 MCG tablet Take 1,000 mcg by mouth daily.     No current facility-administered medications for this visit.    Family History  Problem Relation Age of Onset   Arthritis Mother    Peripheral Artery Disease Mother    Arthritis Father    Hyperlipidemia Father    Stroke Father    High blood pressure Father    Diabetes Father    Protein S deficiency Father    Protein S deficiency Sister    Hodgkin's lymphoma Maternal Uncle    Early death Maternal Uncle 49   Cancer Maternal Grandmother        intestinal   Arthritis Maternal Grandmother    Stroke Maternal Grandmother    Birth defects Maternal Grandmother    High Cholesterol Maternal Grandmother    Arthritis Maternal Grandfather    Cancer Maternal Grandfather    Early death Maternal Grandfather    Emphysema Maternal Grandfather    Cancer Paternal Grandmother    Alcohol abuse Paternal Grandmother    Heart attack Paternal Grandmother    Breast cancer Neg Hx     Review of Systems  Exam:   There were no vitals taken for this visit.    General appearance: alert, cooperative and appears stated age Head: normocephalic, without obvious abnormality,  atraumatic Neck: no adenopathy, supple, symmetrical, trachea midline and thyroid normal to inspection and palpation Lungs: clear to auscultation bilaterally Breasts: normal appearance, no masses or tenderness, No nipple retraction or dimpling, No nipple discharge or bleeding, No axillary adenopathy Heart: regular rate and rhythm Abdomen: soft, non-tender; no masses, no organomegaly Extremities: extremities normal, atraumatic, no cyanosis or edema Skin: skin color, texture, turgor normal. No rashes or lesions Lymph nodes: cervical, supraclavicular, and axillary nodes normal. Neurologic: grossly normal  Pelvic: External genitalia:  no lesions              No abnormal inguinal nodes palpated.              Urethra:  normal appearing urethra with no masses, tenderness or lesions              Bartholins and Skenes: normal                 Vagina: normal appearing vagina with normal color and discharge, no lesions              Cervix: no lesions              Pap taken: {yes no:314532} Bimanual Exam:  Uterus:  normal size, contour, position, consistency, mobility, non-tender              Adnexa: no mass, fullness, tenderness              Rectal exam: {yes no:314532}.  Confirms.              Anus:  normal sphincter tone, no lesions  Chaperone was present for exam:  ***  Assessment:   Well woman visit with gynecologic exam.   Plan: Mammogram screening discussed. Self breast awareness reviewed. Pap and HR HPV as above. Guidelines for Calcium, Vitamin D, regular exercise program including cardiovascular and weight bearing exercise.   Follow up annually and prn.   Additional counseling given.  {yes [id number]. _______ minutes face to face time of which over 50% was spent in counseling.    After visit summary provided.

## 2023-04-07 ENCOUNTER — Other Ambulatory Visit: Payer: Self-pay | Admitting: Nurse Practitioner

## 2023-04-07 DIAGNOSIS — E7800 Pure hypercholesterolemia, unspecified: Secondary | ICD-10-CM

## 2023-04-12 ENCOUNTER — Other Ambulatory Visit: Payer: Self-pay | Admitting: Obstetrics and Gynecology

## 2023-04-12 DIAGNOSIS — N951 Menopausal and female climacteric states: Secondary | ICD-10-CM

## 2023-04-13 ENCOUNTER — Encounter: Payer: Self-pay | Admitting: Nurse Practitioner

## 2023-04-13 NOTE — Telephone Encounter (Signed)
Med refill request: gabapentin 100mg   Last AEX: 04/06/22 Next AEX: 04/14/23 Last MMG (if hormonal med) n/a Refill sent to provider for approval.

## 2023-04-14 ENCOUNTER — Ambulatory Visit: Payer: 59 | Admitting: Obstetrics and Gynecology

## 2023-04-21 ENCOUNTER — Other Ambulatory Visit: Payer: Self-pay | Admitting: Nurse Practitioner

## 2023-04-21 DIAGNOSIS — K2900 Acute gastritis without bleeding: Secondary | ICD-10-CM

## 2023-04-22 ENCOUNTER — Other Ambulatory Visit: Payer: Self-pay | Admitting: Nurse Practitioner

## 2023-04-22 DIAGNOSIS — Z1231 Encounter for screening mammogram for malignant neoplasm of breast: Secondary | ICD-10-CM

## 2023-04-27 ENCOUNTER — Ambulatory Visit
Admission: RE | Admit: 2023-04-27 | Discharge: 2023-04-27 | Disposition: A | Discharge status: Home / Self Care | Payer: 59 | Source: Ambulatory Visit | Attending: Nurse Practitioner | Admitting: Nurse Practitioner

## 2023-04-27 DIAGNOSIS — Z1231 Encounter for screening mammogram for malignant neoplasm of breast: Secondary | ICD-10-CM | POA: Diagnosis not present

## 2023-04-28 ENCOUNTER — Encounter: Payer: Self-pay | Admitting: Nurse Practitioner

## 2023-04-28 ENCOUNTER — Ambulatory Visit (INDEPENDENT_AMBULATORY_CARE_PROVIDER_SITE_OTHER): Payer: 59 | Admitting: Nurse Practitioner

## 2023-04-28 VITALS — BP 132/86 | HR 76 | Temp 97.8°F | Ht 65.0 in | Wt 162.4 lb

## 2023-04-28 DIAGNOSIS — R7303 Prediabetes: Secondary | ICD-10-CM

## 2023-04-28 DIAGNOSIS — N951 Menopausal and female climacteric states: Secondary | ICD-10-CM

## 2023-04-28 DIAGNOSIS — K219 Gastro-esophageal reflux disease without esophagitis: Secondary | ICD-10-CM

## 2023-04-28 DIAGNOSIS — Z1159 Encounter for screening for other viral diseases: Secondary | ICD-10-CM | POA: Diagnosis not present

## 2023-04-28 DIAGNOSIS — Z0000 Encounter for general adult medical examination without abnormal findings: Secondary | ICD-10-CM

## 2023-04-28 DIAGNOSIS — E785 Hyperlipidemia, unspecified: Secondary | ICD-10-CM | POA: Diagnosis not present

## 2023-04-28 DIAGNOSIS — E559 Vitamin D deficiency, unspecified: Secondary | ICD-10-CM

## 2023-04-28 DIAGNOSIS — E663 Overweight: Secondary | ICD-10-CM

## 2023-04-28 DIAGNOSIS — Z114 Encounter for screening for human immunodeficiency virus [HIV]: Secondary | ICD-10-CM

## 2023-04-28 DIAGNOSIS — Z87891 Personal history of nicotine dependence: Secondary | ICD-10-CM | POA: Diagnosis not present

## 2023-04-28 DIAGNOSIS — I10 Essential (primary) hypertension: Secondary | ICD-10-CM | POA: Diagnosis not present

## 2023-04-28 LAB — CBC
HCT: 43.1 % (ref 36.0–46.0)
Hemoglobin: 14.1 g/dL (ref 12.0–15.0)
MCHC: 32.8 g/dL (ref 30.0–36.0)
MCV: 93.4 fL (ref 78.0–100.0)
Platelets: 337 10*3/uL (ref 150.0–400.0)
RBC: 4.62 Mil/uL (ref 3.87–5.11)
RDW: 13.1 % (ref 11.5–15.5)
WBC: 5.7 10*3/uL (ref 4.0–10.5)

## 2023-04-28 LAB — URINALYSIS, MICROSCOPIC ONLY: WBC, UA: NONE SEEN — AB (ref 0–?)

## 2023-04-28 LAB — COMPREHENSIVE METABOLIC PANEL
ALT: 31 U/L (ref 0–35)
AST: 27 U/L (ref 0–37)
Albumin: 4.5 g/dL (ref 3.5–5.2)
Alkaline Phosphatase: 79 U/L (ref 39–117)
BUN: 15 mg/dL (ref 6–23)
CO2: 29 meq/L (ref 19–32)
Calcium: 10.1 mg/dL (ref 8.4–10.5)
Chloride: 102 meq/L (ref 96–112)
Creatinine, Ser: 0.72 mg/dL (ref 0.40–1.20)
GFR: 92.31 mL/min (ref >60.00)
Glucose, Bld: 88 mg/dL (ref 70–99)
Potassium: 4.3 meq/L (ref 3.5–5.1)
Sodium: 140 meq/L (ref 135–145)
Total Bilirubin: 0.4 mg/dL (ref 0.2–1.2)
Total Protein: 7 g/dL (ref 6.0–8.3)

## 2023-04-28 LAB — LIPID PANEL
Cholesterol: 165 mg/dL (ref 0–200)
HDL: 46.1 mg/dL (ref >39.00)
LDL Cholesterol: 97 mg/dL (ref 0–99)
NonHDL: 118.9
Total CHOL/HDL Ratio: 4
Triglycerides: 111 mg/dL (ref 0.0–149.0)
VLDL: 22.2 mg/dL (ref 0.0–40.0)

## 2023-04-28 LAB — TSH: TSH: 1.97 u[IU]/mL (ref 0.35–5.50)

## 2023-04-28 LAB — HEMOGLOBIN A1C: Hgb A1c MFr Bld: 5.9 % (ref 4.6–6.5)

## 2023-04-28 LAB — VITAMIN D 25 HYDROXY (VIT D DEFICIENCY, FRACTURES): VITD: 25.13 ng/mL — ABNORMAL LOW (ref 30.00–100.00)

## 2023-04-28 MED ORDER — PHENTERMINE HCL 37.5 MG PO CAPS
37.5000 mg | ORAL_CAPSULE | ORAL | 0 refills | Status: DC
Start: 2023-04-28 — End: 2023-05-26

## 2023-04-28 MED ORDER — PANTOPRAZOLE SODIUM 20 MG PO TBEC
20.0000 mg | DELAYED_RELEASE_TABLET | Freq: Every day | ORAL | 0 refills | Status: DC
Start: 2023-04-28 — End: 2023-07-28

## 2023-04-28 NOTE — Assessment & Plan Note (Signed)
History of same.  Patient lost some weight but gained it back start back on phentermine 37.5 mg daily.  Follow-up 1 month.  Continue work on healthy lifestyle modifications.

## 2023-04-28 NOTE — Patient Instructions (Signed)
Nice to see you today I will be in touch with the labs once I have them Follow up with me in 1 month for a weight recheck

## 2023-04-28 NOTE — Assessment & Plan Note (Signed)
History of same.  Pending A1c

## 2023-04-28 NOTE — Assessment & Plan Note (Signed)
Patient currently maintained on hydrochlorothiazide 12.5 mg daily.  Tolerating medication well.  Blood pressure controlled.  Continue medication as prescribed

## 2023-04-28 NOTE — Progress Notes (Signed)
Established Patient Office Visit  Subjective   Patient ID: Judy Huang, female    DOB: 08-Feb-1966  Age: 57 y.o. MRN: [id number]  Chief Complaint  Patient presents with   Annual Exam    HPI   for complete physical and follow up of chronic conditions. HTN: States that she does check her blood pressure at home as needed. She is on hydrochlorothiazide   GERD: states that she has been using the omeprazole daily and does well. States that her insurance will not pay for it   HLD: On crestor daily.  Patient had discontinued the medication because she was doing so weight loss but started gaining weight back and started taking the medication again.  Derm:every 6 months for skin surverys. Hx of skin cancers. Topical treatmetns    Immunizations: -Tetanus: Completed in 2019 -Influenza: refused -Shingles: Refused -Pneumonia: Too young -covid: refused   Diet: Fair diet. States that she is eating 2 melas a day and a small something for breakfast. States that snacks are banana and almonds  Exercise: she will do wall pilates daily  Eye exam: Completes annually. Glasses   Dental exam: Completes semi-annually    Colonoscopy: Completed in 01/2017, repeat 10 2028 Lung Cancer Screening: NA   Pap smear: see obgyn yearly and is prescribed gabapentin for hot flashes.  History of hysterectomy  Mammogram: 04/27/2023  Sleep: states that she is going to bed around 10 and will get up around 430-6. States that she feels rested.  Does snore       Review of Systems  Constitutional:  Negative for chills and fever.  HENT:  Positive for congestion.   Respiratory:  Negative for shortness of breath.   Cardiovascular:  Negative for chest pain and leg swelling.  Gastrointestinal:  Negative for abdominal pain, blood in stool, constipation, diarrhea, nausea and vomiting.       Bm daily   Genitourinary:  Negative for dysuria and hematuria.  Neurological:  Positive for tingling (bilateral hand that  is intermittent). Negative for headaches.  Psychiatric/Behavioral:  Negative for hallucinations and suicidal ideas.       Objective:     BP 132/86   Pulse 76   Temp 97.8 F (36.6 C) (Oral)   Ht 5\' 5"  (1.651 m)   Wt 162 lb 6.4 oz (73.7 kg)   SpO2 94%   BMI 27.02 kg/m  BP Readings from Last 3 Encounters:  04/28/23 132/86  02/18/23 (!) 143/86  12/23/22 118/82   Wt Readings from Last 3 Encounters:  04/28/23 162 lb 6.4 oz (73.7 kg)  12/23/22 157 lb 4 oz (71.3 kg)  11/27/22 162 lb 2 oz (73.5 kg)      Physical Exam Vitals and nursing note reviewed.  Constitutional:      Appearance: Normal appearance.  HENT:     Right Ear: Tympanic membrane, ear canal and external ear normal.     Left Ear: Tympanic membrane, ear canal and external ear normal.     Mouth/Throat:     Mouth: Mucous membranes are moist.     Pharynx: Oropharynx is clear.  Eyes:     Extraocular Movements: Extraocular movements intact.     Pupils: Pupils are equal, round, and reactive to light.  Cardiovascular:     Rate and Rhythm: Normal rate and regular rhythm.     Pulses: Normal pulses.     Heart sounds: Normal heart sounds.  Pulmonary:     Effort: Pulmonary effort is normal.  Breath sounds: Normal breath sounds.  Abdominal:     General: Bowel sounds are normal. There is no distension.     Palpations: There is no mass.     Tenderness: There is no abdominal tenderness.     Hernia: No hernia is present.  Musculoskeletal:     Right lower leg: No edema.     Left lower leg: No edema.  Lymphadenopathy:     Cervical: No cervical adenopathy.  Skin:    General: Skin is warm.  Neurological:     General: No focal deficit present.     Mental Status: She is alert.     Deep Tendon Reflexes:     Reflex Scores:      Bicep reflexes are 2+ on the right side and 2+ on the left side.      Patellar reflexes are 2+ on the right side and 2+ on the left side.    Comments: Bilateral upper and lower extremity strength  5/5  Psychiatric:        Mood and Affect: Mood normal.        Behavior: Behavior normal.        Thought Content: Thought content normal.        Judgment: Judgment normal.      No results found for any visits on 04/28/23.    The 10-year ASCVD risk score (Arnett DK, et al., 2019) is: 8.6%    Assessment & Plan:   Problem List Items Addressed This Visit       Cardiovascular and Mediastinum   Essential hypertension    Patient currently maintained on hydrochlorothiazide 12.5 mg daily.  Tolerating medication well.  Blood pressure controlled.  Continue medication as prescribed        Digestive   Gastroesophageal reflux disease    History of the same.  Patient states she is using omeprazole daily but insurance not covering it.  Will switch her to Protonix 20 mg daily.  She was informed to take 1 or the other.  Not both agents      Relevant Medications   pantoprazole (PROTONIX) 20 MG tablet     Other   Vasomotor symptoms due to menopause    Patient currently followed by gynecology and on gabapentin at night.  Continue following with specialist as recommended continuing gabapentin as prescribed      HLD (hyperlipidemia)    Patient currently maintained on rosuvastatin 5 mg daily.  Pending lipid panel today.  Continue medication as prescribed      Relevant Orders   Lipid panel   Vitamin D deficiency    History of the same pending vitamin D level today.      Relevant Orders   VITAMIN D 25 Hydroxy (Vit-D Deficiency, Fractures)   Prediabetes    History of same.  Pending A1c      Relevant Orders   Hemoglobin A1c   Overweight    History of same.  Patient lost some weight but gained it back start back on phentermine 37.5 mg daily.  Follow-up 1 month.  Continue work on healthy lifestyle modifications.      Relevant Medications   phentermine 37.5 MG capsule   Preventative health care - Primary    Discussed age-appropriate immunizations and screening exams.  Did review  patient's personal, surgical, social, family histories.  Patient is up-to-date on all age-appropriate vaccinations that she would like.  Patient is up-to-date on CRC screening and breast cancer screening.  She is followed by GYN  but no longer has a cervix so no longer participates in cervical cancer screening.  Patient was given information at discharge about preventative healthcare maintenance with anticipatory guidance.      Relevant Orders   CBC   Comprehensive metabolic panel   TSH   Former smoker    History of smoking.  Check urine microscopy for microscopic hematuria.      Relevant Orders   Urine Microscopic   Other Visit Diagnoses     Encounter for hepatitis C screening test for low risk patient       Relevant Orders   Hepatitis C Antibody   Encounter for screening for HIV       Relevant Orders   HIV antibody (with reflex)       Return in about 4 weeks (around 05/26/2023) for weight/medication recheck.    Audria Nine, NP

## 2023-04-28 NOTE — Assessment & Plan Note (Signed)
History of smoking.  Check urine microscopy for microscopic hematuria.

## 2023-04-28 NOTE — Assessment & Plan Note (Signed)
Patient currently followed by gynecology and on gabapentin at night.  Continue following with specialist as recommended continuing gabapentin as prescribed

## 2023-04-28 NOTE — Assessment & Plan Note (Signed)
Discussed age-appropriate immunizations and screening exams.  Did review patient's personal, surgical, social, family histories.  Patient is up-to-date on all age-appropriate vaccinations that she would like.  Patient is up-to-date on CRC screening and breast cancer screening.  She is followed by GYN but no longer has a cervix so no longer participates in cervical cancer screening.  Patient was given information at discharge about preventative healthcare maintenance with anticipatory guidance.

## 2023-04-28 NOTE — Assessment & Plan Note (Signed)
History of the same.  Patient states she is using omeprazole daily but insurance not covering it.  Will switch her to Protonix 20 mg daily.  She was informed to take 1 or the other.  Not both agents

## 2023-04-28 NOTE — Assessment & Plan Note (Signed)
Patient currently maintained on rosuvastatin 5 mg daily.  Pending lipid panel today.  Continue medication as prescribed

## 2023-04-28 NOTE — Assessment & Plan Note (Signed)
History of the same pending vitamin D level today 

## 2023-04-29 LAB — HEPATITIS C ANTIBODY: Hepatitis C Ab: NONREACTIVE

## 2023-04-29 LAB — HIV ANTIBODY (ROUTINE TESTING W REFLEX): HIV 1&2 Ab, 4th Generation: NONREACTIVE

## 2023-04-30 ENCOUNTER — Other Ambulatory Visit: Payer: Self-pay | Admitting: Nurse Practitioner

## 2023-04-30 MED ORDER — AZITHROMYCIN 250 MG PO TABS: ORAL_TABLET | ORAL | 0 refills | Status: AC

## 2023-04-30 NOTE — Telephone Encounter (Signed)
[name] - please advise. Thanks.

## 2023-04-30 NOTE — Progress Notes (Signed)
Orders only

## 2023-05-03 ENCOUNTER — Other Ambulatory Visit: Payer: Self-pay | Admitting: Nurse Practitioner

## 2023-05-03 DIAGNOSIS — H938X3 Other specified disorders of ear, bilateral: Secondary | ICD-10-CM

## 2023-05-03 NOTE — Telephone Encounter (Signed)
Noted, sorry this was sent after I was already out of the office. Matt did respond, noted.

## 2023-05-16 ENCOUNTER — Other Ambulatory Visit: Payer: Self-pay | Admitting: Nurse Practitioner

## 2023-05-16 ENCOUNTER — Other Ambulatory Visit: Payer: Self-pay | Admitting: Obstetrics and Gynecology

## 2023-05-16 DIAGNOSIS — N951 Menopausal and female climacteric states: Secondary | ICD-10-CM

## 2023-05-16 DIAGNOSIS — I10 Essential (primary) hypertension: Secondary | ICD-10-CM

## 2023-05-17 NOTE — Telephone Encounter (Signed)
Med refill request: Gabapentin Last AEX: 04/06/2022-BS Next AEX: none Last MMG (if hormonal med): n/a Refill authorized: rx pend.  Pt was scheduled for AEX on 04/14/2023 but appt was cancelled by pt and has not yet r/s. Will send msg to appt desk to contact to see if pt wants to r/s.

## 2023-05-17 NOTE — Telephone Encounter (Signed)
Judy Huang, CMA Left message for patient to call and schedule appointment.

## 2023-05-19 NOTE — Telephone Encounter (Signed)
Judy Huang started note 10/28. Re-routing for f/u   Med refill request: Gabapentin Last AEX: 04/06/2022-BS Next AEX: none Last MMG (if hormonal med): n/a Refill authorized: rx pend.   Pt was scheduled for AEX on 04/14/2023 but appt was cancelled by pt and has not yet r/s. Will send msg to appt desk to contact to see if pt wants to r/s.

## 2023-05-25 NOTE — Telephone Encounter (Signed)
FYI.  Arlyss Repress, Kalman Shan, CMA Left message for patient to call and reschedule appointment; patient has not called back."

## 2023-05-26 ENCOUNTER — Encounter: Payer: Self-pay | Admitting: Nurse Practitioner

## 2023-05-26 ENCOUNTER — Ambulatory Visit (INDEPENDENT_AMBULATORY_CARE_PROVIDER_SITE_OTHER): Payer: 59 | Admitting: Nurse Practitioner

## 2023-05-26 VITALS — BP 132/88 | HR 99 | Temp 98.3°F | Ht 65.0 in | Wt 156.8 lb

## 2023-05-26 DIAGNOSIS — Z6826 Body mass index (BMI) 26.0-26.9, adult: Secondary | ICD-10-CM | POA: Diagnosis not present

## 2023-05-26 DIAGNOSIS — E663 Overweight: Secondary | ICD-10-CM

## 2023-05-26 MED ORDER — PHENTERMINE HCL 37.5 MG PO CAPS: 37.5000 mg | ORAL_CAPSULE | ORAL | 0 refills | Status: DC

## 2023-05-26 NOTE — Patient Instructions (Signed)
Nice to see you today I will continue the phentermine Get an over the counter antihistamine like Claritin, zyrtec, xyzal to see if that helps with the cough

## 2023-05-26 NOTE — Progress Notes (Signed)
Established Patient Office Visit  Subjective   Patient ID: Judy Huang, female    DOB: [birth year]  Age: 57 y.o. MRN: [id number]  Chief Complaint  Patient presents with   Medication Management    Pt complains of doing fine being back on phetermine. Pt complains that the medication worked a lot better last time. States she is not losing as much weight.     HPI  Overweight: Patient seen in the past started on phentermine on 10/29/2022.  Patient started on 50 mg titrated up to 37 and half milligrams was maintained on 37 and half milligrams may in June 2024.  Last visit patient lost approximately 10 pounds.  Likely lost more than that as we did have a follow-up since we are discontinuing the medication after.  Patient was then seen by me on 04/28/2023.  Back in May patient was 152 pounds and at her physical in October she is back up to 162 pounds.  She is here for follow-up  States things have been hectic lately. States that her goal is smaller breakfast lunch and a regular dinner. States now she is doing a very small breakfast, sncak for lunch, then dinner. Water all day  States that she has not been exercising lately. States that she has been walking a lot for work.    Patient was coughing in office and started talking about the cough.  Did offer to fully evaluate this since her symptoms elevated couple days discussed doing COVID testing and all that.  Patient states that she would defer I did inform her that if she needed to be evaluated for this cough she had to come back in the office not just MyChart.   Review of Systems  Constitutional:  Negative for chills and fever.  Respiratory:  Negative for shortness of breath.   Cardiovascular:  Negative for chest pain and palpitations.  Gastrointestinal:  Negative for constipation.  Neurological:  Negative for dizziness and headaches.      Objective:     BP 132/88   Pulse 99   Temp 98.3 F (36.8 C) (Oral)   Ht 5\' 5"  (1.651 m)    Wt 156 lb 12.8 oz (71.1 kg)   SpO2 96%   BMI 26.09 kg/m  BP Readings from Last 3 Encounters:  05/26/23 132/88  04/28/23 132/86  02/18/23 (!) 143/86   Wt Readings from Last 3 Encounters:  05/26/23 156 lb 12.8 oz (71.1 kg)  04/28/23 162 lb 6.4 oz (73.7 kg)  12/23/22 157 lb 4 oz (71.3 kg)   SpO2 Readings from Last 3 Encounters:  05/26/23 96%  04/28/23 94%  12/23/22 96%      Physical Exam Vitals and nursing note reviewed.  Constitutional:      Appearance: Normal appearance.  Cardiovascular:     Rate and Rhythm: Normal rate and regular rhythm.     Heart sounds: Normal heart sounds.  Pulmonary:     Effort: Pulmonary effort is normal.     Breath sounds: Normal breath sounds.  Abdominal:     General: Bowel sounds are normal.  Neurological:     Mental Status: She is alert.      No results found for any visits on 05/26/23.    The 10-year ASCVD risk score (Arnett DK, et al., 2019) is: 8.2%    Assessment & Plan:   Problem List Items Addressed This Visit       Other   Overweight - Primary    Patient currently  maintained on phentermine 37.5 mg daily.  Has had appreciable weight loss of approximately 6 pounds.  Continue work on lifestyle modifications.  Refill provided today      Relevant Medications   phentermine 37.5 MG capsule    Return in about 4 weeks (around 06/23/2023) for weight recheck/ med recheck .    Audria Nine, NP

## 2023-05-26 NOTE — Assessment & Plan Note (Signed)
Patient currently maintained on phentermine 37.5 mg daily.  Has had appreciable weight loss of approximately 6 pounds.  Continue work on lifestyle modifications.  Refill provided today

## 2023-05-29 IMAGING — DX DG CHEST 2V
2 series · 2 of 2 positions shown · non-contrast
Comparison: 01/22/2021

CLINICAL DATA: 55-year-old female with increased cough and abnormal
lung sounds

EXAM:
CHEST - 2 VIEW

[chest pa]
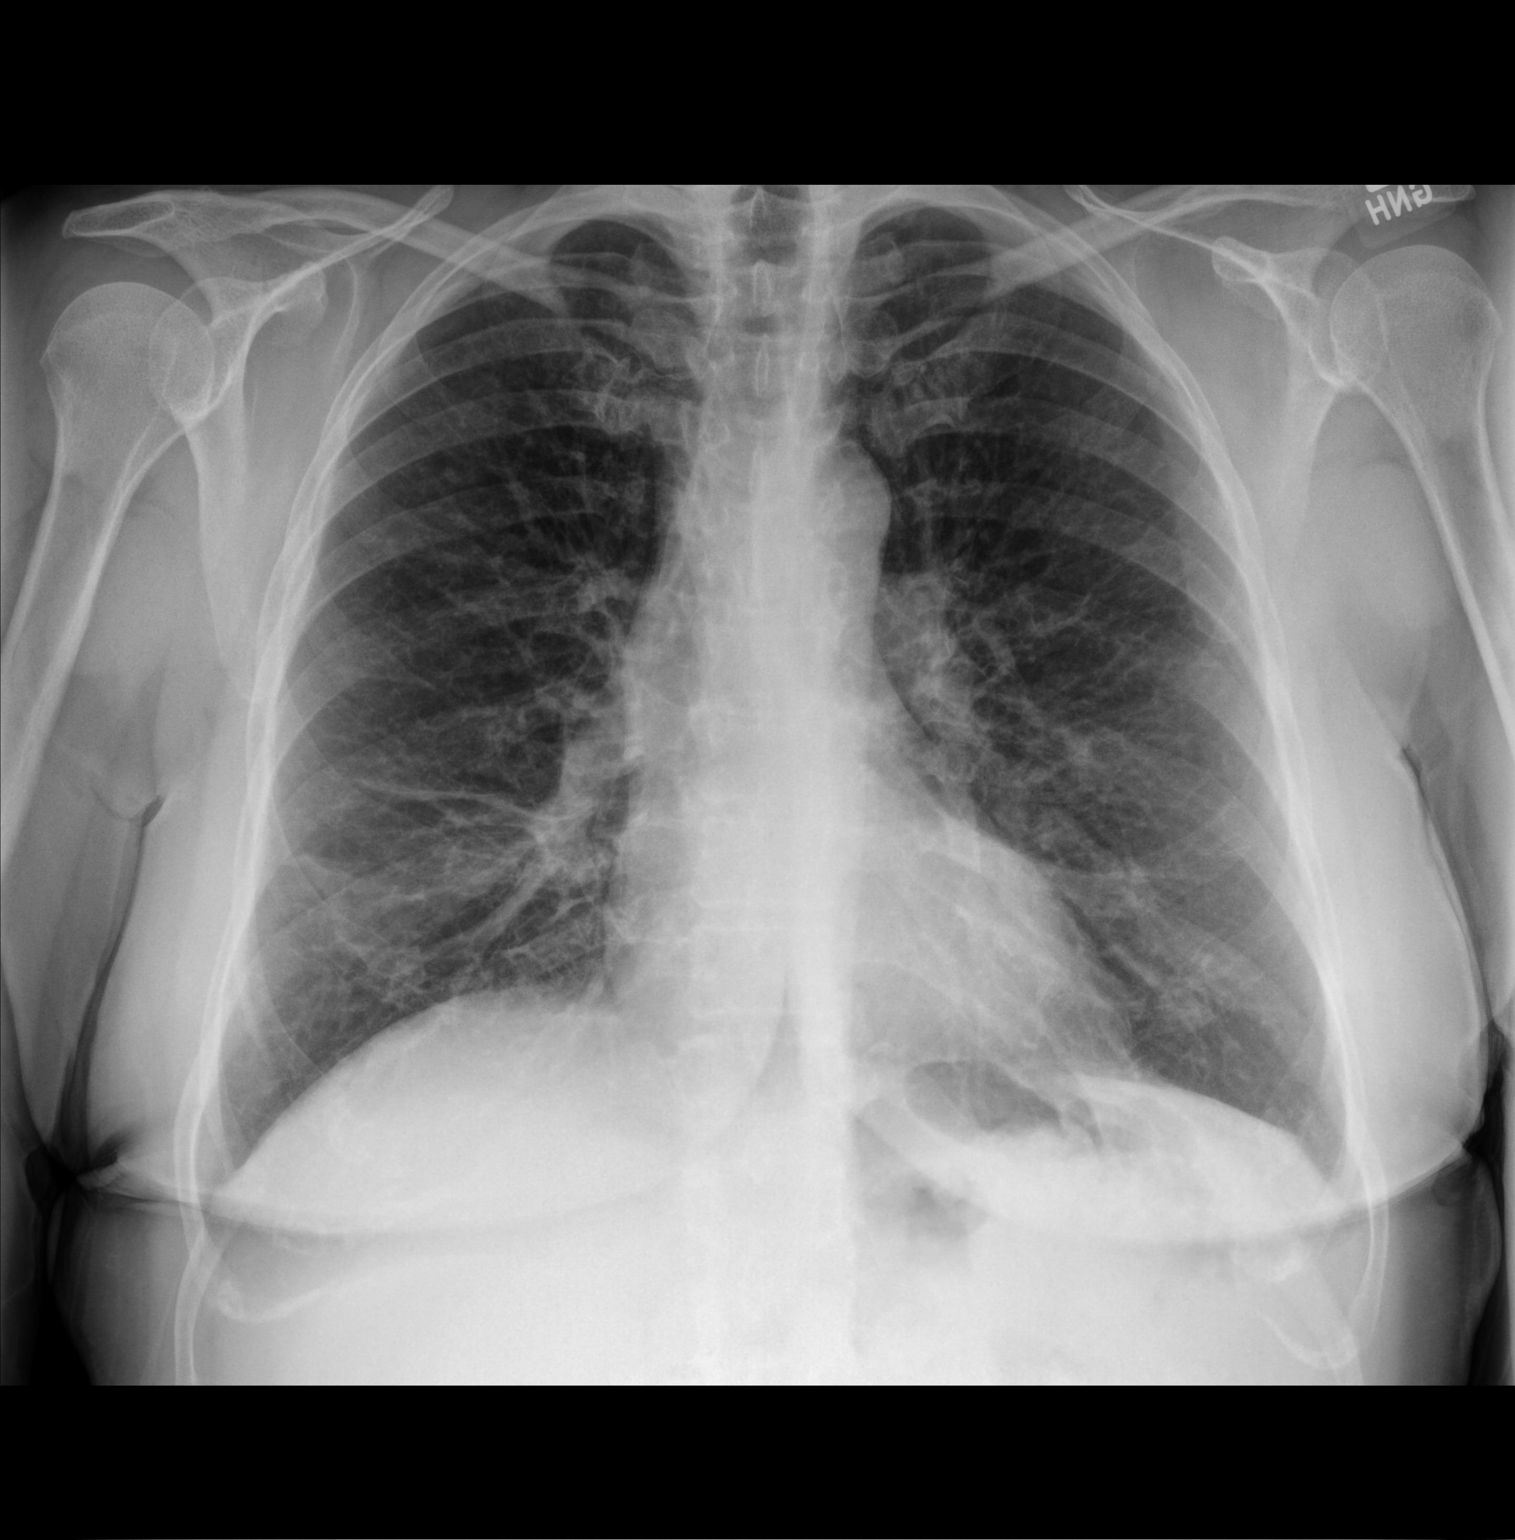

[chest lat]
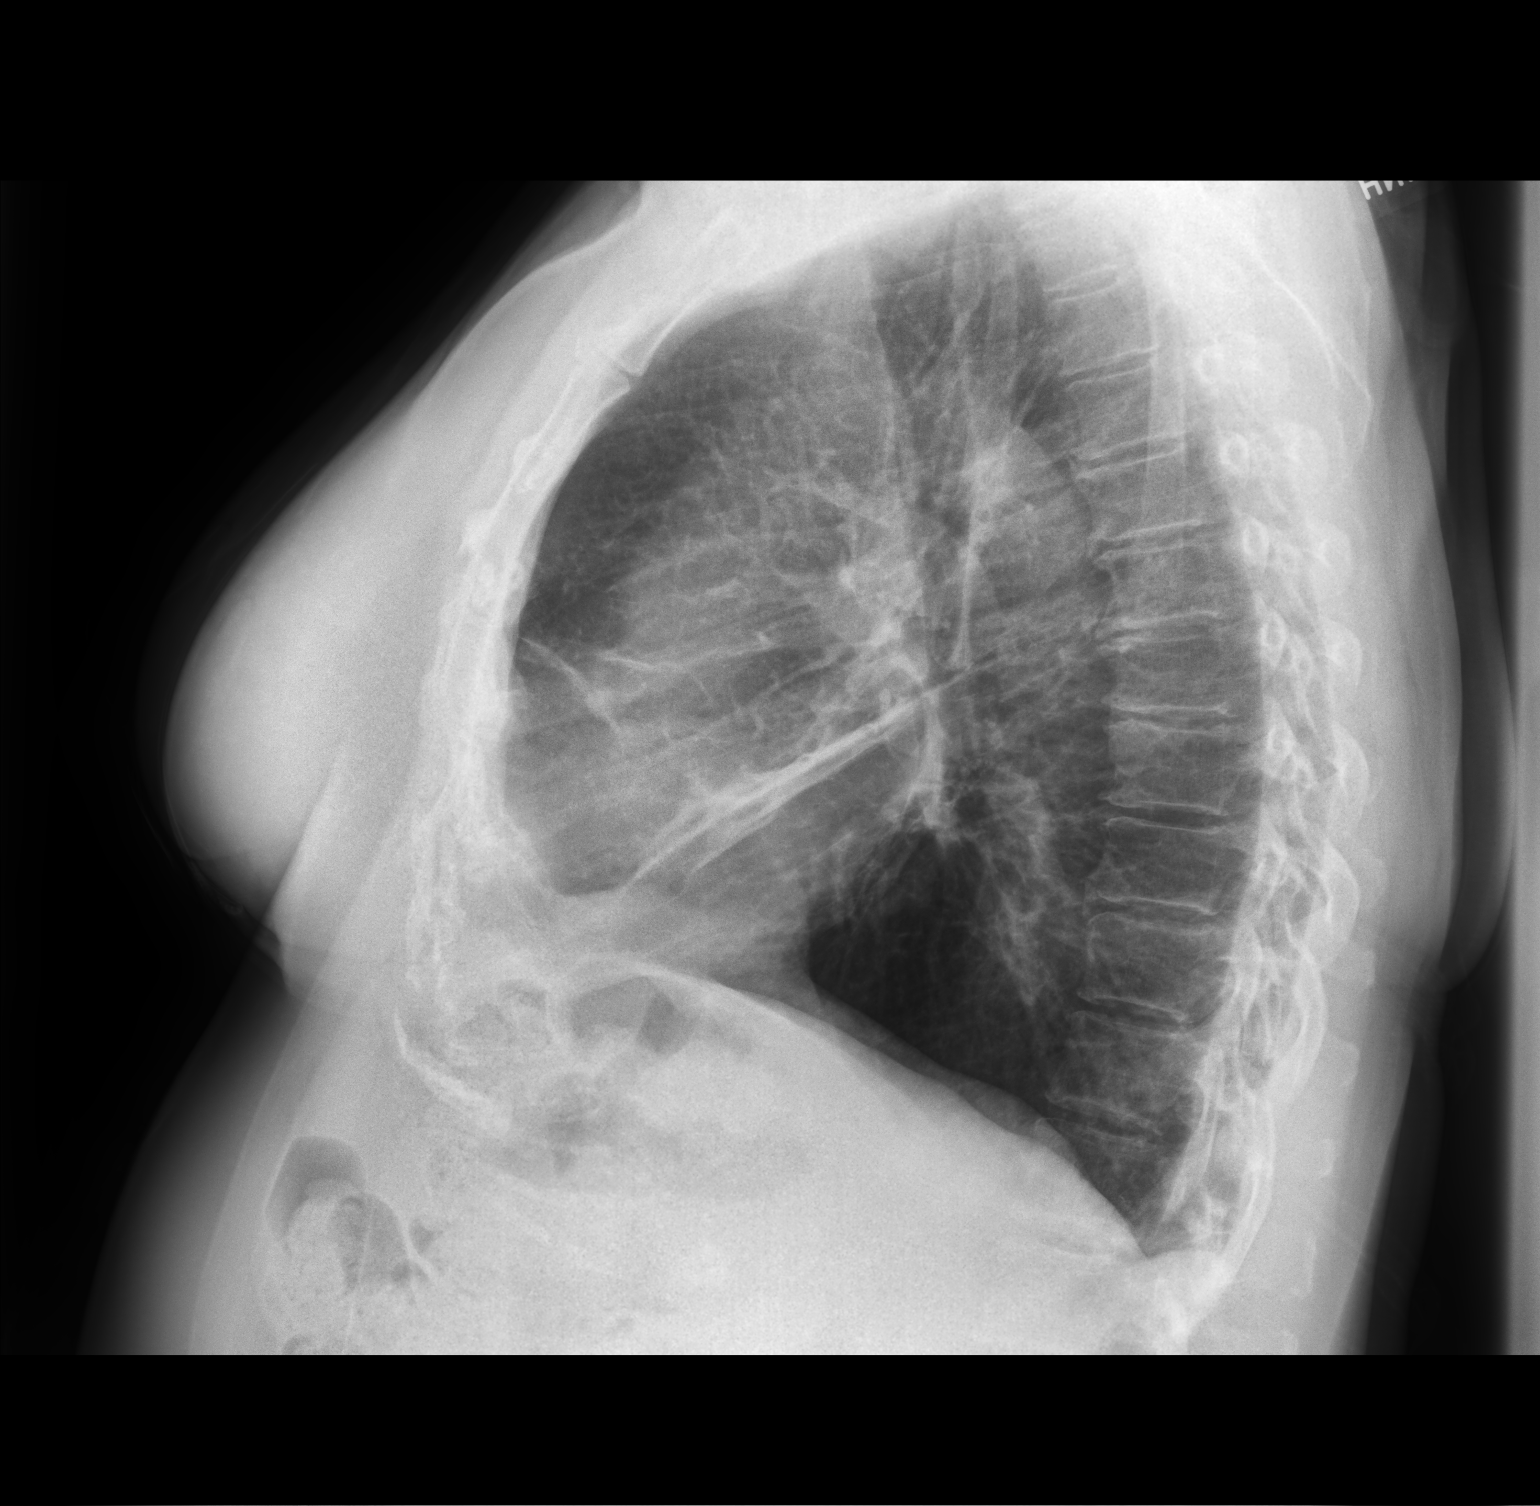

[2 of 2 positions shown; findings below may reference images not displayed]

FINDINGS: Cardiomediastinal silhouette unchanged in size and contour.

New reticulonodular opacities in the mid and lower lungs, with
thickening of the fissures on the lateral view. No pneumothorax or
pleural effusion.

Coarsened interstitial markings.

No displaced fracture.  Degenerative changes of the spine
IMPRESSION: New reticulonodular opacities in the lower lungs, concerning for
multifocal infection.

## 2023-06-25 ENCOUNTER — Ambulatory Visit (INDEPENDENT_AMBULATORY_CARE_PROVIDER_SITE_OTHER): Payer: 59 | Admitting: Nurse Practitioner

## 2023-06-25 ENCOUNTER — Encounter: Payer: Self-pay | Admitting: Nurse Practitioner

## 2023-06-25 VITALS — BP 118/88 | HR 90 | Temp 98.2°F | Ht 65.0 in | Wt 159.0 lb

## 2023-06-25 DIAGNOSIS — N951 Menopausal and female climacteric states: Secondary | ICD-10-CM

## 2023-06-25 DIAGNOSIS — E663 Overweight: Secondary | ICD-10-CM | POA: Diagnosis not present

## 2023-06-25 MED ORDER — GABAPENTIN 100 MG PO CAPS: ORAL_CAPSULE | ORAL | 0 refills | Status: DC

## 2023-06-25 MED ORDER — PHENTERMINE HCL 37.5 MG PO CAPS: 37.5000 mg | ORAL_CAPSULE | ORAL | 0 refills | Status: AC

## 2023-06-25 NOTE — Assessment & Plan Note (Signed)
Patient continue working lifestyle modifications.  Did discuss about eating small meals throughout the day versus 1 larger meal.  Continue phentermine 37.5 mg every morning follow-up 1 month

## 2023-06-25 NOTE — Progress Notes (Signed)
Established Patient Office Visit  Subjective   Patient ID: Judy Huang, female    DOB: 04-15-1966  Age: 57 y.o. MRN: [id number]  Chief Complaint  Patient presents with   Follow-up    Weight/medication recheck. Needs refill for phentermine     HPI   Obesity: patient was seen by me on 05/26/2023 after she had tried phenteramine earlier in the year and lost weight then subsequently gained weight back. She was started on phentermaine 37.5mg  and is here for a recheck. States that she is doing well on the medicaoitn. States that she feels like her appetitie is about the same. States that she did not eat much for the thanksgiving holiday. States that she has been eating dinner late at night. States that she will pack her lunch and have her fruit through the day. States that she does have a lot going on. Has to work and then come home and prepare food  She is trying to get her exercise    Review of Systems  Constitutional:  Negative for chills and fever.  Respiratory:  Negative for shortness of breath.   Cardiovascular:  Negative for chest pain and palpitations.  Gastrointestinal:  Negative for constipation.  Neurological:  Negative for dizziness and headaches.  Psychiatric/Behavioral:  The patient does not have insomnia.       Objective:     BP 118/88   Pulse 90   Temp 98.2 F (36.8 C) (Oral)   Ht 5\' 5"  (1.651 m)   Wt 159 lb (72.1 kg)   SpO2 99%   BMI 26.46 kg/m  BP Readings from Last 3 Encounters:  06/25/23 118/88  05/26/23 132/88  04/28/23 132/86   Wt Readings from Last 3 Encounters:  06/25/23 159 lb (72.1 kg)  05/26/23 156 lb 12.8 oz (71.1 kg)  04/28/23 162 lb 6.4 oz (73.7 kg)   SpO2 Readings from Last 3 Encounters:  06/25/23 99%  05/26/23 96%  04/28/23 94%      Physical Exam Vitals and nursing note reviewed.  Constitutional:      Appearance: Normal appearance.  Cardiovascular:     Rate and Rhythm: Normal rate and regular rhythm.     Heart sounds:  Normal heart sounds.  Pulmonary:     Effort: Pulmonary effort is normal.     Breath sounds: Normal breath sounds.  Neurological:     Mental Status: She is alert.      No results found for any visits on 06/25/23.    The 10-year ASCVD risk score (Arnett DK, et al., 2019) is: 6.6%    Assessment & Plan:   Problem List Items Addressed This Visit       Other   Menopausal symptoms    Patient has a history of the same.  Was written gabapentin 200 mg nightly by GYN.  Patient states MRI requesting a follow-up but she is having difficulty currently pain all her medical bills.  I will bridge patient for 90 days with the gabapentin with instructions to follow-up with GYN as I have seen her recently and had done lab work.      Relevant Medications   gabapentin (NEURONTIN) 100 MG capsule   Overweight - Primary    Patient continue working lifestyle modifications.  Did discuss about eating small meals throughout the day versus 1 larger meal.  Continue phentermine 37.5 mg every morning follow-up 1 month      Relevant Medications   phentermine 37.5 MG capsule    Return  in about 4 weeks (around 07/23/2023) for weight/med recheck .    Audria Nine, NP

## 2023-06-25 NOTE — Assessment & Plan Note (Signed)
Patient has a history of the same.  Was written gabapentin 200 mg nightly by GYN.  Patient states MRI requesting a follow-up but she is having difficulty currently pain all her medical bills.  I will bridge patient for 90 days with the gabapentin with instructions to follow-up with GYN as I have seen her recently and had done lab work.

## 2023-07-06 ENCOUNTER — Other Ambulatory Visit (HOSPITAL_COMMUNITY): Payer: Self-pay

## 2023-07-06 ENCOUNTER — Telehealth: Payer: Self-pay

## 2023-07-06 NOTE — Telephone Encounter (Signed)
Pharmacy Patient Advocate Encounter   Received notification from Patient Advice Request messages that prior authorization for Pantoprazole Sodium 20MG  dr tablets is required/requested.   Insurance verification completed.   The patient is insured through [facility] .   Per test claim: PA required and submitted KEY/EOC/Request #: [id number] APPROVED from 07/06/23 to 07/05/24. Ran test claim, Copay is $0. This test claim was processed through [facility]- copay amounts may vary at other pharmacies due to pharmacy/plan contracts, or as the patient moves through the different stages of their insurance plan.

## 2023-07-06 NOTE — Telephone Encounter (Deleted)
Pharmacy Patient Advocate Encounter   Received notification from Patient Advice Request messages that prior authorization for Pantoprazole Sodium 20MG  dr tablets is required/requested.   Insurance verification completed.   The patient is insured through [facility] .   Per test claim: PA required and submitted KEY/EOC/Request #: [id number] APPROVED from 07/06/23 to 07/05/24. Ran test claim, Copay is $0. This test claim was processed through [facility]- copay amounts may vary at other pharmacies due to pharmacy/plan contracts, or as the patient moves through the different stages of their insurance plan.

## 2023-07-06 NOTE — Telephone Encounter (Signed)
Informed pt of approved PA through mychart.  Pt is active on mychart.

## 2023-07-09 ENCOUNTER — Other Ambulatory Visit: Payer: Self-pay | Admitting: Dermatology

## 2023-07-27 ENCOUNTER — Ambulatory Visit (INDEPENDENT_AMBULATORY_CARE_PROVIDER_SITE_OTHER): Payer: 59 | Admitting: Nurse Practitioner

## 2023-07-27 VITALS — BP 124/82 | HR 79 | Temp 97.9°F | Ht 65.0 in | Wt 157.6 lb

## 2023-07-27 DIAGNOSIS — E663 Overweight: Secondary | ICD-10-CM

## 2023-07-27 NOTE — Progress Notes (Signed)
   Established Patient Office Visit  Subjective   Patient ID: Judy Huang, female    DOB: June 13, 1966  Age: 58 y.o. MRN: [id number]  Chief Complaint  Patient presents with   Follow-up    Weight and medication recheck. Pt states she is managing well on phetermine. States she feels like she has not lost that much weight over the holidays     HPI  Obesity: Patient was seen by me on 04/28/2023 for CPE and weight.  Patient again went back to doing phentermine  in the past we started phentermine  37.5 mg daily back.  Patient did have a follow-up on 05/26/2023 where she lost approximate 6 pounds.  Patient additional follow-up on 06/25/2023.  She is here today for recheck. States that she did ok over the past month. States that the holidays made it difficulty States that she is back to her routine. States that she is moving a lot with her jobs and doing healthy snacks. States cucumbers carrots almonds. She will have dinner at night.     Review of Systems  Constitutional:  Negative for chills and fever.  Respiratory:  Negative for shortness of breath.   Cardiovascular:  Negative for chest pain.  Gastrointestinal:  Positive for constipation.  Neurological:  Negative for headaches.      Objective:     BP 124/82   Pulse 79   Temp 97.9 F (36.6 C) (Oral)   Ht 5' 5 (1.651 m)   Wt 157 lb 9.6 oz (71.5 kg)   SpO2 98%   BMI 26.23 kg/m  BP Readings from Last 3 Encounters:  07/27/23 124/82  06/25/23 118/88  05/26/23 132/88   Wt Readings from Last 3 Encounters:  07/27/23 157 lb 9.6 oz (71.5 kg)  06/25/23 159 lb (72.1 kg)  05/26/23 156 lb 12.8 oz (71.1 kg)   SpO2 Readings from Last 3 Encounters:  07/27/23 98%  06/25/23 99%  05/26/23 96%      Physical Exam Vitals and nursing note reviewed.  Constitutional:      Appearance: Normal appearance.  Cardiovascular:     Rate and Rhythm: Normal rate and regular rhythm.     Heart sounds: Normal heart sounds.  Pulmonary:     Effort:  Pulmonary effort is normal.     Breath sounds: Normal breath sounds.  Abdominal:     General: Bowel sounds are normal.  Neurological:     Mental Status: She is alert.      No results found for any visits on 07/27/23.    The 10-year ASCVD risk score (Arnett DK, et al., 2019) is: 3.3%    Assessment & Plan:   Problem List Items Addressed This Visit       Other   Overweight (BMI 25.0-29.9) - Primary   Patient's BMI is under the threshold to treat with medicine for medical weight loss.  Patient has completed 3 months of phentermine  will take at least a 57-month break.  Can consider Qsymia in the future.  She will call us  if this is covered by her insurance.  Also can consider healthy weight wellness, if time allows.  Continue working on healthy lifestyle modifications inclusive of diet and exercise       Return if symptoms worsen or fail to improve.    Adina Crandall, NP

## 2023-07-27 NOTE — Assessment & Plan Note (Addendum)
 Patient's BMI is under the threshold to treat with medicine for medical weight loss.  Patient has completed 3 months of phentermine  will take at least a 64-month break.  Can consider Qsymia in the future.  She will call us  if this is covered by her insurance.  Also can consider healthy weight wellness, if time allows.  Continue working on healthy lifestyle modifications inclusive of diet and exercise

## 2023-07-27 NOTE — Patient Instructions (Addendum)
 Nice to see you today  The name of the other medication is Qsymia  Need at least 3 months off the medication so beginning of April we can consider this other medication if it is covered

## 2023-07-28 ENCOUNTER — Other Ambulatory Visit: Payer: Self-pay | Admitting: Dermatology

## 2023-07-28 ENCOUNTER — Other Ambulatory Visit: Payer: Self-pay | Admitting: Nurse Practitioner

## 2023-07-28 DIAGNOSIS — K219 Gastro-esophageal reflux disease without esophagitis: Secondary | ICD-10-CM

## 2023-08-29 IMAGING — DX DG CHEST 2V
2 series · 2 of 2 positions shown · non-contrast
Comparison: Chest radiograph October 07, 2021

CLINICAL DATA: History of multifocal infection

EXAM:
CHEST - 2 VIEW

[chest pa]
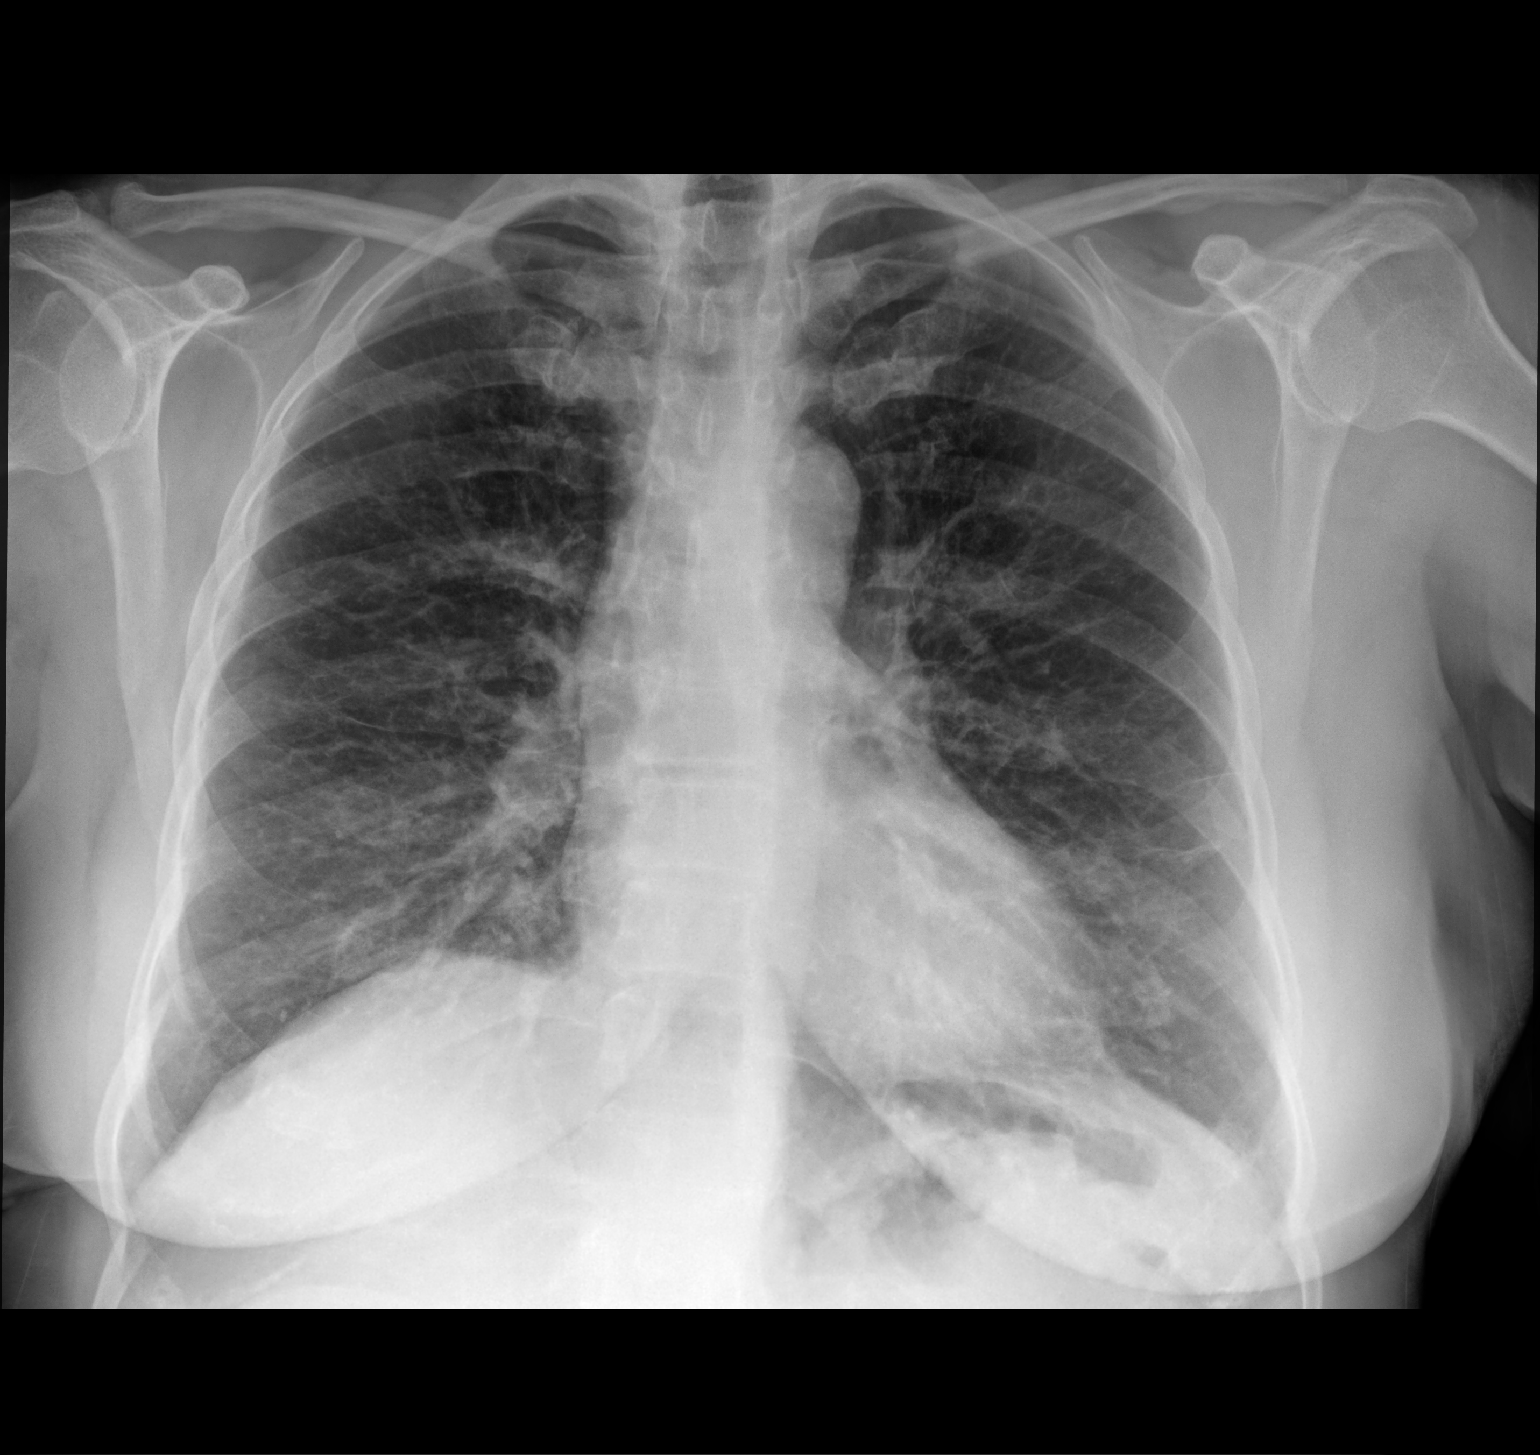

[chest lat]
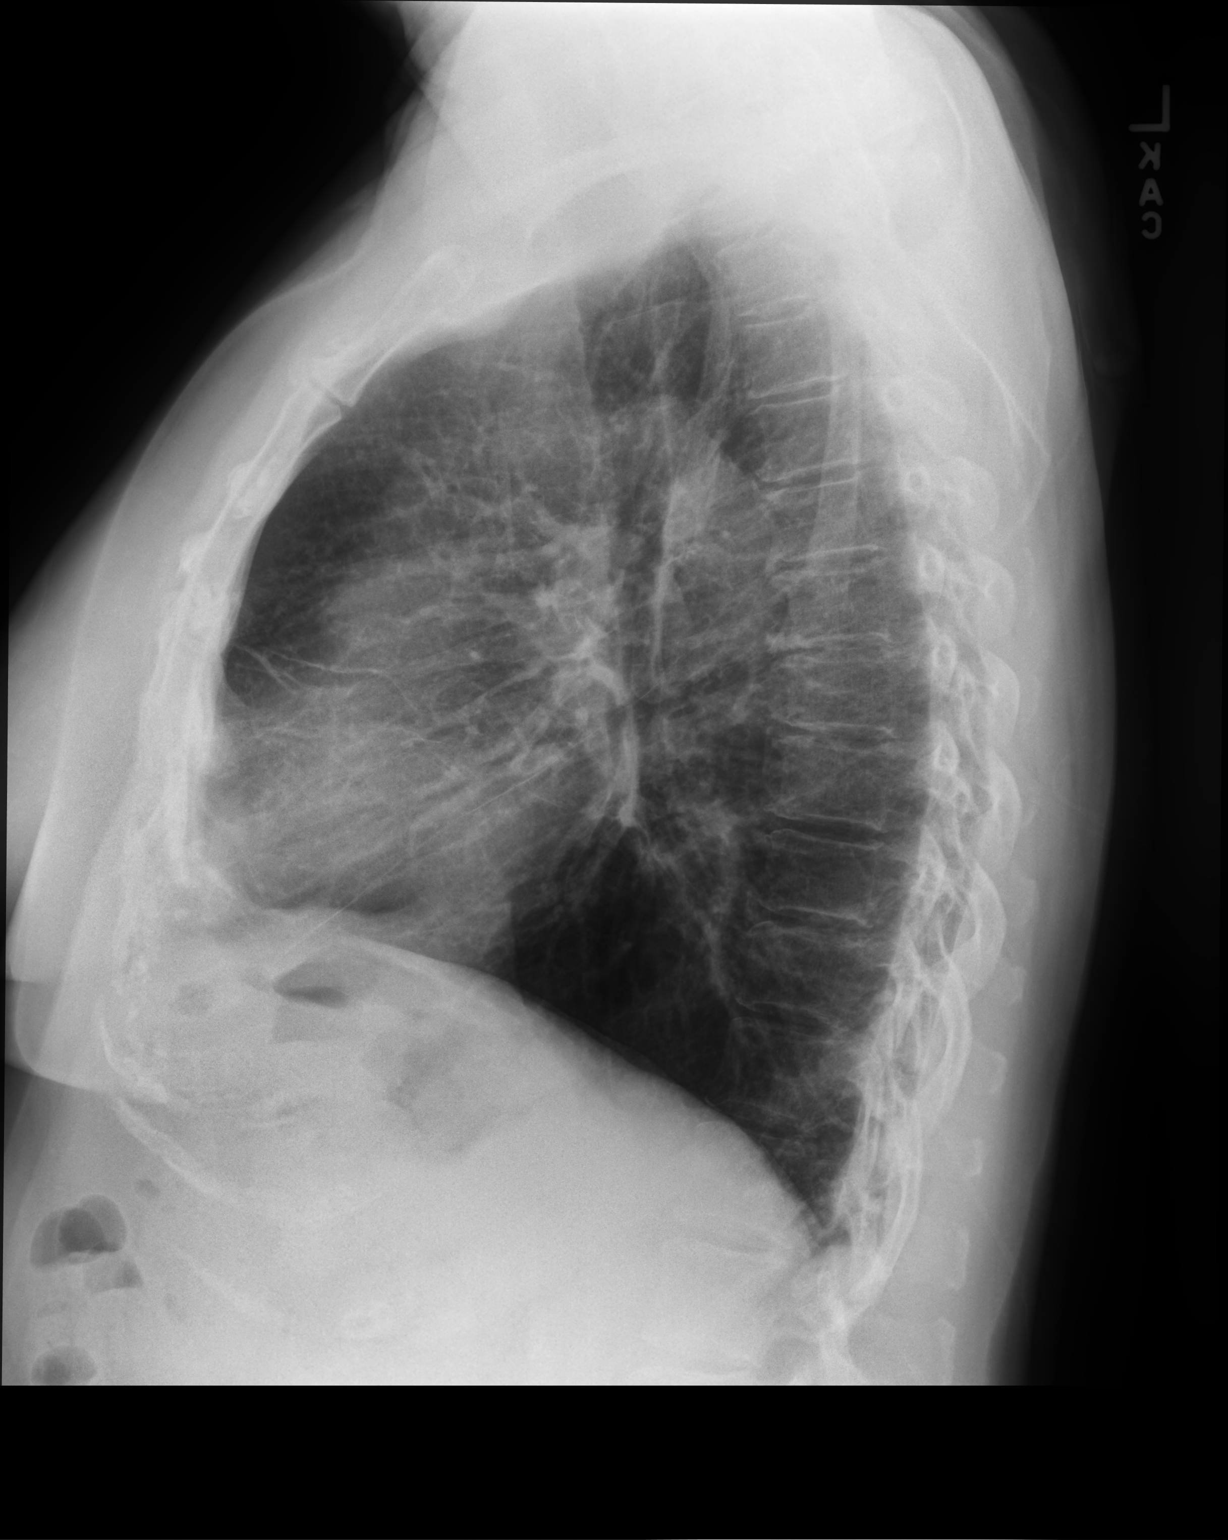

[2 of 2 positions shown; findings below may reference images not displayed]

FINDINGS: Stable cardiac and mediastinal contours. Interval improvement in
right-greater-than-left basilar airspace opacities. No pleural
effusion or pneumothorax. Thoracic spine degenerative changes.
IMPRESSION: Improved basilar opacities most compatible with improving infection.
Consider additional follow-up chest radiograph in 3-4 weeks to
ensure complete resolution.

## 2023-08-31 ENCOUNTER — Ambulatory Visit: Payer: 59 | Admitting: Dermatology

## 2023-09-01 ENCOUNTER — Ambulatory Visit: Payer: 59 | Admitting: Dermatology

## 2023-09-07 ENCOUNTER — Ambulatory Visit: Payer: 59 | Admitting: Dermatology

## 2023-10-12 ENCOUNTER — Ambulatory Visit: Payer: 59 | Admitting: Dermatology

## 2023-10-12 ENCOUNTER — Encounter: Payer: Self-pay | Admitting: Dermatology

## 2023-10-12 ENCOUNTER — Ambulatory Visit: Admitting: Dermatology

## 2023-10-12 DIAGNOSIS — Z1283 Encounter for screening for malignant neoplasm of skin: Secondary | ICD-10-CM | POA: Diagnosis not present

## 2023-10-12 DIAGNOSIS — Z79899 Other long term (current) drug therapy: Secondary | ICD-10-CM

## 2023-10-12 DIAGNOSIS — L578 Other skin changes due to chronic exposure to nonionizing radiation: Secondary | ICD-10-CM

## 2023-10-12 DIAGNOSIS — W908XXA Exposure to other nonionizing radiation, initial encounter: Secondary | ICD-10-CM | POA: Diagnosis not present

## 2023-10-12 DIAGNOSIS — L820 Inflamed seborrheic keratosis: Secondary | ICD-10-CM | POA: Diagnosis not present

## 2023-10-12 DIAGNOSIS — L700 Acne vulgaris: Secondary | ICD-10-CM

## 2023-10-12 DIAGNOSIS — Z85828 Personal history of other malignant neoplasm of skin: Secondary | ICD-10-CM

## 2023-10-12 DIAGNOSIS — L821 Other seborrheic keratosis: Secondary | ICD-10-CM

## 2023-10-12 DIAGNOSIS — D1801 Hemangioma of skin and subcutaneous tissue: Secondary | ICD-10-CM

## 2023-10-12 DIAGNOSIS — Z7189 Other specified counseling: Secondary | ICD-10-CM

## 2023-10-12 DIAGNOSIS — Z85820 Personal history of malignant melanoma of skin: Secondary | ICD-10-CM

## 2023-10-12 DIAGNOSIS — D229 Melanocytic nevi, unspecified: Secondary | ICD-10-CM

## 2023-10-12 DIAGNOSIS — L814 Other melanin hyperpigmentation: Secondary | ICD-10-CM

## 2023-10-12 DIAGNOSIS — Z86018 Personal history of other benign neoplasm: Secondary | ICD-10-CM

## 2023-10-12 DIAGNOSIS — Z86006 Personal history of melanoma in-situ: Secondary | ICD-10-CM

## 2023-10-12 DIAGNOSIS — B353 Tinea pedis: Secondary | ICD-10-CM

## 2023-10-12 MED ORDER — KETOCONAZOLE 2 % EX CREA: TOPICAL_CREAM | CUTANEOUS | 6 refills | Status: AC

## 2023-10-12 NOTE — Progress Notes (Signed)
 Follow-Up Visit   Subjective  Judy Huang is a 58 y.o. female who presents for the following: Skin Cancer Screening and Full Body Skin Exam, hx of Melanoma in situ, hx of BCC Acne on her face not getting any better treating with Retin A cream with a poor response.   Partial hysterectomy in 1997  The patient presents for Total-Body Skin Exam (TBSE) for skin cancer screening and mole check. The patient has spots, moles and lesions to be evaluated, some may be new or changing and the patient may have concern these could be cancer.  The following portions of the chart were reviewed this encounter and updated as appropriate: medications, allergies, medical history  Review of Systems:  No other skin or systemic complaints except as noted in HPI or Assessment and Plan.  Objective  Well appearing patient in no apparent distress; mood and affect are within normal limits.  A full examination was performed including scalp, head, eyes, ears, nose, lips, neck, chest, axillae, abdomen, back, buttocks, bilateral upper extremities, bilateral lower extremities, hands, feet, fingers, toes, fingernails, and toenails. All findings within normal limits unless otherwise noted below.   Relevant physical exam findings are noted in the Assessment and Plan.  left axilla x 1, left posterior axillary fold x 1, right posterior waistline x 1 (3) Stuck-on, waxy, tan-brown papule or plaque --Discussed benign etiology and prognosis.   Assessment & Plan   SKIN CANCER SCREENING PERFORMED TODAY.  ACTINIC DAMAGE - Chronic condition, secondary to cumulative UV/sun exposure - diffuse scaly erythematous macules with underlying dyspigmentation - Recommend daily broad spectrum sunscreen SPF 30+ to sun-exposed areas, reapply every 2 hours as needed.  - Staying in the shade or wearing long sleeves, sun glasses (UVA+UVB protection) and wide brim hats (4-inch brim around the entire circumference of the hat) are also  recommended for sun protection.  - Call for new or changing lesions.  LENTIGINES, SEBORRHEIC KERATOSES, HEMANGIOMAS - Benign normal skin lesions - Benign-appearing - Call for any changes  MELANOCYTIC NEVI - Tan-brown and/or pink-flesh-colored symmetric macules and papules - Benign appearing on exam today - Observation - Call clinic for new or changing moles - Recommend daily use of broad spectrum spf 30+ sunscreen to sun-exposed areas.   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 Condition is bothersome/symptomatic for patient. Currently flared.  Treatment Plan: Isotretinoin Counseling; Review and Contraception Counseling: Reviewed potential side effects of isotretinoin including xerosis, cheilitis, hepatitis, hyperlipidemia, and severe birth defects if taken by a pregnant woman.  Women on isotretinoin must be celibate (not having sex) or required to use at least 2 birth control methods to prevent pregnancy (unless patient is a female of non-child bearing potential).  Females of child-bearing potential must have monthly pregnancy tests while on isotretinoin and report through I-Pledge (FDA monitoring program). Reviewed reports of suicidal ideation in those with a history of depression while taking isotretinoin and reports of diagnosis of inflammatory bowl disease (IBD) while taking isotretinoin as well as the lack of evidence for a causal relationship between isotretinoin, depression and IBD. Patient advised to reach out with any questions or concerns. Patient advised not to share pills or donate blood while on treatment or for one month after completing treatment. All patient's considering Isotretinoin must read and understand and sign Isotretinoin Consent Form and be registered with I-Pledge.   Plan on starting Isotretinoin  Partial  hysterectomy in 1997 uterus removed   Labs  order [facility]  Labs reviewed dated 04/28/2023 WNL   D/C tretinoin cream   TINEA PEDIS Exam: Scaling and maceration web spaces and over distal and lateral soles. Chronic and persistent condition with duration or expected duration over one year. Condition is symptomatic/ bothersome to patient. Not currently at goal.  Treatment Plan: Start Ketoconazole cream apply to feet at bedtime    HISTORY OF DYSPLASTIC NEVUS No evidence of recurrence today Recommend regular full body skin exams Recommend daily broad spectrum sunscreen SPF 30+ to sun-exposed areas, reapply every 2 hours as needed.  Call if any new or changing lesions are noted between office visits    HISTORY OF BASAL CELL CARCINOMA OF THE SKIN L shoulder top of shoulder, L distal ant thigh, L upper back paraspinal, R infrascapula, R mid back braline  - No evidence of recurrence today - Recommend regular full body skin exams - Recommend daily broad spectrum sunscreen SPF 30+ to sun-exposed areas, reapply every 2 hours as needed.  - Call if any new or changing lesions are noted between office visits    HISTORY OF MELANOMA IN SITU Left post shoulder 11/24/2021 - No evidence of recurrence today - Recommend regular full body skin exams - Recommend daily broad spectrum sunscreen SPF 30+ to sun-exposed areas, reapply every 2 hours as needed.  - Call if any new or changing lesions are noted between office visits   INFLAMED SEBORRHEIC KERATOSIS (3) left axilla x 1, left posterior axillary fold x 1, right posterior waistline x 1 (3) Symptomatic, irritating, patient would like treated.  Destruction of lesion - left axilla x 1, left posterior axillary fold x 1, right posterior waistline x 1 (3) Complexity: simple   Destruction method: cryotherapy   Informed consent: discussed and consent obtained   Timeout:  patient name, date of birth, surgical site, and procedure verified Lesion destroyed using liquid nitrogen: Yes   Region frozen until  ice ball extended beyond lesion: Yes   Outcome: patient tolerated procedure well with no complications   Post-procedure details: wound care instructions given   ACNE VULGARIS   Related Procedures Luteinizing Hormone Follicle Stimulating Hormone   Return in about 4 weeks (around 11/09/2023) for Acne and TBSE in 6 months .  IAngelique Holm, CMA, am acting as scribe for Armida Sans, MD .   Documentation: I have reviewed the above documentation for accuracy and completeness, and I agree with the above.  Armida Sans, MD

## 2023-10-12 NOTE — Patient Instructions (Signed)

## 2023-10-13 ENCOUNTER — Telehealth: Payer: Self-pay

## 2023-10-13 ENCOUNTER — Encounter: Payer: Self-pay | Admitting: Dermatology

## 2023-10-13 LAB — LUTEINIZING HORMONE: LH: 47.1 m[IU]/mL

## 2023-10-13 LAB — FOLLICLE STIMULATING HORMONE: FSH: 77.6 m[IU]/mL

## 2023-10-13 MED ORDER — ISOTRETINOIN 20 MG PO CAPS: 20.0000 mg | ORAL_CAPSULE | Freq: Every day | ORAL | 0 refills | Status: AC

## 2023-10-13 NOTE — Telephone Encounter (Signed)
-----   Message from Armida Sans sent at 10/13/2023 11:45 AM EDT ----- Lab from 10/12/2023 shows LH and FSH hormones consistent with  POSTMENOPAUSAL STATUS.  May register pt in I-Pledge and start Isotretinoin 20 mg every day - send to pharmacy. Stop all other acne meds.

## 2023-10-13 NOTE — Telephone Encounter (Signed)
 Called pt discussed labs, Isotretinoin 20 mg #30 0RF sent to [facility]

## 2023-10-13 NOTE — Telephone Encounter (Signed)
 Patient register in I Pledge  [phone]  Isotretinoin 20 mg #30 sent to

## 2023-10-18 ENCOUNTER — Other Ambulatory Visit: Payer: Self-pay

## 2023-10-18 NOTE — Progress Notes (Unsigned)
 Patient called the pharmacy need her Ipledge number

## 2023-10-29 ENCOUNTER — Other Ambulatory Visit: Payer: Self-pay | Admitting: Nurse Practitioner

## 2023-10-29 DIAGNOSIS — E7800 Pure hypercholesterolemia, unspecified: Secondary | ICD-10-CM

## 2023-11-06 ENCOUNTER — Other Ambulatory Visit: Payer: Self-pay | Admitting: Nurse Practitioner

## 2023-11-06 DIAGNOSIS — K219 Gastro-esophageal reflux disease without esophagitis: Secondary | ICD-10-CM

## 2023-11-16 ENCOUNTER — Ambulatory Visit: Admitting: Dermatology

## 2024-02-05 ENCOUNTER — Other Ambulatory Visit: Payer: Self-pay | Admitting: Nurse Practitioner

## 2024-02-05 DIAGNOSIS — K219 Gastro-esophageal reflux disease without esophagitis: Secondary | ICD-10-CM

## 2024-03-27 ENCOUNTER — Other Ambulatory Visit: Payer: Self-pay | Admitting: Nurse Practitioner

## 2024-03-27 ENCOUNTER — Other Ambulatory Visit: Payer: Self-pay | Admitting: Dermatology

## 2024-03-27 DIAGNOSIS — E7800 Pure hypercholesterolemia, unspecified: Secondary | ICD-10-CM

## 2024-03-27 DIAGNOSIS — N951 Menopausal and female climacteric states: Secondary | ICD-10-CM

## 2024-03-27 NOTE — Telephone Encounter (Signed)
 Patient is due for a CPE on 04/28/2024 or within 30 days after. Can we get her scheduled in order for her to continue getting refills

## 2024-03-28 NOTE — Telephone Encounter (Signed)
 I spoked with patent and she says she will give a call back of when she can scheduled her physical appt but as of right now, with her father being in the hospital she is unable to.

## 2024-04-12 NOTE — Telephone Encounter (Signed)
 Lvm to schedule

## 2024-05-15 ENCOUNTER — Ambulatory Visit (INDEPENDENT_AMBULATORY_CARE_PROVIDER_SITE_OTHER): Admitting: Internal Medicine

## 2024-05-15 ENCOUNTER — Encounter: Payer: Self-pay | Admitting: Internal Medicine

## 2024-05-15 ENCOUNTER — Ambulatory Visit

## 2024-05-15 VITALS — BP 110/88 | HR 92 | Temp 97.8°F | Ht 65.0 in | Wt 170.4 lb

## 2024-05-15 DIAGNOSIS — R059 Cough, unspecified: Secondary | ICD-10-CM

## 2024-05-15 DIAGNOSIS — H6691 Otitis media, unspecified, right ear: Secondary | ICD-10-CM

## 2024-05-15 DIAGNOSIS — R051 Acute cough: Secondary | ICD-10-CM

## 2024-05-15 DIAGNOSIS — H6690 Otitis media, unspecified, unspecified ear: Secondary | ICD-10-CM | POA: Insufficient documentation

## 2024-05-15 DIAGNOSIS — R062 Wheezing: Secondary | ICD-10-CM

## 2024-05-15 DIAGNOSIS — J209 Acute bronchitis, unspecified: Secondary | ICD-10-CM

## 2024-05-15 DIAGNOSIS — J440 Chronic obstructive pulmonary disease with acute lower respiratory infection: Secondary | ICD-10-CM

## 2024-05-15 DIAGNOSIS — J029 Acute pharyngitis, unspecified: Secondary | ICD-10-CM

## 2024-05-15 LAB — POCT INFLUENZA A/B
Influenza A, POC: NEGATIVE
Influenza B, POC: NEGATIVE

## 2024-05-15 LAB — POC COVID19 BINAXNOW: SARS Coronavirus 2 Ag: NEGATIVE

## 2024-05-15 LAB — POCT RAPID STREP A (OFFICE): Rapid Strep A Screen: NEGATIVE

## 2024-05-15 MED ORDER — AMOXICILLIN-POT CLAVULANATE 875-125 MG PO TABS: 1.0000 | ORAL_TABLET | Freq: Two times a day (BID) | ORAL | 0 refills | Status: AC

## 2024-05-15 MED ORDER — METHYLPREDNISOLONE ACETATE 40 MG/ML IJ SUSP
40.0000 mg | Freq: Once | INTRAMUSCULAR | Status: AC
  Administered 2024-05-15: 40 mg via INTRAMUSCULAR

## 2024-05-15 MED ORDER — PREDNISONE 10 MG PO TABS: ORAL_TABLET | ORAL | 0 refills | Status: AC

## 2024-05-15 MED ORDER — ALBUTEROL SULFATE HFA 108 (90 BASE) MCG/ACT IN AERS: 2.0000 | INHALATION_SPRAY | RESPIRATORY_TRACT | 3 refills | Status: AC | PRN

## 2024-05-15 NOTE — Patient Instructions (Signed)
 You have  otitis media , likely because of  chronic sinus congestion  I recommend a round of antibiotics to make sure the otitis does not progress,  Along with a  Shot of prednisone  and a 6 day tapering dose to deal with the inflammation.  Judy Huang PLEASE USE AFRIN TWICE DAILY FOR 3 DAYS, THEN ONCE DAILY FOR 2 DAYS, THEN STOP  I will add a 2nd antibiotic if your chest x ray indicates pneumonia    Daily use of Probiotics for  3 weeks advised to reduce risk of C dificile colitis.  This includes yogurt,  Especially Activia one serving daily    SINCE THIS HAPPENS EVERY FALL, I RECOMMEND DAILY USE OF GENERIC ALLEGRA (FEXOFENADINE 180 MG ) FROM SEPTEMBER TO NOVEMBER TO PREVENT ALLERGIC RHINITIS FROM TURNING INTO SINUSITIS AND OTITIS

## 2024-05-15 NOTE — Assessment & Plan Note (Signed)
 Right sided,  ue to prolonged sinus congestion .  Empiric augmentin  and prednisone 

## 2024-05-15 NOTE — Progress Notes (Signed)
 Subjective:  Patient ID: Judy Huang, female    DOB: 11-17-1965  Age: 58 y.o. MRN: [id number]  CC: The primary encounter diagnosis was Wheeze. Diagnoses of Acute cough, Sore throat, Acute bronchitis with COPD (HCC), Right otitis media, unspecified otitis media type, and Cough were also pertinent to this visit.   HPI Judy Huang presents for  Chief Complaint  Patient presents with   Cough    Cough, congestion, headache, chest congestion, right ear pain, right side facial pain, right side throat pain. X 2 days    Judy Huang is a 58 yr old former smoker  with a history of allergic rhinitis  who presents with right sided facial pain, ear pain and pharyngitis of 2 days duration.  Symptoms started on Saturday with right ear pain and right maxillary sinus pain , headache,  sinus congestion and bronchitis  after spending Friday outside.  Symptoms have been progressing  .  Coughing during the night.  Using nyquil  and cough drops.  And ibuprofen .  His cough is productive , sputum is clear.    She states that her allergies kick up  every Fall,  but she is not using any steroid nasal sprays , topical or oral antihistamines.   Outpatient Medications Prior to Visit  Medication Sig Dispense Refill   ascorbic acid (VITAMIN C) 500 MG tablet Take 500 mg by mouth daily.     aspirin EC 81 MG tablet Take 81 mg by mouth daily.     clobetasol  cream (TEMOVATE ) 0.05 % APPLY 1 APPLICATION TOPICALLY 2 (TWO) TIMES DAILY. APPLY TO HANDS AND FEET 3 DAYS A WEEK FOR ECZEMA, PRN FLARES, AVOID FACE, GROIN, AXILLA 45 g 3   fluticasone  (FLONASE ) 50 MCG/ACT nasal spray SPRAY 2 SPRAYS INTO EACH NOSTRIL EVERY DAY 16 mL 6   gabapentin  (NEURONTIN ) 100 MG capsule TAKE 2 CAPSULES BY MOUTH AT NIGHT 60 capsule 1   hydrochlorothiazide  (MICROZIDE ) 12.5 MG capsule TAKE 1 CAPSULE BY MOUTH EVERY DAY 30 capsule 8   ketoconazole  (NIZORAL ) 2 % cream Apply to feet at bedtime 60 g 6   MAGNESIUM PO Take by mouth.     Multiple  Vitamin (MULTIVITAMIN WITH MINERALS) TABS tablet Take 1 tablet by mouth daily.     mupirocin  ointment (BACTROBAN ) 2 % Apply to skin qd-bid 22 g 0   OVER THE COUNTER MEDICATION Muscle cramp formula , juice plus Omega blend.     pantoprazole  (PROTONIX ) 20 MG tablet Take 1 tablet (20 mg total) by mouth daily. 90 tablet 0   phentermine  37.5 MG capsule Take 1 capsule (37.5 mg total) by mouth every morning. 30 capsule 0   rosuvastatin  (CRESTOR ) 5 MG tablet TAKE 1 TABLET (5 MG TOTAL) BY MOUTH DAILY. 90 tablet 0   Ruxolitinib Phosphate  (OPZELURA ) 1.5 % CREA Apply 1 Application topically as directed. Qd to bid aa hands and feet, prn eczema flares 60 g 6   vitamin B-12 (CYANOCOBALAMIN) 1000 MCG tablet Take 1,000 mcg by mouth daily.     albuterol  (VENTOLIN  HFA) 108 (90 Base) MCG/ACT inhaler Inhale 2 puffs into the lungs every 4 (four) hours as needed for wheezing or shortness of breath (cough, shortness of breath or wheezing.). 6.7 g 1   Tralokinumab -ldrm ([name] ) 150 MG/ML SOSY Inject 2 mLs (300 mg total) into the skin every 14 (fourteen) days. Starting on day 15 for maintenance. 4 mL 0   No facility-administered medications prior to visit.    Review of Systems;  Patient denies headache, fevers, malaise, unintentional weight loss, skin rash, eye pain, sinus congestion and sinus pain, sore throat, dysphagia,  hemoptysis , cough, dyspnea, wheezing, chest pain, palpitations, orthopnea, edema, abdominal pain, nausea, melena, diarrhea, constipation, flank pain, dysuria, hematuria, urinary  Frequency, nocturia, numbness, tingling, seizures,  Focal weakness, Loss of consciousness,  Tremor, insomnia, depression, anxiety, and suicidal ideation.      Objective:  BP 110/88   Pulse 92   Temp 97.8 F (36.6 C) (Oral)   Ht 5' 5 (1.651 m)   Wt 170 lb 6.4 oz (77.3 kg)   SpO2 95%   BMI 28.36 kg/m   BP Readings from Last 3 Encounters:  05/15/24 110/88  07/27/23 124/82  06/25/23 118/88    Wt Readings from  Last 3 Encounters:  05/15/24 170 lb 6.4 oz (77.3 kg)  07/27/23 157 lb 9.6 oz (71.5 kg)  06/25/23 159 lb (72.1 kg)    Physical Exam Vitals reviewed.  Constitutional:      General: She is not in acute distress.    Appearance: Normal appearance. She is normal weight. She is not ill-appearing, toxic-appearing or diaphoretic.  HENT:     Head: Normocephalic.     Ears:     Comments: Bilateral bulging TMS , R > L  Eyes:     General: No scleral icterus.       Right eye: No discharge.        Left eye: No discharge.     Conjunctiva/sclera: Conjunctivae normal.  Cardiovascular:     Rate and Rhythm: Normal rate.  Pulmonary:     Effort: Pulmonary effort is normal.     Breath sounds: Rales present.  Musculoskeletal:        General: Normal range of motion.  Skin:    General: Skin is warm and dry.  Neurological:     General: No focal deficit present.     Mental Status: She is alert and oriented to person, place, and time. Mental status is at baseline.  Psychiatric:        Mood and Affect: Mood normal.        Behavior: Behavior normal.        Thought Content: Thought content normal.        Judgment: Judgment normal.     Lab Results  Component Value Date   HGBA1C 5.9 04/28/2023   HGBA1C 6.2 04/29/2022   HGBA1C 5.9 08/19/2021    Lab Results  Component Value Date   CREATININE 0.72 04/28/2023   CREATININE 0.89 04/29/2022   CREATININE 0.90 08/19/2021    Lab Results  Component Value Date   WBC 5.7 04/28/2023   HGB 14.1 04/28/2023   HCT 43.1 04/28/2023   PLT 337.0 04/28/2023   GLUCOSE 88 04/28/2023   CHOL 165 04/28/2023   TRIG 111.0 04/28/2023   HDL 46.10 04/28/2023   LDLDIRECT 174.0 04/29/2022   LDLCALC 97 04/28/2023   ALT 31 04/28/2023   AST 27 04/28/2023   NA 140 04/28/2023   K 4.3 04/28/2023   CL 102 04/28/2023   CREATININE 0.72 04/28/2023   BUN 15 04/28/2023   CO2 29 04/28/2023   TSH 1.97 04/28/2023   HGBA1C 5.9 04/28/2023    MM 3D SCREENING MAMMOGRAM  BILATERAL BREAST Result Date: 04/29/2023 CLINICAL DATA:  Screening. EXAM: DIGITAL SCREENING BILATERAL MAMMOGRAM WITH TOMOSYNTHESIS AND CAD TECHNIQUE: Bilateral screening digital craniocaudal and mediolateral oblique mammograms were obtained. Bilateral screening digital breast tomosynthesis was performed. The images were evaluated with computer-aided detection.  COMPARISON:  Previous exam(s). ACR Breast Density Category b: There are scattered areas of fibroglandular density. FINDINGS: There are no findings suspicious for malignancy. IMPRESSION: No mammographic evidence of malignancy. A result letter of this screening mammogram will be mailed directly to the patient. RECOMMENDATION: Screening mammogram in one year. (Code:SM-B-01Y) BI-RADS CATEGORY  1: Negative. Electronically Signed   By: Alm Parkins M.D.   On: 04/29/2023 12:13    Assessment & Plan:  .Wheeze -     Albuterol  Sulfate HFA; Inhale 2 puffs into the lungs every 4 (four) hours as needed for wheezing or shortness of breath (cough, shortness of breath or wheezing.).  Dispense: 6.7 g; Refill: 3 -     DG Chest 2 View; Future  Acute cough Assessment & Plan: POC COVID And FLU negative.  In the setting of right sided otitis media in a nonsmoker,  empiric augmentin /prednisone /albuterol  and will add 2nd ab if todays' chest x ray is positive for infiltrate  Orders: -     POCT Influenza A/B -     POCT rapid strep A -     POC COVID-19 BinaxNow  Sore throat -     POCT rapid strep A  Acute bronchitis with COPD (HCC) -     methylPREDNISolone  Acetate  Right otitis media, unspecified otitis media type Assessment & Plan: Right sided,  ue to prolonged sinus congestion .  Empiric augmentin  and prednisone   Orders: -     predniSONE ; 6 tablets on Day 1 , then reduce by 1 tablet daily until gone  Dispense: 21 tablet; Refill: 0 -     Amoxicillin -Pot Clavulanate; Take 1 tablet by mouth 2 (two) times daily.  Dispense: 14 tablet; Refill: 0  Cough -      Albuterol  Sulfate HFA; Inhale 2 puffs into the lungs every 4 (four) hours as needed for wheezing or shortness of breath (cough, shortness of breath or wheezing.).  Dispense: 6.7 g; Refill: 3 -     DG Chest 2 View; Future      Follow-up: No follow-ups on file.   Verneita LITTIE Kettering, MD

## 2024-05-15 NOTE — Assessment & Plan Note (Signed)
 POC COVID And FLU negative.  In the setting of right sided otitis media in a nonsmoker,  empiric augmentin /prednisone /albuterol  and will add 2nd ab if todays' chest x ray is positive for infiltrate

## 2024-05-17 ENCOUNTER — Ambulatory Visit: Payer: Self-pay | Admitting: Internal Medicine

## 2024-06-24 ENCOUNTER — Other Ambulatory Visit: Payer: Self-pay | Admitting: Nurse Practitioner

## 2024-06-24 DIAGNOSIS — K219 Gastro-esophageal reflux disease without esophagitis: Secondary | ICD-10-CM
